# Patient Record
Sex: Male | Born: 1935 | Race: White | Hispanic: No | Marital: Single | Smoking: Former smoker
Health system: Southern US, Community
[De-identification: ages and names within clinical notes are randomized; demographics above are authoritative.]

## PROBLEM LIST (undated history)

## (undated) DIAGNOSIS — H409 Unspecified glaucoma: Secondary | ICD-10-CM

## (undated) DIAGNOSIS — I4891 Unspecified atrial fibrillation: Secondary | ICD-10-CM

## (undated) DIAGNOSIS — I10 Essential (primary) hypertension: Secondary | ICD-10-CM

## (undated) DIAGNOSIS — I499 Cardiac arrhythmia, unspecified: Secondary | ICD-10-CM

## (undated) DIAGNOSIS — M1990 Unspecified osteoarthritis, unspecified site: Secondary | ICD-10-CM

## (undated) HISTORY — DX: Unspecified atrial fibrillation: I48.91

## (undated) HISTORY — DX: Essential (primary) hypertension: I10

## (undated) HISTORY — PX: CATARACT EXTRACTION: SUR2

## (undated) HISTORY — PX: CARPAL TUNNEL RELEASE: SHX101

---

## 2008-06-10 ENCOUNTER — Emergency Department (HOSPITAL_COMMUNITY): Admission: EM | Admit: 2008-06-10 | Discharge: 2008-06-10 | Payer: Self-pay | Admitting: Emergency Medicine

## 2008-06-24 ENCOUNTER — Encounter: Admission: RE | Admit: 2008-06-24 | Discharge: 2008-06-24 | Payer: Self-pay | Admitting: Family Medicine

## 2010-07-23 LAB — POCT I-STAT, CHEM 8
Calcium, Ion: 1.18 mmol/L (ref 1.12–1.32)
Chloride: 104 mEq/L (ref 96–112)
HCT: 45 % (ref 39.0–52.0)
Hemoglobin: 15.3 g/dL (ref 13.0–17.0)
Potassium: 4 mEq/L (ref 3.5–5.1)

## 2010-07-23 LAB — DIFFERENTIAL
Basophils Absolute: 0 10*3/uL (ref 0.0–0.1)
Basophils Relative: 1 % (ref 0–1)
Eosinophils Absolute: 0.1 10*3/uL (ref 0.0–0.7)
Neutro Abs: 3.2 10*3/uL (ref 1.7–7.7)
Neutrophils Relative %: 62 % (ref 43–77)

## 2010-07-23 LAB — URINALYSIS, ROUTINE W REFLEX MICROSCOPIC
Bilirubin Urine: NEGATIVE
Ketones, ur: NEGATIVE mg/dL
Nitrite: NEGATIVE
Protein, ur: NEGATIVE mg/dL
Urobilinogen, UA: 0.2 mg/dL (ref 0.0–1.0)

## 2010-07-23 LAB — CBC
MCHC: 34.9 g/dL (ref 30.0–36.0)
MCV: 89.8 fL (ref 78.0–100.0)
RDW: 13.5 % (ref 11.5–15.5)

## 2010-10-14 IMAGING — US US CAROTID DUPLEX BILAT
1 series · 13 of 24 positions shown · non-contrast
Comparison: None.

CLINICAL DATA: Occasional dizziness, hypertension, remote history
of tobacco use

BILATERAL CAROTID DUPLEX ULTRASOUND
TECHNIQUE: Gray scale imaging, color Doppler and duplex ultrasound
was performed of bilateral carotid and vertebral arteries in the
neck.

[Series 1: us carotid duplex bilat · 0.08mm/px · 13 of 53 slices shown]
[im 1/53]
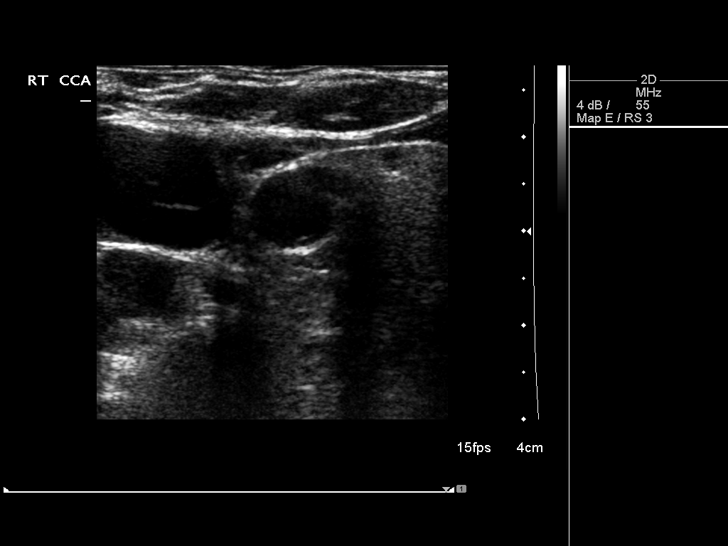
[im 5/53]
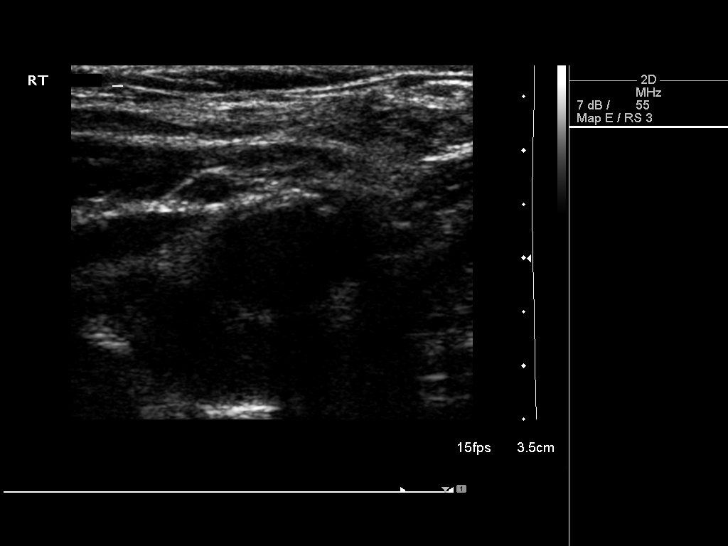
[im 10/53]
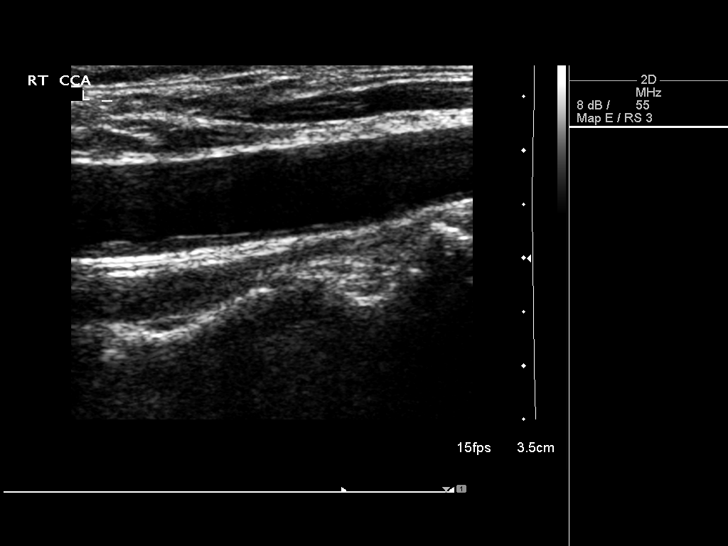
[im 14/53]
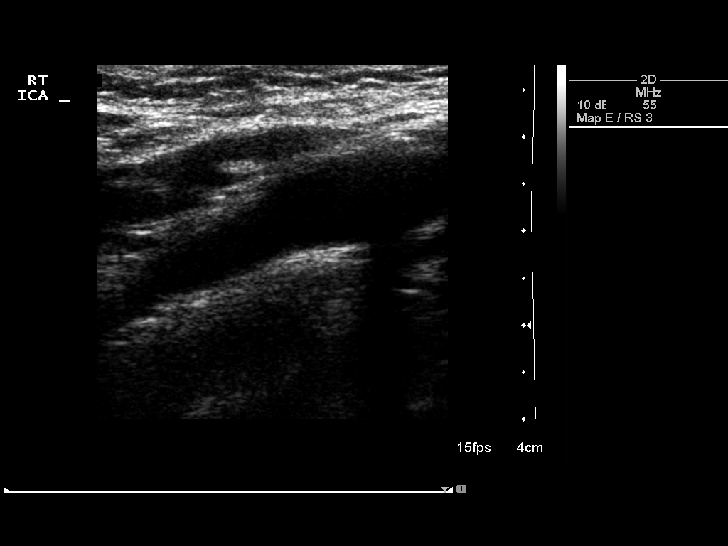
[im 19/53]
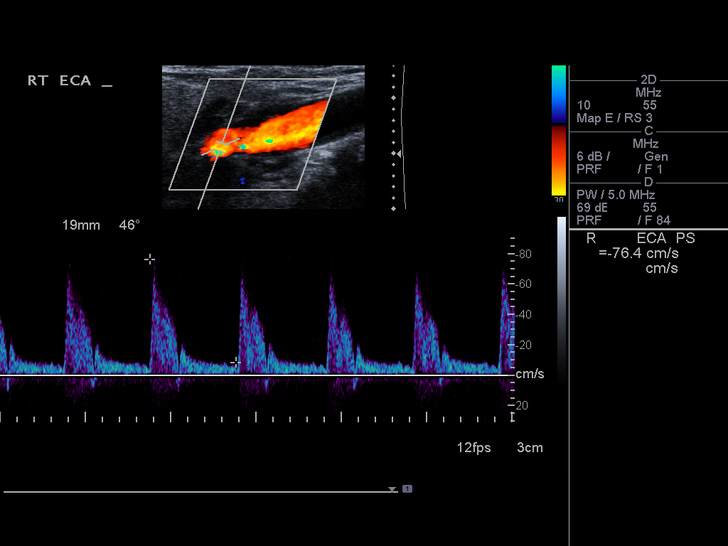
[im 23/53]
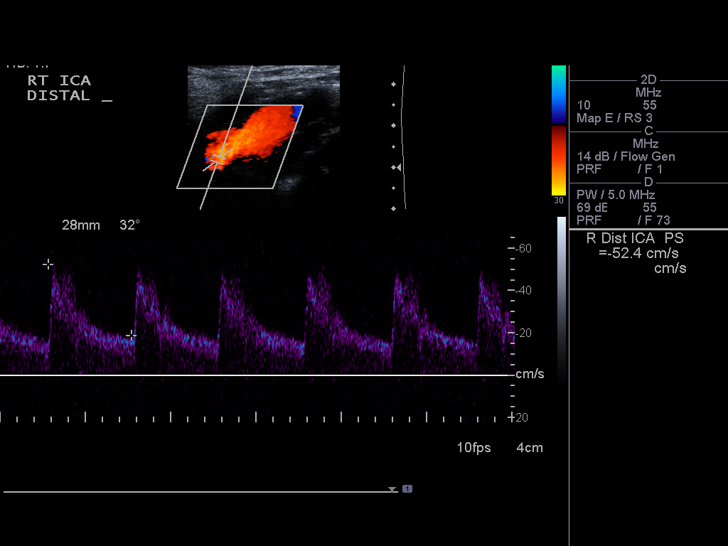
[im 28/53]
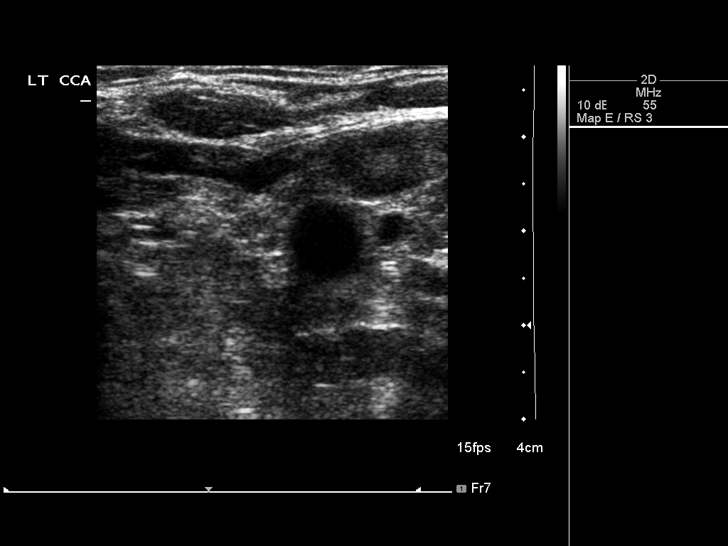
[im 30/53]
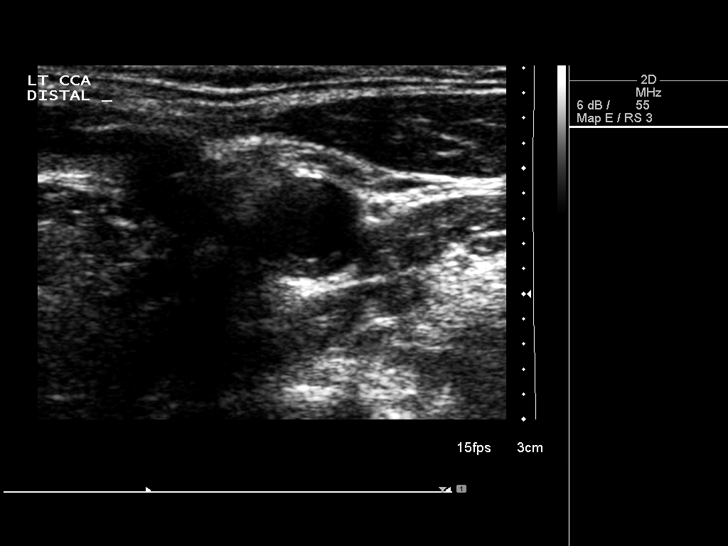
[im 34/53]
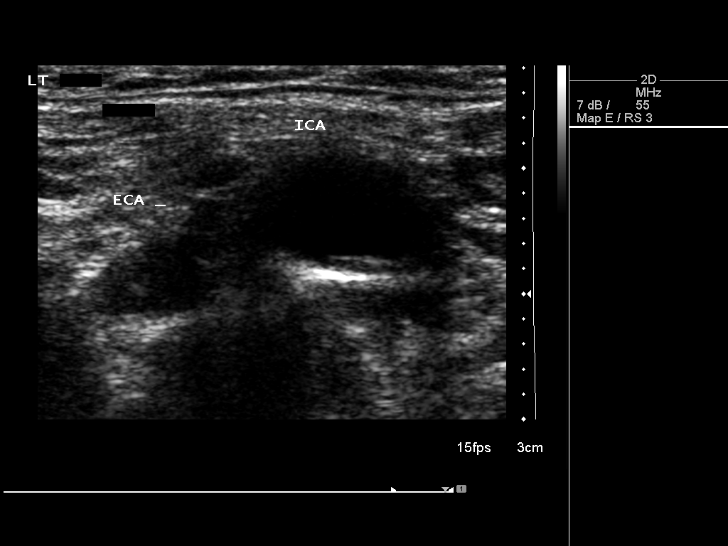
[im 39/53]
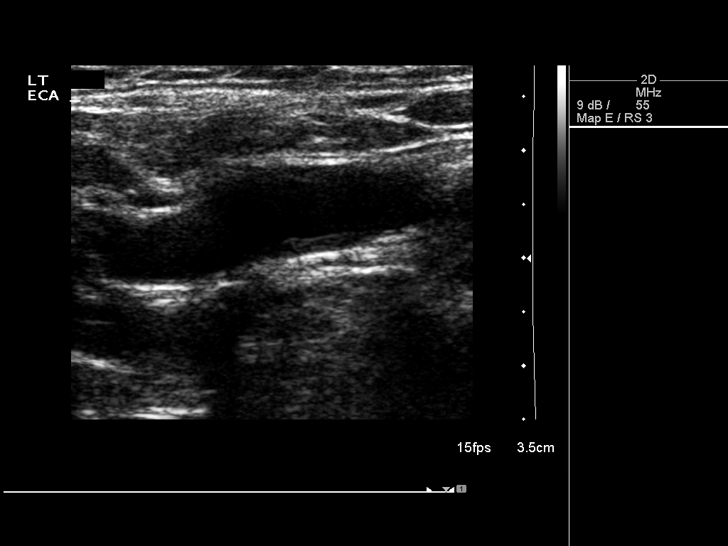
[im 43/53]
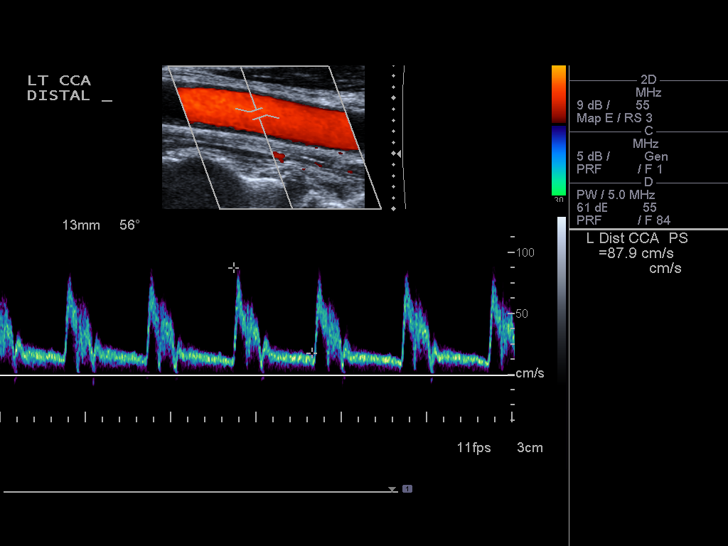
[im 48/53]
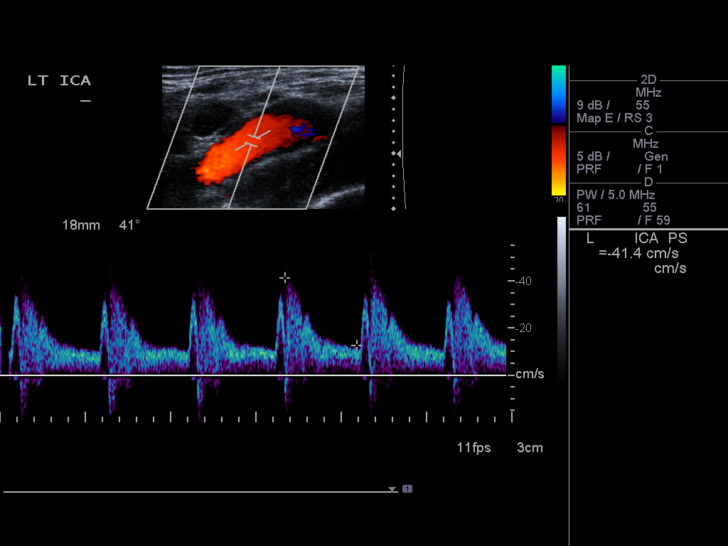
[im 53/53]
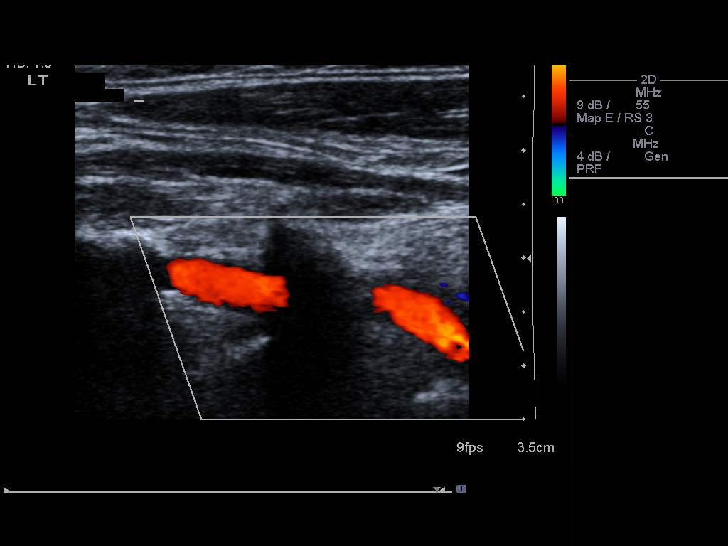

[13 of 24 positions shown; findings below may reference images not displayed]

Criteria:  Quantification of carotid stenosis is based on velocity
parameters that correlate the residual internal carotid diameter
with NASCET-based stenosis levels, using the diameter of the distal
internal carotid lumen as the denominator for stenosis measurement.

The following velocity measurements were obtained:

                 PEAK SYSTOLIC/END DIASTOLIC
RIGHT
ICA:                        60/19cm/sec
CCA:                        118/16cm/sec
SYSTOLIC ICA/CCA RATIO:
DIASTOLIC ICA/CCA RATIO:
ECA:                        76/8cm/sec

LEFT
ICA:                        57/19cm/sec
CCA:                        100/16cm/sec
SYSTOLIC ICA/CCA RATIO:
DIASTOLIC ICA/CCA RATIO:
ECA:                        48/8cm/sec
FINDINGS: RIGHT CAROTID ARTERY: Very mild right carotid bifurcation intimal
thickening and echogenic plaque formation.  No significant velocity
elevation detected.  By velocity and ratio criteria as well as gray-
scale imaging, there is no hemodynamically significant right ICA
stenosis.  Degree of narrowing is less than 50%.  Wave form
analysis demonstrates no turbulent flow or spectral broadening.

RIGHT VERTEBRAL ARTERY:  Antegrade

LEFT CAROTID ARTERY: Very minimal left carotid bifurcation intimal
thickening.  No elevated velocities detected.  By velocity and
ratio criteria as well as gray-scale imaging, there is no
hemodynamically significant left ICA stenosis.  Degree of narrowing
is also less than 50%.  Wave form analysis demonstrates no
turbulent flow or spectral broadening.

LEFT VERTEBRAL ARTERY:  Antegrade
IMPRESSION: Mild bilateral carotid atherosclerosis and intimal thickening.  No
hemodynamically significant ICA stenosis on either side.

## 2015-09-04 DIAGNOSIS — I10 Essential (primary) hypertension: Secondary | ICD-10-CM | POA: Diagnosis not present

## 2015-09-04 DIAGNOSIS — E663 Overweight: Secondary | ICD-10-CM | POA: Diagnosis not present

## 2015-09-04 DIAGNOSIS — E7800 Pure hypercholesterolemia, unspecified: Secondary | ICD-10-CM | POA: Diagnosis not present

## 2015-09-04 DIAGNOSIS — Z6829 Body mass index (BMI) 29.0-29.9, adult: Secondary | ICD-10-CM | POA: Diagnosis not present

## 2015-09-04 DIAGNOSIS — Z139 Encounter for screening, unspecified: Secondary | ICD-10-CM | POA: Diagnosis not present

## 2015-09-04 DIAGNOSIS — N529 Male erectile dysfunction, unspecified: Secondary | ICD-10-CM | POA: Diagnosis not present

## 2015-09-04 DIAGNOSIS — Z9181 History of falling: Secondary | ICD-10-CM | POA: Diagnosis not present

## 2015-09-04 DIAGNOSIS — Z125 Encounter for screening for malignant neoplasm of prostate: Secondary | ICD-10-CM | POA: Diagnosis not present

## 2016-02-06 DIAGNOSIS — H26492 Other secondary cataract, left eye: Secondary | ICD-10-CM | POA: Diagnosis not present

## 2016-02-06 DIAGNOSIS — H52203 Unspecified astigmatism, bilateral: Secondary | ICD-10-CM | POA: Diagnosis not present

## 2016-02-06 DIAGNOSIS — Z961 Presence of intraocular lens: Secondary | ICD-10-CM | POA: Diagnosis not present

## 2016-02-06 DIAGNOSIS — D3132 Benign neoplasm of left choroid: Secondary | ICD-10-CM | POA: Diagnosis not present

## 2016-03-26 DIAGNOSIS — Z1389 Encounter for screening for other disorder: Secondary | ICD-10-CM | POA: Diagnosis not present

## 2016-03-26 DIAGNOSIS — Z0000 Encounter for general adult medical examination without abnormal findings: Secondary | ICD-10-CM | POA: Diagnosis not present

## 2016-03-26 DIAGNOSIS — E7800 Pure hypercholesterolemia, unspecified: Secondary | ICD-10-CM | POA: Diagnosis not present

## 2016-03-26 DIAGNOSIS — I10 Essential (primary) hypertension: Secondary | ICD-10-CM | POA: Diagnosis not present

## 2016-10-01 DIAGNOSIS — Z139 Encounter for screening, unspecified: Secondary | ICD-10-CM | POA: Diagnosis not present

## 2016-10-01 DIAGNOSIS — E7800 Pure hypercholesterolemia, unspecified: Secondary | ICD-10-CM | POA: Diagnosis not present

## 2016-10-01 DIAGNOSIS — Z6831 Body mass index (BMI) 31.0-31.9, adult: Secondary | ICD-10-CM | POA: Diagnosis not present

## 2016-10-01 DIAGNOSIS — N529 Male erectile dysfunction, unspecified: Secondary | ICD-10-CM | POA: Diagnosis not present

## 2016-10-01 DIAGNOSIS — I10 Essential (primary) hypertension: Secondary | ICD-10-CM | POA: Diagnosis not present

## 2016-10-01 DIAGNOSIS — Z9181 History of falling: Secondary | ICD-10-CM | POA: Diagnosis not present

## 2016-11-03 DIAGNOSIS — Z9181 History of falling: Secondary | ICD-10-CM | POA: Diagnosis not present

## 2016-11-03 DIAGNOSIS — Z0000 Encounter for general adult medical examination without abnormal findings: Secondary | ICD-10-CM | POA: Diagnosis not present

## 2016-11-03 DIAGNOSIS — Z6831 Body mass index (BMI) 31.0-31.9, adult: Secondary | ICD-10-CM | POA: Diagnosis not present

## 2016-11-03 DIAGNOSIS — E785 Hyperlipidemia, unspecified: Secondary | ICD-10-CM | POA: Diagnosis not present

## 2016-11-03 DIAGNOSIS — Z125 Encounter for screening for malignant neoplasm of prostate: Secondary | ICD-10-CM | POA: Diagnosis not present

## 2016-11-03 DIAGNOSIS — Z1389 Encounter for screening for other disorder: Secondary | ICD-10-CM | POA: Diagnosis not present

## 2016-11-09 DIAGNOSIS — M170 Bilateral primary osteoarthritis of knee: Secondary | ICD-10-CM | POA: Diagnosis not present

## 2016-12-14 DIAGNOSIS — M542 Cervicalgia: Secondary | ICD-10-CM | POA: Diagnosis not present

## 2016-12-14 DIAGNOSIS — G5601 Carpal tunnel syndrome, right upper limb: Secondary | ICD-10-CM | POA: Diagnosis not present

## 2016-12-14 DIAGNOSIS — S46011A Strain of muscle(s) and tendon(s) of the rotator cuff of right shoulder, initial encounter: Secondary | ICD-10-CM | POA: Diagnosis not present

## 2017-01-13 DIAGNOSIS — G5601 Carpal tunnel syndrome, right upper limb: Secondary | ICD-10-CM | POA: Diagnosis not present

## 2017-01-19 DIAGNOSIS — I10 Essential (primary) hypertension: Secondary | ICD-10-CM | POA: Diagnosis not present

## 2017-02-11 DIAGNOSIS — H26492 Other secondary cataract, left eye: Secondary | ICD-10-CM | POA: Diagnosis not present

## 2017-02-11 DIAGNOSIS — D3132 Benign neoplasm of left choroid: Secondary | ICD-10-CM | POA: Diagnosis not present

## 2017-03-17 DIAGNOSIS — H26492 Other secondary cataract, left eye: Secondary | ICD-10-CM | POA: Diagnosis not present

## 2017-04-28 DIAGNOSIS — G5601 Carpal tunnel syndrome, right upper limb: Secondary | ICD-10-CM | POA: Diagnosis not present

## 2017-07-22 DIAGNOSIS — E7800 Pure hypercholesterolemia, unspecified: Secondary | ICD-10-CM | POA: Diagnosis not present

## 2017-07-22 DIAGNOSIS — I10 Essential (primary) hypertension: Secondary | ICD-10-CM | POA: Diagnosis not present

## 2017-07-22 DIAGNOSIS — Z6829 Body mass index (BMI) 29.0-29.9, adult: Secondary | ICD-10-CM | POA: Diagnosis not present

## 2018-01-24 DIAGNOSIS — Z1331 Encounter for screening for depression: Secondary | ICD-10-CM | POA: Diagnosis not present

## 2018-01-24 DIAGNOSIS — Z139 Encounter for screening, unspecified: Secondary | ICD-10-CM | POA: Diagnosis not present

## 2018-01-24 DIAGNOSIS — I10 Essential (primary) hypertension: Secondary | ICD-10-CM | POA: Diagnosis not present

## 2018-01-24 DIAGNOSIS — Z1339 Encounter for screening examination for other mental health and behavioral disorders: Secondary | ICD-10-CM | POA: Diagnosis not present

## 2018-01-24 DIAGNOSIS — Z9181 History of falling: Secondary | ICD-10-CM | POA: Diagnosis not present

## 2018-01-24 DIAGNOSIS — Z6830 Body mass index (BMI) 30.0-30.9, adult: Secondary | ICD-10-CM | POA: Diagnosis not present

## 2018-01-24 DIAGNOSIS — E7800 Pure hypercholesterolemia, unspecified: Secondary | ICD-10-CM | POA: Diagnosis not present

## 2018-02-28 DIAGNOSIS — M170 Bilateral primary osteoarthritis of knee: Secondary | ICD-10-CM | POA: Diagnosis not present

## 2018-04-11 DIAGNOSIS — Z6830 Body mass index (BMI) 30.0-30.9, adult: Secondary | ICD-10-CM | POA: Diagnosis not present

## 2018-04-11 DIAGNOSIS — J069 Acute upper respiratory infection, unspecified: Secondary | ICD-10-CM | POA: Diagnosis not present

## 2018-08-31 DIAGNOSIS — I10 Essential (primary) hypertension: Secondary | ICD-10-CM | POA: Diagnosis not present

## 2018-08-31 DIAGNOSIS — E7800 Pure hypercholesterolemia, unspecified: Secondary | ICD-10-CM | POA: Diagnosis not present

## 2018-08-31 DIAGNOSIS — M1710 Unilateral primary osteoarthritis, unspecified knee: Secondary | ICD-10-CM | POA: Diagnosis not present

## 2018-08-31 DIAGNOSIS — Z6830 Body mass index (BMI) 30.0-30.9, adult: Secondary | ICD-10-CM | POA: Diagnosis not present

## 2018-08-31 DIAGNOSIS — N529 Male erectile dysfunction, unspecified: Secondary | ICD-10-CM | POA: Diagnosis not present

## 2018-08-31 DIAGNOSIS — E669 Obesity, unspecified: Secondary | ICD-10-CM | POA: Diagnosis not present

## 2019-05-04 DIAGNOSIS — H52203 Unspecified astigmatism, bilateral: Secondary | ICD-10-CM | POA: Diagnosis not present

## 2019-05-04 DIAGNOSIS — D3132 Benign neoplasm of left choroid: Secondary | ICD-10-CM | POA: Diagnosis not present

## 2019-05-04 DIAGNOSIS — Z961 Presence of intraocular lens: Secondary | ICD-10-CM | POA: Diagnosis not present

## 2019-07-29 DIAGNOSIS — J019 Acute sinusitis, unspecified: Secondary | ICD-10-CM | POA: Diagnosis not present

## 2019-07-29 DIAGNOSIS — J209 Acute bronchitis, unspecified: Secondary | ICD-10-CM | POA: Diagnosis not present

## 2019-08-16 DIAGNOSIS — I10 Essential (primary) hypertension: Secondary | ICD-10-CM | POA: Diagnosis not present

## 2019-08-16 DIAGNOSIS — Z1331 Encounter for screening for depression: Secondary | ICD-10-CM | POA: Diagnosis not present

## 2019-08-16 DIAGNOSIS — E7800 Pure hypercholesterolemia, unspecified: Secondary | ICD-10-CM | POA: Diagnosis not present

## 2019-08-16 DIAGNOSIS — Z6828 Body mass index (BMI) 28.0-28.9, adult: Secondary | ICD-10-CM | POA: Diagnosis not present

## 2019-08-16 DIAGNOSIS — Z9181 History of falling: Secondary | ICD-10-CM | POA: Diagnosis not present

## 2019-08-16 DIAGNOSIS — M1710 Unilateral primary osteoarthritis, unspecified knee: Secondary | ICD-10-CM | POA: Diagnosis not present

## 2019-08-16 DIAGNOSIS — N529 Male erectile dysfunction, unspecified: Secondary | ICD-10-CM | POA: Diagnosis not present

## 2019-08-16 DIAGNOSIS — Z139 Encounter for screening, unspecified: Secondary | ICD-10-CM | POA: Diagnosis not present

## 2020-02-18 DIAGNOSIS — E7800 Pure hypercholesterolemia, unspecified: Secondary | ICD-10-CM | POA: Diagnosis not present

## 2020-02-18 DIAGNOSIS — N529 Male erectile dysfunction, unspecified: Secondary | ICD-10-CM | POA: Diagnosis not present

## 2020-02-18 DIAGNOSIS — Z6829 Body mass index (BMI) 29.0-29.9, adult: Secondary | ICD-10-CM | POA: Diagnosis not present

## 2020-02-18 DIAGNOSIS — M1710 Unilateral primary osteoarthritis, unspecified knee: Secondary | ICD-10-CM | POA: Diagnosis not present

## 2020-02-18 DIAGNOSIS — I10 Essential (primary) hypertension: Secondary | ICD-10-CM | POA: Diagnosis not present

## 2020-05-07 DIAGNOSIS — H52203 Unspecified astigmatism, bilateral: Secondary | ICD-10-CM | POA: Diagnosis not present

## 2020-05-07 DIAGNOSIS — H26491 Other secondary cataract, right eye: Secondary | ICD-10-CM | POA: Diagnosis not present

## 2020-05-07 DIAGNOSIS — D3132 Benign neoplasm of left choroid: Secondary | ICD-10-CM | POA: Diagnosis not present

## 2020-05-07 DIAGNOSIS — H524 Presbyopia: Secondary | ICD-10-CM | POA: Diagnosis not present

## 2020-08-19 DIAGNOSIS — Z1331 Encounter for screening for depression: Secondary | ICD-10-CM | POA: Diagnosis not present

## 2020-08-19 DIAGNOSIS — Z139 Encounter for screening, unspecified: Secondary | ICD-10-CM | POA: Diagnosis not present

## 2020-08-19 DIAGNOSIS — M1710 Unilateral primary osteoarthritis, unspecified knee: Secondary | ICD-10-CM | POA: Diagnosis not present

## 2020-08-19 DIAGNOSIS — N529 Male erectile dysfunction, unspecified: Secondary | ICD-10-CM | POA: Diagnosis not present

## 2020-08-19 DIAGNOSIS — Z6829 Body mass index (BMI) 29.0-29.9, adult: Secondary | ICD-10-CM | POA: Diagnosis not present

## 2020-08-19 DIAGNOSIS — I10 Essential (primary) hypertension: Secondary | ICD-10-CM | POA: Diagnosis not present

## 2020-08-19 DIAGNOSIS — E7800 Pure hypercholesterolemia, unspecified: Secondary | ICD-10-CM | POA: Diagnosis not present

## 2020-08-19 DIAGNOSIS — Z9181 History of falling: Secondary | ICD-10-CM | POA: Diagnosis not present

## 2021-02-24 DIAGNOSIS — I10 Essential (primary) hypertension: Secondary | ICD-10-CM | POA: Diagnosis not present

## 2021-02-24 DIAGNOSIS — M179 Osteoarthritis of knee, unspecified: Secondary | ICD-10-CM | POA: Diagnosis not present

## 2021-02-24 DIAGNOSIS — Z6829 Body mass index (BMI) 29.0-29.9, adult: Secondary | ICD-10-CM | POA: Diagnosis not present

## 2021-02-24 DIAGNOSIS — E7800 Pure hypercholesterolemia, unspecified: Secondary | ICD-10-CM | POA: Diagnosis not present

## 2021-02-24 DIAGNOSIS — R413 Other amnesia: Secondary | ICD-10-CM | POA: Diagnosis not present

## 2021-05-15 DIAGNOSIS — D3132 Benign neoplasm of left choroid: Secondary | ICD-10-CM | POA: Diagnosis not present

## 2021-05-15 DIAGNOSIS — H26491 Other secondary cataract, right eye: Secondary | ICD-10-CM | POA: Diagnosis not present

## 2021-05-15 DIAGNOSIS — Z961 Presence of intraocular lens: Secondary | ICD-10-CM | POA: Diagnosis not present

## 2021-05-15 DIAGNOSIS — H52203 Unspecified astigmatism, bilateral: Secondary | ICD-10-CM | POA: Diagnosis not present

## 2021-05-27 DIAGNOSIS — H26491 Other secondary cataract, right eye: Secondary | ICD-10-CM | POA: Diagnosis not present

## 2021-07-27 DIAGNOSIS — M170 Bilateral primary osteoarthritis of knee: Secondary | ICD-10-CM | POA: Diagnosis not present

## 2021-08-13 DIAGNOSIS — M170 Bilateral primary osteoarthritis of knee: Secondary | ICD-10-CM | POA: Diagnosis not present

## 2021-08-31 DIAGNOSIS — I4891 Unspecified atrial fibrillation: Secondary | ICD-10-CM | POA: Diagnosis not present

## 2021-08-31 DIAGNOSIS — I10 Essential (primary) hypertension: Secondary | ICD-10-CM | POA: Diagnosis not present

## 2021-08-31 DIAGNOSIS — M179 Osteoarthritis of knee, unspecified: Secondary | ICD-10-CM | POA: Diagnosis not present

## 2021-08-31 DIAGNOSIS — E7800 Pure hypercholesterolemia, unspecified: Secondary | ICD-10-CM | POA: Diagnosis not present

## 2021-09-03 ENCOUNTER — Telehealth: Payer: Self-pay

## 2021-09-03 NOTE — Telephone Encounter (Signed)
NOTES SCANNED TO REFERRAL 

## 2021-09-17 ENCOUNTER — Ambulatory Visit (INDEPENDENT_AMBULATORY_CARE_PROVIDER_SITE_OTHER): Payer: PPO | Admitting: Cardiovascular Disease

## 2021-09-17 ENCOUNTER — Encounter: Payer: Self-pay | Admitting: Cardiovascular Disease

## 2021-09-17 VITALS — BP 162/80 | HR 103 | Ht 66.0 in | Wt 177.8 lb

## 2021-09-17 DIAGNOSIS — I4819 Other persistent atrial fibrillation: Secondary | ICD-10-CM | POA: Diagnosis not present

## 2021-09-17 DIAGNOSIS — I4510 Unspecified right bundle-branch block: Secondary | ICD-10-CM | POA: Diagnosis not present

## 2021-09-17 DIAGNOSIS — I10 Essential (primary) hypertension: Secondary | ICD-10-CM | POA: Diagnosis not present

## 2021-09-17 DIAGNOSIS — D6869 Other thrombophilia: Secondary | ICD-10-CM | POA: Diagnosis not present

## 2021-09-17 MED ORDER — METOPROLOL SUCCINATE ER 25 MG PO TB24: 25.0000 mg | ORAL_TABLET | Freq: Every day | ORAL | 1 refills | Status: DC

## 2021-09-17 NOTE — Progress Notes (Signed)
Cardiology Office Note:    Date:  09/18/2021   ID:  Jonathan Woodward, DOB [birth year], MRN [id number]  PCP:  Jonathan Peak, PA-C   [facility] Providers Cardiologist:  Jonathan Fair, MD     Referring MD: Jonathan Peak, PA-C   Chief Complaint  Patient presents with   Atrial Fibrillation  Jonathan Woodward is a 88 y.o. male who is being seen today for the evaluation of newly diagnosed atrial fibrillation at the request of Jonathan Woodward, Jonathan Woodward.   History of Present Illness:    Jonathan Woodward is a 88 y.o. male with a hx of generally excellent health, mild hypertension, knee osteoarthritis, history of carpal tunnel syndrome and surgery for cataracts who was incidentally found to have atrial fibrillation during her recent appointment with his primary care provider.  At her visit 6 months earlier his rhythm was felt to be normal.  He is completely unaware of the arrhythmia.  He is remarkably physically active for his age.  He does a lot of yard work, cutting down trees and clearing limbs.  He lives independently with his 86 year old significant other on a large property.  He had COVID-19 infection about a year ago and has noticed that he has mild exertional dyspnea (NYHA functional class II) since then.  The patient specifically denies any chest pain at rest or with exertion, dyspnea at rest or with usual exertion, orthopnea, paroxysmal nocturnal dyspnea, syncope, palpitations, focal neurological deficits, intermittent claudication, lower extremity edema, unexplained weight gain, cough, hemoptysis or wheezing.  He has not had recent weight loss, tremor, heat intolerance or other symptoms of hyper or hypothyroidism.  He was recently started on Eliquis maybe 2 weeks ago.  He has not had any bleeding problems since initiation.  He does not have a history of falls or serious injuries.  Past Medical History:  Diagnosis Date   Atrial fibrillation (HCC)    HTN (hypertension)      Past Surgical History:  Procedure Laterality Date   CARPAL TUNNEL RELEASE     CATARACT EXTRACTION      Current Medications: Current Meds  Medication Sig   diphenhydrAMINE-APAP, sleep, (TYLENOL PM EXTRA STRENGTH PO) Take 1 tablet by mouth daily as needed.   ELIQUIS 5 MG TABS tablet Take 5 mg by mouth 2 (two) times daily.   Flaxseed, Linseed, (FLAXSEED OIL) 1200 MG CAPS Take 1,200 mg by mouth in the morning.   losartan (COZAAR) 50 MG tablet Take 50 mg by mouth 2 (two) times daily.   NON FORMULARY Take 1 tablet by mouth in the morning and at bedtime.   Omega-3 Fatty Acids (FISH OIL) 1000 MG CAPS Take 1,000 mg by mouth in the morning.   sennosides-docusate sodium (SENOKOT-S) 8.6-50 MG tablet Take 1 tablet by mouth as needed for constipation.   [DISCONTINUED] metoprolol succinate (TOPROL-XL) 25 MG 24 hr tablet Take 12.5 mg by mouth daily.     Allergies:   Patient has no allergy information on record.   Social History   Socioeconomic History   Marital status: Single    Spouse name: Not on file   Number of children: Not on file   Years of education: Not on file   Highest education level: Not on file  Occupational History   Not on file  Tobacco Use   Smoking status: Former    Types: Cigarettes, Pipe, Cigars    Quit date: 1980    Years since quitting: 43.4   Smokeless tobacco: Former  Types: Chew, Snuff    Quit date: 44  Substance and Sexual Activity   Alcohol use: Not on file   Drug use: Not on file   Sexual activity: Not on file  Other Topics Concern   Not on file  Social History Narrative   Not on file   Social Determinants of Health   Financial Resource Strain: Not on file  Food Insecurity: Not on file  Transportation Needs: Not on file  Physical Activity: Not on file  Stress: Not on file  Social Connections: Not on file     Family History: The patient's family history is significantly negative for cardiac illness or stroke  ROS:   Please see the  history of present illness.     All other systems reviewed and are negative.  EKGs/Labs/Other Studies Reviewed:    The following studies were reviewed today:   EKG:  EKG is ordered today.  The ekg ordered today demonstrates atrial fibrillation with mild RVR, RBBB, no repolarization abnormalities, QTc 463 ms.  Recent Labs: No results found for requested labs within last 365 days.  Recent Lipid Panel No results found for: "CHOL", "TRIG", "HDL", "CHOLHDL", "VLDL", "LDLCALC", "LDLDIRECT"   Risk Assessment/Calculations:    CHA2DS2-VASc Score = 3   This indicates a 3.2% annual risk of stroke. The patient's score is based upon: CHF History: 0 HTN History: 1 Diabetes History: 0 Stroke History: 0 Vascular Disease History: 0 Age Score: 2 Gender Score: 0          Physical Exam:    VS:  BP (!) 162/80 (BP Location: Left Arm, Patient Position: Sitting, Cuff Size: Normal)   Pulse (!) 103   Ht 5\' 6"  (1.676 m)   Wt 177 lb 12.8 oz (80.6 kg)   SpO2 94%   BMI 28.70 kg/m     Wt Readings from Last 3 Encounters:  09/17/21 177 lb 12.8 oz (80.6 kg)     GEN: Appears fit and younger than stated age well nourished, well developed in no acute distress HEENT: Normal NECK: No JVD; No carotid bruits LYMPHATICS: No lymphadenopathy CARDIAC: Irregular, widely split second heart sound, normal first heart sound, no murmurs, rubs, gallops RESPIRATORY:  Clear to auscultation without rales, wheezing or rhonchi  ABDOMEN: Soft, non-tender, non-distended MUSCULOSKELETAL:  No edema; No deformity  SKIN: Warm and dry NEUROLOGIC:  Alert and oriented x 3 PSYCHIATRIC:  Normal affect   ASSESSMENT:    1. Atrial fibrillation, unspecified type (HCC)    PLAN:    In order of problems listed above:  AFib: Exact duration uncertain, but apparently less than 6 months.  He is arrhythmia unaware.  Started anticoagulation 1 or 2 weeks ago.  He does have mild exertional dyspnea, but is much more active than the  average 86 years old.  Scheduled for echocardiogram and after that for direct-current cardioversion (after he has been on uninterrupted anticoagulation for at least 3 weeks).  It is conceivable that his dyspnea is simply coincidental to the COVID-19 infection and it could be due to the atrial fibrillation.  If there is symptomatic improvement following cardioversion we may discuss more aggressive attempts at maintaining normal rhythm, but if functional status does not change with restoration of normal rhythm, would focus on a rate control approach at age 49. HTN: Blood pressure is elevated today, but this level is unusual for him.  Just a few weeks ago his blood pressure was still little high at 146/78.  We will increase the dose  of metoprolol to a full 25 mg daily.  This will help both with his blood pressure and with better rate control. RBBB: Unknown chronicity.  May not be clinically significant.  Check echocardiogram for signs of right heart strain.  He does not have symptoms of sleep apnea.  He smoked for a while, but quit 40 years ago and does not have typical findings of COPD. Anticoagulation: Well-tolerated, no bleeding problems so far. History of carpal tunnel release: No other findings to raise suspicion for amyloidosis.  Normal voltage on ECG.  Does not have symptoms of heart failure.      Shared Decision Making/Informed Consent The risks (stroke, cardiac arrhythmias rarely resulting in the need for a temporary or permanent pacemaker, skin irritation or burns and complications associated with conscious sedation including aspiration, arrhythmia, respiratory failure and death), benefits (restoration of normal sinus rhythm) and alternatives of a direct current cardioversion were explained in detail to Jonathan Woodward and he agrees to proceed.      Medication Adjustments/Labs and Tests Ordered: Current medicines are reviewed at length with the patient today.  Concerns regarding medicines are  outlined above.  Orders Placed This Encounter  Procedures   Basic metabolic panel   CBC   EKG 12-Lead   ECHOCARDIOGRAM COMPLETE   Meds ordered this encounter  Medications   metoprolol succinate (TOPROL-XL) 25 MG 24 hr tablet    Sig: Take 1 tablet (25 mg total) by mouth daily.    Dispense:  90 tablet    Refill:  1    Patient Instructions  Medication Instructions:  INCREASE the Metoprolol Succinate to 25 mg once daily  *If you need a refill on your cardiac medications before your next appointment, please call your pharmacy*  Testing/Procedures: Your physician has requested that you have an echocardiogram within the next 2 weeks Echocardiography is a painless test that uses sound waves to create images of your heart. It provides your doctor with information about the size and shape of your heart and how well your heart's chambers and valves are working. You may receive an ultrasound enhancing agent through an IV if needed to better visualize your heart during the echo.This procedure takes approximately one hour. There are no restrictions for this procedure. This will take place at the [address].    Follow-Up: At [facility], you and your health needs are our priority.  As part of our continuing mission to provide you with exceptional heart care, we have created designated Provider Care Teams.  These Care Teams include your primary Cardiologist (physician) and Advanced Practice Providers (APPs -  Physician Assistants and Nurse Practitioners) who all work together to provide you with the care you need, when you need it.  We recommend signing up for the patient portal called "MyChart".  Sign up information is provided on this After Visit Summary.  MyChart is used to connect with patients for Virtual Visits (Telemedicine).  Patients are able to view lab/test results, encounter notes, upcoming appointments, etc.  Non-urgent messages can be sent to your provider as well.    To learn more about what you can do with MyChart, go to [email or web address].    Your next appointment:   6 week(s)  The format for your next appointment:   In Person  Provider:   Thurmon Fair, MD {   Other Instructions  You are scheduled for a Cardioversion on 10/16/21 with Dr. Rennis Golden.  Please arrive at the [facility] (Main Entrance A)  at [facility]: [address] [place], [place] [address] at 7 am (1 hour prior to procedure unless lab work is needed)  DIET: Nothing to eat or drink after midnight except a sip of water with medications (see medication instructions below)  FYI: For your safety, and to allow [place] to monitor your vital signs accurately during the surgery/procedure we request that   if you have artificial nails, gel coating, SNS etc. Please have those removed prior to your surgery/procedure. Not having the nail coverings /polish removed may result in cancellation or delay of your surgery/procedure.   Medication Instructions: Hold: Nothing to hold  Continue your anticoagulant: Eliquis You will need to continue your anticoagulant after your procedure until you  are told by your  Provider that it is safe to stop   Labs:  Your provider would like for you to return on 6/30 to have the following labs drawn: BMET and CBC. You do not need an appointment for the lab. Once in our office lobby there is a podium where you can sign in and ring the doorbell to alert [place] that you are here. The lab is open from 8:00 am to 4 pm; closed for lunch from 12:45pm-1:45pm.  You may also go to any of these [facility] locations:   [facility] - [address] ([facility]) - [address] [phone] [address]   You must have a responsible person to drive you home and stay in the waiting area during your procedure. Failure to do so could result in cancellation.  Bring your insurance cards.  *Special Note: Every effort is made  to have your procedure done on time. Occasionally there are emergencies that occur at the hospital that may cause delays. Please be patient if a delay does occur.       Signed, Jonathan Fair, MD  09/18/2021 12:03 PM    [facility]

## 2021-09-17 NOTE — H&P (View-Only) (Signed)
Cardiology Office Note:    Date:  09/18/2021   ID:  Jonathan Woodward, DOB [birth year], MRN [id number]  PCP:  Lonie Peak, PA-C   [facility] Providers Cardiologist:  Thurmon Fair, MD     Referring MD: Lonie Peak, PA-C   Chief Complaint  Patient presents with   Atrial Fibrillation  Jonathan Woodward is a 88 y.o. male who is being seen today for the evaluation of newly diagnosed atrial fibrillation at the request of Lonie Peak, Cordelia Poche.   History of Present Illness:    Jonathan Woodward is a 88 y.o. male with a hx of generally excellent health, mild hypertension, knee osteoarthritis, history of carpal tunnel syndrome and surgery for cataracts who was incidentally found to have atrial fibrillation during her recent appointment with his primary care provider.  At her visit 6 months earlier his rhythm was felt to be normal.  He is completely unaware of the arrhythmia.  He is remarkably physically active for his age.  He does a lot of yard work, cutting down trees and clearing limbs.  He lives independently with his 86 year old significant other on a large property.  He had COVID-19 infection about a year ago and has noticed that he has mild exertional dyspnea (NYHA functional class II) since then.  The patient specifically denies any chest pain at rest or with exertion, dyspnea at rest or with usual exertion, orthopnea, paroxysmal nocturnal dyspnea, syncope, palpitations, focal neurological deficits, intermittent claudication, lower extremity edema, unexplained weight gain, cough, hemoptysis or wheezing.  He has not had recent weight loss, tremor, heat intolerance or other symptoms of hyper or hypothyroidism.  He was recently started on Eliquis maybe 2 weeks ago.  He has not had any bleeding problems since initiation.  He does not have a history of falls or serious injuries.  Past Medical History:  Diagnosis Date   Atrial fibrillation (HCC)    HTN (hypertension)      Past Surgical History:  Procedure Laterality Date   CARPAL TUNNEL RELEASE     CATARACT EXTRACTION      Current Medications: Current Meds  Medication Sig   diphenhydrAMINE-APAP, sleep, (TYLENOL PM EXTRA STRENGTH PO) Take 1 tablet by mouth daily as needed.   ELIQUIS 5 MG TABS tablet Take 5 mg by mouth 2 (two) times daily.   Flaxseed, Linseed, (FLAXSEED OIL) 1200 MG CAPS Take 1,200 mg by mouth in the morning.   losartan (COZAAR) 50 MG tablet Take 50 mg by mouth 2 (two) times daily.   NON FORMULARY Take 1 tablet by mouth in the morning and at bedtime.   Omega-3 Fatty Acids (FISH OIL) 1000 MG CAPS Take 1,000 mg by mouth in the morning.   sennosides-docusate sodium (SENOKOT-S) 8.6-50 MG tablet Take 1 tablet by mouth as needed for constipation.   [DISCONTINUED] metoprolol succinate (TOPROL-XL) 25 MG 24 hr tablet Take 12.5 mg by mouth daily.     Allergies:   Patient has no allergy information on record.   Social History   Socioeconomic History   Marital status: Single    Spouse name: Not on file   Number of children: Not on file   Years of education: Not on file   Highest education level: Not on file  Occupational History   Not on file  Tobacco Use   Smoking status: Former    Types: Cigarettes, Pipe, Cigars    Quit date: 1980    Years since quitting: 43.4   Smokeless tobacco: Former  Types: Chew, Snuff    Quit date: 42  Substance and Sexual Activity   Alcohol use: Not on file   Drug use: Not on file   Sexual activity: Not on file  Other Topics Concern   Not on file  Social History Narrative   Not on file   Social Determinants of Health   Financial Resource Strain: Not on file  Food Insecurity: Not on file  Transportation Needs: Not on file  Physical Activity: Not on file  Stress: Not on file  Social Connections: Not on file     Family History: The patient's family history is significantly negative for cardiac illness or stroke  ROS:   Please see the  history of present illness.     All other systems reviewed and are negative.  EKGs/Labs/Other Studies Reviewed:    The following studies were reviewed today:   EKG:  EKG is ordered today.  The ekg ordered today demonstrates atrial fibrillation with mild RVR, RBBB, no repolarization abnormalities, QTc 463 ms.  Recent Labs: No results found for requested labs within last 365 days.  Recent Lipid Panel No results found for: "CHOL", "TRIG", "HDL", "CHOLHDL", "VLDL", "LDLCALC", "LDLDIRECT"   Risk Assessment/Calculations:    CHA2DS2-VASc Score = 3   This indicates a 3.2% annual risk of stroke. The patient's score is based upon: CHF History: 0 HTN History: 1 Diabetes History: 0 Stroke History: 0 Vascular Disease History: 0 Age Score: 2 Gender Score: 0          Physical Exam:    VS:  BP (!) 162/80 (BP Location: Left Arm, Patient Position: Sitting, Cuff Size: Normal)   Pulse (!) 103   Ht 5\' 6"  (1.676 m)   Wt 177 lb 12.8 oz (80.6 kg)   SpO2 94%   BMI 28.70 kg/m     Wt Readings from Last 3 Encounters:  09/17/21 177 lb 12.8 oz (80.6 kg)     GEN: Appears fit and younger than stated age well nourished, well developed in no acute distress HEENT: Normal NECK: No JVD; No carotid bruits LYMPHATICS: No lymphadenopathy CARDIAC: Irregular, widely split second heart sound, normal first heart sound, no murmurs, rubs, gallops RESPIRATORY:  Clear to auscultation without rales, wheezing or rhonchi  ABDOMEN: Soft, non-tender, non-distended MUSCULOSKELETAL:  No edema; No deformity  SKIN: Warm and dry NEUROLOGIC:  Alert and oriented x 3 PSYCHIATRIC:  Normal affect   ASSESSMENT:    1. Atrial fibrillation, unspecified type (HCC)    PLAN:    In order of problems listed above:  AFib: Exact duration uncertain, but apparently less than 6 months.  He is arrhythmia unaware.  Started anticoagulation 1 or 2 weeks ago.  He does have mild exertional dyspnea, but is much more active than the  average 86 years old.  Scheduled for echocardiogram and after that for direct-current cardioversion (after he has been on uninterrupted anticoagulation for at least 3 weeks).  It is conceivable that his dyspnea is simply coincidental to the COVID-19 infection and it could be due to the atrial fibrillation.  If there is symptomatic improvement following cardioversion we may discuss more aggressive attempts at maintaining normal rhythm, but if functional status does not change with restoration of normal rhythm, would focus on a rate control approach at age 49. HTN: Blood pressure is elevated today, but this level is unusual for him.  Just a few weeks ago his blood pressure was still little high at 146/78.  We will increase the dose  of metoprolol to a full 25 mg daily.  This will help both with his blood pressure and with better rate control. RBBB: Unknown chronicity.  May not be clinically significant.  Check echocardiogram for signs of right heart strain.  He does not have symptoms of sleep apnea.  He smoked for a while, but quit 40 years ago and does not have typical findings of COPD. Anticoagulation: Well-tolerated, no bleeding problems so far. History of carpal tunnel release: No other findings to raise suspicion for amyloidosis.  Normal voltage on ECG.  Does not have symptoms of heart failure.      Shared Decision Making/Informed Consent The risks (stroke, cardiac arrhythmias rarely resulting in the need for a temporary or permanent pacemaker, skin irritation or burns and complications associated with conscious sedation including aspiration, arrhythmia, respiratory failure and death), benefits (restoration of normal sinus rhythm) and alternatives of a direct current cardioversion were explained in detail to Jonathan Woodward and he agrees to proceed.      Medication Adjustments/Labs and Tests Ordered: Current medicines are reviewed at length with the patient today.  Concerns regarding medicines are  outlined above.  Orders Placed This Encounter  Procedures   Basic metabolic panel   CBC   EKG 12-Lead   ECHOCARDIOGRAM COMPLETE   Meds ordered this encounter  Medications   metoprolol succinate (TOPROL-XL) 25 MG 24 hr tablet    Sig: Take 1 tablet (25 mg total) by mouth daily.    Dispense:  90 tablet    Refill:  1    Patient Instructions  Medication Instructions:  INCREASE the Metoprolol Succinate to 25 mg once daily  *If you need a refill on your cardiac medications before your next appointment, please call your pharmacy*  Testing/Procedures: Your physician has requested that you have an echocardiogram within the next 2 weeks Echocardiography is a painless test that uses sound waves to create images of your heart. It provides your doctor with information about the size and shape of your heart and how well your heart's chambers and valves are working. You may receive an ultrasound enhancing agent through an IV if needed to better visualize your heart during the echo.This procedure takes approximately one hour. There are no restrictions for this procedure. This will take place at the [address].    Follow-Up: At [facility], you and your health needs are our priority.  As part of our continuing mission to provide you with exceptional heart care, we have created designated Provider Care Teams.  These Care Teams include your primary Cardiologist (physician) and Advanced Practice Providers (APPs -  Physician Assistants and Nurse Practitioners) who all work together to provide you with the care you need, when you need it.  We recommend signing up for the patient portal called "MyChart".  Sign up information is provided on this After Visit Summary.  MyChart is used to connect with patients for Virtual Visits (Telemedicine).  Patients are able to view lab/test results, encounter notes, upcoming appointments, etc.  Non-urgent messages can be sent to your provider as well.    To learn more about what you can do with MyChart, go to [email or web address].    Your next appointment:   6 week(s)  The format for your next appointment:   In Person  Provider:   Thurmon Fair, MD {   Other Instructions  You are scheduled for a Cardioversion on 10/16/21 with Dr. Rennis Golden.  Please arrive at the [facility] (Main Entrance A)  at [facility]: [address] [place], [place] [address] at 7 am (1 hour prior to procedure unless lab work is needed)  DIET: Nothing to eat or drink after midnight except a sip of water with medications (see medication instructions below)  FYI: For your safety, and to allow [place] to monitor your vital signs accurately during the surgery/procedure we request that   if you have artificial nails, gel coating, SNS etc. Please have those removed prior to your surgery/procedure. Not having the nail coverings /polish removed may result in cancellation or delay of your surgery/procedure.   Medication Instructions: Hold: Nothing to hold  Continue your anticoagulant: Eliquis You will need to continue your anticoagulant after your procedure until you  are told by your  Provider that it is safe to stop   Labs:  Your provider would like for you to return on 6/30 to have the following labs drawn: BMET and CBC. You do not need an appointment for the lab. Once in our office lobby there is a podium where you can sign in and ring the doorbell to alert [place] that you are here. The lab is open from 8:00 am to 4 pm; closed for lunch from 12:45pm-1:45pm.  You may also go to any of these [facility] locations:   [facility] - [address] ([facility]) - [address] [phone] [address]   You must have a responsible person to drive you home and stay in the waiting area during your procedure. Failure to do so could result in cancellation.  Bring your insurance cards.  *Special Note: Every effort is made  to have your procedure done on time. Occasionally there are emergencies that occur at the hospital that may cause delays. Please be patient if a delay does occur.       Signed, Thurmon Fair, MD  09/18/2021 12:03 PM    [facility]

## 2021-09-17 NOTE — Patient Instructions (Signed)
Medication Instructions:  INCREASE the Metoprolol Succinate to 25 mg once daily  *If you need a refill on your cardiac medications before your next appointment, please call your pharmacy*  Testing/Procedures: Your physician has requested that you have an echocardiogram within the next 2 weeks Echocardiography is a painless test that uses sound waves to create images of your heart. It provides your doctor with information about the size and shape of your heart and how well your heart's chambers and valves are working. You may receive an ultrasound enhancing agent through an IV if needed to better visualize your heart during the echo.This procedure takes approximately one hour. There are no restrictions for this procedure. This will take place at the [address].    Follow-Up: At [facility], you and your health needs are our priority.  As part of our continuing mission to provide you with exceptional heart care, we have created designated Provider Care Teams.  These Care Teams include your primary Cardiologist (physician) and Advanced Practice Providers (APPs -  Physician Assistants and Nurse Practitioners) who all work together to provide you with the care you need, when you need it.  We recommend signing up for the patient portal called "MyChart".  Sign up information is provided on this After Visit Summary.  MyChart is used to connect with patients for Virtual Visits (Telemedicine).  Patients are able to view lab/test results, encounter notes, upcoming appointments, etc.  Non-urgent messages can be sent to your provider as well.   To learn more about what you can do with MyChart, go to [email or web address].    Your next appointment:   6 week(s)  The format for your next appointment:   In Person  Provider:   Sanda Klein, MD {   Other Instructions  You are scheduled for a Cardioversion on 10/16/21 with Dr. Debara Pickett.  Please arrive at the [facility] (Main Entrance A)  at [facility]: [address] [place], [place] [address] at 7 am (1 hour prior to procedure unless lab work is needed)  DIET: Nothing to eat or drink after midnight except a sip of water with medications (see medication instructions below)  FYI: For your safety, and to allow [place] to monitor your vital signs accurately during the surgery/procedure we request that   if you have artificial nails, gel coating, SNS etc. Please have those removed prior to your surgery/procedure. Not having the nail coverings /polish removed may result in cancellation or delay of your surgery/procedure.   Medication Instructions: Hold: Nothing to hold  Continue your anticoagulant: Eliquis You will need to continue your anticoagulant after your procedure until you  are told by your  Provider that it is safe to stop   Labs:  Your provider would like for you to return on 6/30 to have the following labs drawn: BMET and CBC. You do not need an appointment for the lab. Once in our office lobby there is a podium where you can sign in and ring the doorbell to alert [place] that you are here. The lab is open from 8:00 am to 4 pm; closed for lunch from 12:45pm-1:45pm.  You may also go to any of these [facility] locations:   [facility] ([place]) - [address] [place] must have a responsible person to drive you home and stay in the waiting area during your procedure. Failure to  do so could result in cancellation.  Bring your insurance cards.  *Special Note: Every effort is made to have your procedure done on time. Occasionally there are emergencies that occur at the hospital that may cause delays. Please be patient if a delay does occur.

## 2021-09-18 ENCOUNTER — Encounter: Payer: Self-pay | Admitting: Cardiovascular Disease

## 2021-09-24 ENCOUNTER — Telehealth: Payer: Self-pay | Admitting: Cardiovascular Disease

## 2021-09-24 ENCOUNTER — Other Ambulatory Visit: Payer: Self-pay

## 2021-09-24 NOTE — Telephone Encounter (Signed)
Pt c/o medication issue:  1. Name of Medication:  metoprolol succinate (TOPROL-XL) 25 MG 24 hr tablet ELIQUIS 5 MG TABS tablet  2. How are you currently taking this medication (dosage and times per day)? 1 tablet twice a day for both  3. Are you having a reaction (difficulty breathing--STAT)? no  4. What is your medication issue? Patient's Suanne Marker daughter states the patient was taking metoprolol 12.5 mg daily. She says it was then doubled to 25 mg. She says she thinks the patient was confused, because he was taking half a tablet daily for the 12.5 mg. She says he is taking one 25 mg tablet twice a day. She states he is very fatigued since this medication change. She also says he has been coughing up bloody phlegm and is not sure if this is due to his eliquis. She also says he has chills the first of this week, but no fever. Phone: [phone]

## 2021-09-24 NOTE — Telephone Encounter (Signed)
Patient's daughter calling back to speak with Judson Roch about getting the metoprolol called in to his pharmacy.

## 2021-09-24 NOTE — Telephone Encounter (Signed)
Daughter states patient overmedicated on metoprolol succinate at '25mg'$  twice a day. He is supposed to take '25mg'$  daily. Verified with pharmacist K. Alvstad that patient can wait to take his next dose of metoprolol succinate '25mg'$  tomorrow night and to stay on that routine of 25 mg each evening. Patient has a COVID cough since last year and lately coughed blood-tinged sputum. Patient has chills, but no fever. Pharmacist recommended OTC Delsym cough syrup. Patient is to call PCP regarding cough and chills or go to an urgent care. Patient has no blood in urine or stool, and has not vomited any blood. Recommended to daughter that if patient coughs up more blood-tinged sputum, the sputum is bloody, or has patient has bleeding anywhere, to go to the ED. She voiced understanding.

## 2021-09-24 NOTE — Telephone Encounter (Signed)
Daughter called to report that patient's pharmacy never received prescription for metoprolol succinate 25 mg daily. I spoke with pharmacist who stated they have a new computer system. I gave verbal prescription to pharmacist. Daughter advised.

## 2021-09-25 DIAGNOSIS — J209 Acute bronchitis, unspecified: Secondary | ICD-10-CM | POA: Diagnosis not present

## 2021-09-28 ENCOUNTER — Telehealth: Payer: Self-pay | Admitting: Cardiovascular Disease

## 2021-09-28 DIAGNOSIS — J209 Acute bronchitis, unspecified: Secondary | ICD-10-CM | POA: Diagnosis not present

## 2021-09-28 DIAGNOSIS — R051 Acute cough: Secondary | ICD-10-CM | POA: Diagnosis not present

## 2021-09-28 NOTE — Telephone Encounter (Signed)
Daughter stated patient was diagnosed this past Friday with bronchitis. He is going to  urgent care today to be checked for pneumonia. Daughter may have to cancel echo for tomorrow 6/20. She stated as of now, patient will not be able to lie flat for an hour for the echo due to coughing. She will inform us if she has to reschedule echo.

## 2021-09-28 NOTE — Telephone Encounter (Signed)
New Message:    Daughter says patient have Bronchitis, She wants to know if he needs to cancel his Echo for tomorrow?

## 2021-09-29 ENCOUNTER — Other Ambulatory Visit (HOSPITAL_BASED_OUTPATIENT_CLINIC_OR_DEPARTMENT_OTHER): Payer: PPO

## 2021-10-07 ENCOUNTER — Encounter (HOSPITAL_COMMUNITY): Payer: Self-pay | Admitting: Internal Medicine

## 2021-10-09 ENCOUNTER — Ambulatory Visit (INDEPENDENT_AMBULATORY_CARE_PROVIDER_SITE_OTHER): Payer: PPO

## 2021-10-09 DIAGNOSIS — I4819 Other persistent atrial fibrillation: Secondary | ICD-10-CM | POA: Diagnosis not present

## 2021-10-09 LAB — BASIC METABOLIC PANEL

## 2021-10-09 LAB — CBC
MCHC: 33.5 g/dL (ref 31.5–35.7)
Platelets: 263 10*3/uL (ref 150–450)

## 2021-10-10 LAB — CBC
Hematocrit: 39.1 % (ref 37.5–51.0)
Hemoglobin: 13.1 g/dL (ref 13.0–17.7)
MCH: 29.8 pg (ref 26.6–33.0)
MCV: 89 fL (ref 79–97)
RBC: 4.4 x10E6/uL (ref 4.14–5.80)
RDW: 14.7 % (ref 11.6–15.4)
WBC: 8 10*3/uL (ref 3.4–10.8)

## 2021-10-10 LAB — ECHOCARDIOGRAM COMPLETE
AR max vel: 3.58 cm2
AV Area VTI: 3.69 cm2
AV Area mean vel: 3.61 cm2
AV Mean grad: 3 mmHg
AV Peak grad: 5 mmHg
AV Vena cont: 0.19 cm
Ao pk vel: 1.12 m/s
Area-P 1/2: 3.27 cm2
MV VTI: 2.98 cm2
S' Lateral: 2.4 cm

## 2021-10-10 LAB — BASIC METABOLIC PANEL
BUN/Creatinine Ratio: 19 (ref 10–24)
BUN: 15 mg/dL (ref 8–27)
CO2: 25 mmol/L (ref 20–29)
Calcium: 9.3 mg/dL (ref 8.6–10.2)
Chloride: 98 mmol/L (ref 96–106)
Creatinine, Ser: 0.79 mg/dL (ref 0.76–1.27)
Glucose: 74 mg/dL (ref 70–99)
Potassium: 5.1 mmol/L (ref 3.5–5.2)
Sodium: 134 mmol/L (ref 134–144)

## 2021-10-15 ENCOUNTER — Other Ambulatory Visit: Payer: Self-pay | Admitting: *Deleted

## 2021-10-15 DIAGNOSIS — I4819 Other persistent atrial fibrillation: Secondary | ICD-10-CM

## 2021-10-15 NOTE — Anesthesia Preprocedure Evaluation (Addendum)
Anesthesia Evaluation  Patient identified by MRN, date of birth, ID band Patient awake    Reviewed: Allergy & Precautions, NPO status , Patient's Chart, lab work & pertinent test results  Airway Mallampati: II  TM Distance: >3 FB Neck ROM: Full    Dental no notable dental hx. (+) Dental Advisory Given, Upper Dentures   Pulmonary former smoker,    Pulmonary exam normal breath sounds clear to auscultation       Cardiovascular hypertension, Normal cardiovascular exam+ dysrhythmias Atrial Fibrillation  Rhythm:Irregular Rate:Normal  10/09/2021 Echo 1. Left ventricular ejection fraction, by estimation, is 65 to 70%. The  left ventricle has normal function. The left ventricle has no regional  wall motion abnormalities. There is mild left ventricular hypertrophy.  Left ventricular diastolic parameters  are indeterminate. Elevated left ventricular end-diastolic pressure. The  E/e' is 19.  2. Right ventricular systolic function is mildly reduced. The right  ventricular size is mildly enlarged. There is moderately elevated  pulmonary artery systolic pressure. The estimated right ventricular  systolic pressure is 75.6 mmHg.  3. Left atrial size was severely dilated.  4. Right atrial size was moderately dilated.  5. The mitral valve is grossly normal. Trivial mitral valve  regurgitation. No evidence of mitral stenosis.  6. Tricuspid valve regurgitation is mild to moderate.  7. The aortic valve is grossly normal. There is mild calcification of the  aortic valve. Aortic valve regurgitation is trivial. No aortic stenosis is  present.  8. The inferior vena cava is normal in size with <50% respiratory  variability, suggesting right atrial pressure of 8 mmHg.    Neuro/Psych    GI/Hepatic Neg liver ROS,   Endo/Other    Renal/GU      Musculoskeletal   Abdominal   Peds  Hematology negative hematology ROS (+)   Anesthesia  Other Findings   Reproductive/Obstetrics                           Anesthesia Physical Anesthesia Plan  ASA: 3  Anesthesia Plan: General   Post-op Pain Management:    Induction: Intravenous  PONV Risk Score and Plan: Treatment may vary due to age or medical condition  Airway Management Planned: Natural Airway and Nasal Cannula  Additional Equipment:   Intra-op Plan:   Post-operative Plan:   Informed Consent: I have reviewed the patients History and Physical, chart, labs and discussed the procedure including the risks, benefits and alternatives for the proposed anesthesia with the patient or authorized representative who has indicated his/her understanding and acceptance.     Dental advisory given  Plan Discussed with:   Anesthesia Plan Comments:        Anesthesia Quick Evaluation

## 2021-10-16 ENCOUNTER — Other Ambulatory Visit: Payer: Self-pay

## 2021-10-16 ENCOUNTER — Ambulatory Visit (HOSPITAL_COMMUNITY): Payer: PPO | Admitting: Anesthesiology

## 2021-10-16 ENCOUNTER — Ambulatory Visit (HOSPITAL_BASED_OUTPATIENT_CLINIC_OR_DEPARTMENT_OTHER): Payer: PPO | Admitting: Anesthesiology

## 2021-10-16 ENCOUNTER — Encounter (HOSPITAL_COMMUNITY)
Admission: RE | Disposition: A | Discharge status: Home / Self Care | Payer: Self-pay | Source: Home / Self Care | Attending: Internal Medicine

## 2021-10-16 ENCOUNTER — Ambulatory Visit (HOSPITAL_COMMUNITY)
Admission: RE | Admit: 2021-10-16 | Discharge: 2021-10-16 | Disposition: A | Discharge status: Home / Self Care | Payer: PPO | Attending: Internal Medicine | Admitting: Internal Medicine

## 2021-10-16 DIAGNOSIS — I4510 Unspecified right bundle-branch block: Secondary | ICD-10-CM | POA: Diagnosis not present

## 2021-10-16 DIAGNOSIS — Z79890 Hormone replacement therapy (postmenopausal): Secondary | ICD-10-CM | POA: Insufficient documentation

## 2021-10-16 DIAGNOSIS — I10 Essential (primary) hypertension: Secondary | ICD-10-CM | POA: Insufficient documentation

## 2021-10-16 DIAGNOSIS — Z79899 Other long term (current) drug therapy: Secondary | ICD-10-CM | POA: Insufficient documentation

## 2021-10-16 DIAGNOSIS — R0609 Other forms of dyspnea: Secondary | ICD-10-CM | POA: Insufficient documentation

## 2021-10-16 DIAGNOSIS — I4891 Unspecified atrial fibrillation: Secondary | ICD-10-CM

## 2021-10-16 DIAGNOSIS — Z87891 Personal history of nicotine dependence: Secondary | ICD-10-CM

## 2021-10-16 DIAGNOSIS — M179 Osteoarthritis of knee, unspecified: Secondary | ICD-10-CM | POA: Insufficient documentation

## 2021-10-16 DIAGNOSIS — Z8616 Personal history of COVID-19: Secondary | ICD-10-CM | POA: Insufficient documentation

## 2021-10-16 DIAGNOSIS — Z7901 Long term (current) use of anticoagulants: Secondary | ICD-10-CM | POA: Diagnosis not present

## 2021-10-16 DIAGNOSIS — I4819 Other persistent atrial fibrillation: Secondary | ICD-10-CM | POA: Diagnosis not present

## 2021-10-16 HISTORY — PX: CARDIOVERSION: SHX1299

## 2021-10-16 SURGERY — CARDIOVERSION
Anesthesia: General

## 2021-10-16 MED ORDER — SODIUM CHLORIDE 0.9 % IV SOLN: INTRAVENOUS | Status: DC

## 2021-10-16 MED ORDER — PROPOFOL 10 MG/ML IV BOLUS
INTRAVENOUS | Status: DC | PRN
  Administered 2021-10-16: 50 mg via INTRAVENOUS

## 2021-10-16 MED ORDER — LIDOCAINE 2% (20 MG/ML) 5 ML SYRINGE
INTRAMUSCULAR | Status: DC | PRN
  Administered 2021-10-16: 60 mg via INTRAVENOUS

## 2021-10-16 MED ORDER — SODIUM CHLORIDE 0.9 % IV SOLN
INTRAVENOUS | Status: AC | PRN
  Administered 2021-10-16: 500 mL via INTRAVENOUS

## 2021-10-16 NOTE — Transfer of Care (Signed)
Immediate Anesthesia Transfer of Care Note  Patient: Jonathan Woodward  Procedure(s) Performed: CARDIOVERSION  Patient Location: Endoscopy Unit  Anesthesia Type:General  Level of Consciousness: awake and drowsy  Airway & Oxygen Therapy: Patient Spontanous Breathing  Post-op Assessment: Report given to RN and Post -op Vital signs reviewed and stable  Post vital signs: Reviewed and stable  Last Vitals:  Vitals Value Taken Time  BP 121/79 (93)   Temp    Pulse 90   Resp 16   SpO2 98     Last Pain:  Vitals:   10/16/21 0703  TempSrc: Temporal  PainSc: 0-No pain         Complications: No notable events documented.

## 2021-10-16 NOTE — Discharge Instructions (Signed)

## 2021-10-16 NOTE — Anesthesia Postprocedure Evaluation (Signed)
Anesthesia Post Note  Patient: Jonathan Woodward  Procedure(s) Performed: CARDIOVERSION     Patient location during evaluation: Endoscopy Anesthesia Type: General Level of consciousness: awake and alert Pain management: pain level controlled Vital Signs Assessment: post-procedure vital signs reviewed and stable Respiratory status: spontaneous breathing, nonlabored ventilation, respiratory function stable and patient connected to nasal cannula oxygen Cardiovascular status: blood pressure returned to baseline and stable Postop Assessment: no apparent nausea or vomiting Anesthetic complications: no   No notable events documented.  Last Vitals:  Vitals:   10/16/21 0810 10/16/21 0820  BP: 123/65 138/72  Pulse: 79 68  Resp: 19 (!) 21  Temp:    SpO2: 95% 96%    Last Pain:  Vitals:   10/16/21 0820  TempSrc:   PainSc: 0-No pain                 Barnet Glasgow

## 2021-10-16 NOTE — Anesthesia Procedure Notes (Signed)
Procedure Name: General with mask airway Date/Time: 10/16/2021 7:43 AM  Performed by: Dorann Lodge, CRNAPre-anesthesia Checklist: Patient identified, Emergency Drugs available, Suction available and Patient being monitored Patient Re-evaluated:Patient Re-evaluated prior to induction Oxygen Delivery Method: Ambu bag Preoxygenation: Pre-oxygenation with 100% oxygen Induction Type: IV induction

## 2021-10-16 NOTE — CV Procedure (Signed)
   CARDIOVERSION NOTE  Procedure: Electrical Cardioversion Indications:  Atrial Fibrillation  Procedure Details:  Consent: Risks of procedure as well as the alternatives and risks of each were explained to the (patient/caregiver).  Consent for procedure obtained.  Time Out: Verified patient identification, verified procedure, site/side was marked, verified correct patient position, special equipment/implants available, medications/allergies/relevent history reviewed, required imaging and test results available.  Performed  Patient placed on cardiac monitor, pulse oximetry, supplemental oxygen as necessary.  Sedation given:  per anesthesia Pacer pads placed anterior and posterior chest.  Cardioverted 1 time(s).  Cardioverted at 200J biphasic.  Impression: Findings: Post procedure EKG shows:  NSR with PAC's Complications: None Patient did tolerate procedure well.  Plan: Successful DCCV with a single 200J biphasic shock.  Time Spent Directly with the Patient:  30 minutes   [name], MD, [facility], [place] Director of the [facility] Lipid Disorders &  Cardiovascular Risk Reduction Clinic Diplomate of the American Board of Clinical Lipidology Attending Cardiologist  Direct Dial: [phone]  Fax: [phone]  Website:  [email or web address]  Jonathan Woodward 10/16/2021, 7:48 AM

## 2021-10-16 NOTE — Interval H&P Note (Signed)
History and Physical Interval Note:  10/16/2021 7:38 AM  Jonathan Woodward  has presented today for surgery, with the diagnosis of AFIB.  The various methods of treatment have been discussed with the patient and family. After consideration of risks, benefits and other options for treatment, the patient has consented to  Procedure(s): CARDIOVERSION (N/A) as a surgical intervention.  The patient's history has been reviewed, patient examined, no change in status, stable for surgery.  I have reviewed the patient's chart and labs.  Questions were answered to the patient's satisfaction.     [name]

## 2021-10-18 ENCOUNTER — Encounter (HOSPITAL_COMMUNITY): Payer: Self-pay | Admitting: Internal Medicine

## 2021-10-21 ENCOUNTER — Telehealth: Payer: Self-pay | Admitting: Cardiovascular Disease

## 2021-10-21 ENCOUNTER — Encounter: Payer: Self-pay | Admitting: Cardiovascular Disease

## 2021-10-21 MED ORDER — AMLODIPINE BESYLATE 2.5 MG PO TABS: 2.5000 mg | ORAL_TABLET | Freq: Every day | ORAL | 3 refills | Status: DC

## 2021-10-21 MED ORDER — METOPROLOL SUCCINATE ER 25 MG PO TB24: 12.5000 mg | ORAL_TABLET | Freq: Every day | ORAL | 1 refills | Status: DC

## 2021-10-21 NOTE — Telephone Encounter (Signed)
Pt c/o medication issue:  1. Name of Medication: metoprolol succinate (TOPROL-XL) 25 MG 24 hr tablet  2. How are you currently taking this medication (dosage and times per day)? As directed   3. Are you having a reaction (difficulty breathing--STAT)? no  4. What is your medication issue? Patient wanted to know if he could reduce the medication or come off it completely. He just said the medication makes him feel rotten and has no energy. He is very active for his age and is upset because he doesn't have the energy to do anything.   Suanne Marker is the patient's other Daughter. The patient's other Daughter Almyra Free normally  calls but she is out of town

## 2021-10-21 NOTE — Telephone Encounter (Signed)
Patient's daughter notified. Rx sent to pharmacy

## 2021-10-21 NOTE — Telephone Encounter (Signed)
Please go ahead and cut back to half tablet (that is 12.5 mg) daily.  Only needs the higher dose if he goes back into AFib.

## 2021-10-21 NOTE — Telephone Encounter (Signed)
Returned call to patient's daughter. Patient had been on half-tab of metoprolol succinate by PCP previously and Dr. Loletha Grayer increased dose on 6/8. He had cardioversion on 7/7 - f/u on 7/25. Patient states metoprolol succinate just drains him, makes him feel like no energy. He is normally very active and likes to be - cuts down tress, mows 5 acres.   Asked for daughter to call back or send MyChart message with recent BP and pulse readings - last visit HR was >100   Will send to MD/RN

## 2021-10-21 NOTE — Telephone Encounter (Signed)
Spoke w/daughter. Advised MD said OK to decrease dose of meto succ from '25mg'$  QD to 12.'5mg'$  QD. Med list updated. Advised to call if he feels like he is back in AFib or noticed increased HR. He has f/u later this month

## 2021-10-21 NOTE — Telephone Encounter (Signed)
That BP is still too high and will not improve when we reduce the metoprolol. That is the maximum losartan dose. Would like to add amlodipine 2.5 mg daily and recheck BP in 2 weeks please

## 2021-11-03 ENCOUNTER — Encounter: Payer: Self-pay | Admitting: Cardiovascular Disease

## 2021-11-03 ENCOUNTER — Ambulatory Visit: Payer: PPO | Admitting: Cardiovascular Disease

## 2021-11-03 ENCOUNTER — Telehealth (HOSPITAL_COMMUNITY): Payer: Self-pay | Admitting: *Deleted

## 2021-11-03 VITALS — BP 140/80 | HR 68 | Ht 66.0 in | Wt 171.0 lb

## 2021-11-03 DIAGNOSIS — D6869 Other thrombophilia: Secondary | ICD-10-CM

## 2021-11-03 DIAGNOSIS — I10 Essential (primary) hypertension: Secondary | ICD-10-CM

## 2021-11-03 DIAGNOSIS — I519 Heart disease, unspecified: Secondary | ICD-10-CM | POA: Diagnosis not present

## 2021-11-03 DIAGNOSIS — I2721 Secondary pulmonary arterial hypertension: Secondary | ICD-10-CM

## 2021-11-03 DIAGNOSIS — R0602 Shortness of breath: Secondary | ICD-10-CM | POA: Diagnosis not present

## 2021-11-03 DIAGNOSIS — I4819 Other persistent atrial fibrillation: Secondary | ICD-10-CM

## 2021-11-03 NOTE — Patient Instructions (Signed)
Medication Instructions:  No changes *If you need a refill on your cardiac medications before your next appointment, please call your pharmacy*   Lab Work: None ordered If you have labs (blood work) drawn today and your tests are completely normal, you will receive your results only by: [place] (if you have MyChart) OR A paper copy in the mail If you have any lab test that is abnormal or we need to change your treatment, we will call you to review the results.   Testing/Procedures: Your physician has requested that you have a lexiscan myoview. For further information please visit [email or web address]. Please follow instruction sheet, as given. This will take place at [address], [address]  How to prepare for your Myocardial Perfusion Test: Do not eat or drink 3 hours prior to your test, except you may have water. Do not consume products containing caffeine (regular or decaffeinated) 12 hours prior to your test. (ex: coffee, chocolate, sodas, tea). Do bring a list of your current medications with you.  If not listed below, you may take your medications as normal. Do wear comfortable clothes (no dresses or overalls) and walking shoes, tennis shoes preferred (No heels or open toe shoes are allowed). Do NOT wear cologne, perfume, aftershave, or lotions (deodorant is allowed). The test will take approximately 3 to 4 hours to complete If these instructions are not followed, your test will have to be rescheduled.    Follow-Up: At [facility], you and your health needs are our priority.  As part of our continuing mission to provide you with exceptional heart care, we have created designated Provider Care Teams.  These Care Teams include your primary Cardiologist (physician) and Advanced Practice Providers (APPs -  Physician Assistants and Nurse Practitioners) who all work together to provide you with the care you need, when you need it.  We recommend signing up for the patient  portal called "MyChart".  Sign up information is provided on this After Visit Summary.  MyChart is used to connect with patients for Virtual Visits (Telemedicine).  Patients are able to view lab/test results, encounter notes, upcoming appointments, etc.  Non-urgent messages can be sent to your provider as well.   To learn more about what you can do with MyChart, go to [email or web address].    Your next appointment:   6-8 week(s)   The format for your next appointment:   In Person  Provider:   Sanda Klein, MD {    Important Information About Sugar

## 2021-11-03 NOTE — Telephone Encounter (Signed)
Patient given detailed instructions per Myocardial Perfusion Study Information Sheet for the test on 11/04/21 at 1000. Patient notified to arrive 15 minutes early and that it is imperative to arrive on time for appointment to keep from having the test rescheduled.  If you need to cancel or reschedule your appointment, please call the office within 24 hours of your appointment. . Patient verbalized understanding.Tayveon Lombardo, [place]

## 2021-11-03 NOTE — Progress Notes (Signed)
Cardiology Office Note:    Date:  11/04/2021   ID:  [name], DOB November 17, 1932, MRN [id number]  PCP:  Lonie Peak, PA-C   [facility] Providers Cardiologist:  Thurmon Fair, MD     Referring MD: Lonie Peak, PA-C   No chief complaint on file. Jonathan Woodward is a 88 y.o. male who is being seen today for the evaluation of newly diagnosed atrial fibrillation at the request of Lonie Peak, [place].   History of Present Illness:    Jonathan Woodward is a 88 y.o. male with a hx of generally excellent health, mild hypertension, knee osteoarthritis, history of carpal tunnel syndrome and surgery for cataracts who was incidentally found to have atrial fibrillation during a recent appointment with his primary care provider.    He returns after undergoing elective cardioversion.  Was successfully performed on July 7.  He has kept a detailed daily log of blood pressure and heart rate since then.  Based on this it is apparent that he maintained normal rhythm until the morning of July 23.  He did not initially notice improvement in symptoms after the cardioversion, but after we reduce his dose of beta-blocker he noticed that he had markedly improved stamina and energy.  He continues to perform pretty intense physical activity for a gentleman his age.  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  His daughter wonders whether that activity may have precipitated the arrhythmia recurrence.  When atrial fibrillation recurred his initial ventricular rate was around 120.  He subsequently increase the dose of metoprolol back to a whole tablet daily and his ventricular rate has been in the 80-100 range since then.  He remains unaware of palpitations, however once he went back into atrial fibrillation he noticed a decline in his energy level.  He denies dyspnea or angina and has not had dizziness or syncope.  He has not had any falls, injuries or  bleeding problems on Eliquis.  Has an upcoming appointment with an orthopedic specialist to discuss management of left shoulder pain.  He is not sure whether he will need an injection or surgery or some other type of procedure.  Therefore would like to delay cardioversion (which would preclude interruption of anticoagulation for a month afterwards).  He is unaware of the arrhythmia.  He is remarkably physically active for his age.  He does a lot of yard work, cutting down trees and clearing limbs.  He lives independently with his 86 year old significant other on a large property.  He had COVID-19 infection about a year ago and has noticed that he has mild exertional dyspnea (NYHA functional class II) since then.   Past Medical History:  Diagnosis Date   Atrial fibrillation (HCC)    HTN (hypertension)     Past Surgical History:  Procedure Laterality Date   CARDIOVERSION N/A 10/16/2021   Procedure: CARDIOVERSION;  Surgeon: Chrystie Nose, MD;  Location: [facility];  Service: Cardiovascular;  Laterality: N/A;   CARPAL TUNNEL RELEASE     CATARACT EXTRACTION      Current Medications: Current Meds  Medication Sig   amLODipine (NORVASC) 2.5 MG tablet Take 1 tablet (2.5 mg total) by mouth daily.   apixaban (ELIQUIS) 5 MG TABS tablet Take 5 mg by mouth 2 (two) times daily.   diphenhydrAMINE-APAP, sleep, (TYLENOL PM EXTRA STRENGTH PO) Take 1 tablet by mouth at bedtime as needed (pain/sleep).   Flaxseed, Linseed, (FLAXSEED OIL) 1200 MG  CAPS Take 1,200 mg by mouth in the morning.   losartan (COZAAR) 50 MG tablet Take 50 mg by mouth 2 (two) times daily.   metoprolol succinate (TOPROL-XL) 25 MG 24 hr tablet Take 0.5 tablets (12.5 mg total) by mouth daily.   NON FORMULARY Take 1 tablet by mouth in the morning and at bedtime. Advance Joint Health Complex( [place])   Omega-3 Fatty Acids (FISH OIL) 1000 MG CAPS Take 1,000 mg by mouth in the morning.   sennosides-docusate sodium (SENOKOT-S) 8.6-50 MG  tablet Take 1 tablet by mouth daily as needed for constipation.     Allergies:   Patient has no known allergies.   Social History   Socioeconomic History   Marital status: Single    Spouse name: Not on file   Number of children: Not on file   Years of education: Not on file   Highest education level: Not on file  Occupational History   Not on file  Tobacco Use   Smoking status: Former    Types: Cigarettes, Pipe, Cigars    Quit date: 1980    Years since quitting: 43.5   Smokeless tobacco: Former    Types: Chew, Snuff    Quit date: 1980  Substance and Sexual Activity   Alcohol use: Not on file   Drug use: Not on file   Sexual activity: Not on file  Other Topics Concern   Not on file  Social History Narrative   Not on file   Social Determinants of Health   Financial Resource Strain: Not on file  Food Insecurity: Not on file  Transportation Needs: Not on file  Physical Activity: Not on file  Stress: Not on file  Social Connections: Not on file     Family History: The patient's family history is significantly negative for cardiac illness or stroke  ROS:   Please see the history of present illness.     All other systems reviewed and are negative.  EKGs/Labs/Other Studies Reviewed:    The following studies were reviewed today:   Echocardiogram 10/09/2021    1. Left ventricular ejection fraction, by estimation, is 65 to 70%. The  left ventricle has normal function. The left ventricle has no regional  wall motion abnormalities. There is mild left ventricular hypertrophy.  Left ventricular diastolic parameters  are indeterminate. Elevated left ventricular end-diastolic pressure. The  E/e' is 19.   2. Right ventricular systolic function is mildly reduced. The right  ventricular size is mildly enlarged. There is moderately elevated  pulmonary artery systolic pressure. The estimated right ventricular  systolic pressure is 46.7 mmHg.   3. Left atrial size was  severely dilated.   4. Right atrial size was moderately dilated.   5. The mitral valve is grossly normal. Trivial mitral valve  regurgitation. No evidence of mitral stenosis.   6. Tricuspid valve regurgitation is mild to moderate.   7. The aortic valve is grossly normal. There is mild calcification of the  aortic valve. Aortic valve regurgitation is trivial. No aortic stenosis is  present.   8. The inferior vena cava is normal in size with <50% respiratory  variability, suggesting right atrial pressure of 8 mmHg.   EKG:  EKG is ordered today.  The ekg ordered today demonstrates atrial fibrillation with mild RVR, RBBB, no repolarization abnormalities, QTc 463 ms.  Recent Labs: 10/09/2021: BUN 15; Creatinine, Ser 0.79; Hemoglobin 13.1; Platelets 263; Potassium 5.1; Sodium 134  Recent Lipid Panel No results found for: "CHOL", "TRIG", "  HDL", "CHOLHDL", "VLDL", "LDLCALC", "LDLDIRECT"   Risk Assessment/Calculations:    CHA2DS2-VASc Score = 3   This indicates a 3.2% annual risk of stroke. The patient's score is based upon: CHF History: 0 HTN History: 1 Diabetes History: 0 Stroke History: 0 Vascular Disease History: 0 Age Score: 2 Gender Score: 0          Physical Exam:    VS:  BP 140/80 (BP Location: Left Arm, Patient Position: Sitting, Cuff Size: Normal)   Pulse 68   Ht 5\' 6"  (1.676 m)   Wt 171 lb (77.6 kg)   SpO2 99%   BMI 27.60 kg/m     Wt Readings from Last 3 Encounters:  11/04/21 171 lb (77.6 kg)  11/03/21 171 lb (77.6 kg)  10/16/21 180 lb (81.6 kg)     General: Alert, oriented x3, no distress, appears quite fit and substantially younger than stated age Head: no evidence of trauma, PERRL, EOMI, no exophtalmos or lid lag, no myxedema, no xanthelasma; normal ears, nose and oropharynx Neck: normal jugular venous pulsations and no hepatojugular reflux; brisk carotid pulses without delay and no carotid bruits Chest: clear to auscultation, no signs of consolidation by  percussion or palpation, normal fremitus, symmetrical and full respiratory excursions Cardiovascular: normal position and quality of the apical impulse, irregular rhythm, normal first and second heart sounds, no murmurs, rubs or gallops Abdomen: no tenderness or distention, no masses by palpation, no abnormal pulsatility or arterial bruits, normal bowel sounds, no hepatosplenomegaly Extremities: no clubbing, cyanosis or edema; 2+ radial, ulnar and brachial pulses bilaterally; 2+ right femoral, posterior tibial and dorsalis pedis pulses; 2+ left femoral, posterior tibial and dorsalis pedis pulses; no subclavian or femoral bruits Neurological: grossly nonfocal Psych: Normal mood and affect   ASSESSMENT:    1. Persistent atrial fibrillation (HCC)   2. Essential hypertension   3. Right ventricular dysfunction   4. Pulmonary artery hypertension (HCC)   5. Acquired thrombophilia (HCC)   6. Shortness of breath     PLAN:    In order of problems listed above:  AFib: It does appear that he had improved functional status when he was back in sinus rhythm, but the arrhythmia recurred after about 2 weeks.  Exact duration of the atrial fibrillation is uncertain, but apparently less than 6 months.  He does not have palpitations.  He does have severe left atrial dilation as well as moderate right atrial dilation.  We discussed further management with either rate control (which is reasonable since his symptoms are mild and he is 87 years old) versus starting an antiarrhythmic and performing another cardioversion.  We discussed antiarrhythmic options such as flecainide (for which we will first have to screen for CAD), dofetilide (which will require hospitalization or amiodarone (which would be my last choice due to its side effect profile).  We discussed the fact that performing a cardioversion would again delay any surgical interventions by a month. HTN: Blood pressure is well controlled on the current  medications. RV dysfunction: Echocardiogram does show evidence of mildly reduced right ventricular systolic function and mild right ventricular dilation and the PA pressure is elevated at roughly 47 mmHg.  He does not have symptoms of sleep apnea.  He smoked for a while, but quit 40 years ago and does not have typical findings of COPD.  Developed mild exertional dyspnea following COVID-19 infection last year.  Has RBBB on ECG.  He does not have overt clinical findings of right heart failure/hypervolemia.  He  will be on chronic anticoagulation.  I am not entirely sure of the benefit of aggressive work-up for his pulmonary hypertension at this point. Anticoagulation: Well-tolerated, no bleeding problems so far.  If he plans to have shoulder surgery, he can interrupt the Eliquis for 48 hours before the procedure. History of carpal tunnel release: No other findings to raise suspicion for amyloidosis.  Normal voltage on ECG.  Does not have symptoms of heart failure. Left shoulder pain: He has pain with active abduction of the shoulder and is unable to lift his arm to horizontal level.  He does not have any pain with passive extension or abduction of the upper extremity.  Suspect he has rotator cuff tear.  We will bring him back in a few weeks, after his orthopedic issues have been addressed.   Medication Adjustments/Labs and Tests Ordered: Current medicines are reviewed at length with the patient today.  Concerns regarding medicines are outlined above.  Orders Placed This Encounter  Procedures   Cardiac Stress Test: Informed Consent Details: Physician/Practitioner Attestation; Transcribe to consent form and obtain patient signature   Myocardial Perfusion Imaging   No orders of the defined types were placed in this encounter.   Patient Instructions  Medication Instructions:  No changes *If you need a refill on your cardiac medications before your next appointment, please call your pharmacy*   Lab  Work: None ordered If you have labs (blood work) drawn today and your tests are completely normal, you will receive your results only by: MyChart Message (if you have MyChart) OR A paper copy in the mail If you have any lab test that is abnormal or we need to change your treatment, we will call you to review the results.   Testing/Procedures: Your physician has requested that you have a lexiscan myoview. For further information please visit [email or web address] Please follow instruction sheet, as given. This will take place at [address]  How to prepare for your Myocardial Perfusion Test: Do not eat or drink 3 hours prior to your test, except you may have water. Do not consume products containing caffeine (regular or decaffeinated) 12 hours prior to your test. (ex: coffee, chocolate, sodas, tea). Do bring a list of your current medications with you.  If not listed below, you may take your medications as normal. Do wear comfortable clothes (no dresses or overalls) and walking shoes, tennis shoes preferred (No heels or open toe shoes are allowed). Do NOT wear cologne, perfume, aftershave, or lotions (deodorant is allowed). The test will take approximately 3 to 4 hours to complete If these instructions are not followed, your test will have to be rescheduled.    Follow-Up: At [facility], you and your health needs are our priority.  As part of our continuing mission to provide you with exceptional heart care, we have created designated Provider Care Teams.  These Care Teams include your primary Cardiologist (physician) and Advanced Practice Providers (APPs -  Physician Assistants and Nurse Practitioners) who all work together to provide you with the care you need, when you need it.  We recommend signing up for the patient portal called "MyChart".  Sign up information is provided on this After Visit Summary.  MyChart is used to connect with patients for Virtual Visits  (Telemedicine).  Patients are able to view lab/test results, encounter notes, upcoming appointments, etc.  Non-urgent messages can be sent to your provider as well.   To learn more about what you can do with  MyChart, go to [email or web address].    Your next appointment:   6-8 week(s)   The format for your next appointment:   In Person  Provider:   Thurmon Fair, MD {    Important Information About Sugar         Signed, Thurmon Fair, MD  11/04/2021 5:42 PM    [facility]

## 2021-11-04 ENCOUNTER — Ambulatory Visit (HOSPITAL_COMMUNITY): Payer: PPO | Attending: Cardiovascular Disease

## 2021-11-04 DIAGNOSIS — R0602 Shortness of breath: Secondary | ICD-10-CM | POA: Diagnosis not present

## 2021-11-04 LAB — MYOCARDIAL PERFUSION IMAGING
LV dias vol: 79 mL (ref 62–150)
LV sys vol: 40 mL
Nuc Stress EF: 49 %
Peak HR: 116 {beats}/min
Rest HR: 99 {beats}/min
Rest Nuclear Isotope Dose: 10.1 mCi
SDS: 0
SRS: 0
SSS: 0
ST Depression (mm): 0 mm
Stress Nuclear Isotope Dose: 31.8 mCi
TID: 1.03

## 2021-11-04 MED ORDER — TECHNETIUM TC 99M TETROFOSMIN IV KIT
10.1000 | PACK | Freq: Once | INTRAVENOUS | Status: AC | PRN
  Administered 2021-11-04: 10.1 via INTRAVENOUS

## 2021-11-04 MED ORDER — TECHNETIUM TC 99M TETROFOSMIN IV KIT
31.8000 | PACK | Freq: Once | INTRAVENOUS | Status: AC | PRN
  Administered 2021-11-04: 31.8 via INTRAVENOUS

## 2021-11-04 MED ORDER — REGADENOSON 0.4 MG/5ML IV SOLN
0.4000 mg | Freq: Once | INTRAVENOUS | Status: AC
  Administered 2021-11-04: 0.4 mg via INTRAVENOUS

## 2021-11-06 ENCOUNTER — Encounter: Payer: Self-pay | Admitting: *Deleted

## 2021-11-16 DIAGNOSIS — M25812 Other specified joint disorders, left shoulder: Secondary | ICD-10-CM | POA: Diagnosis not present

## 2021-11-21 ENCOUNTER — Encounter: Payer: Self-pay | Admitting: Cardiovascular Disease

## 2021-11-23 NOTE — Telephone Encounter (Signed)
Thank you.  Could you just please make sure that he is taking his Eliquis again?  He has an appoint with me on December 18, 2008 we can schedule him for cardioversion at that time.

## 2021-12-18 ENCOUNTER — Ambulatory Visit: Payer: PPO | Attending: Cardiovascular Disease | Admitting: Cardiovascular Disease

## 2021-12-18 ENCOUNTER — Telehealth: Payer: Self-pay | Admitting: *Deleted

## 2021-12-18 ENCOUNTER — Encounter: Payer: Self-pay | Admitting: Cardiovascular Disease

## 2021-12-18 VITALS — BP 139/77 | HR 90 | Ht 66.0 in | Wt 174.0 lb

## 2021-12-18 DIAGNOSIS — M1712 Unilateral primary osteoarthritis, left knee: Secondary | ICD-10-CM | POA: Diagnosis not present

## 2021-12-18 DIAGNOSIS — I4819 Other persistent atrial fibrillation: Secondary | ICD-10-CM | POA: Diagnosis not present

## 2021-12-18 DIAGNOSIS — D6869 Other thrombophilia: Secondary | ICD-10-CM | POA: Diagnosis not present

## 2021-12-18 DIAGNOSIS — I519 Heart disease, unspecified: Secondary | ICD-10-CM

## 2021-12-18 DIAGNOSIS — I10 Essential (primary) hypertension: Secondary | ICD-10-CM

## 2021-12-18 MED ORDER — FLECAINIDE ACETATE 50 MG PO TABS: 50.0000 mg | ORAL_TABLET | Freq: Two times a day (BID) | ORAL | 0 refills | Status: DC

## 2021-12-18 MED ORDER — METOPROLOL SUCCINATE ER 25 MG PO TB24: 25.0000 mg | ORAL_TABLET | Freq: Every day | ORAL | 3 refills | Status: DC

## 2021-12-18 NOTE — Progress Notes (Signed)
Cardiology Office Note:    Date:  12/18/2021   ID:  Jonathan Woodward, DOB [birth year], MRN [id number]  PCP:  Lonie Peak, PA-C   [facility] Providers Cardiologist:  Thurmon Fair, MD     Referring MD: Lonie Peak, PA-C   Chief Complaint  Patient presents with   Atrial Fibrillation    History of Present Illness:    Jonathan Woodward is a 88 y.o. male with a hx of generally excellent health, mild hypertension, knee osteoarthritis, history of carpal tunnel syndrome and surgery for cataracts who was incidentally found to have atrial fibrillation during a recent appointment with his primary care provider.  He had successful electrical cardioversion on July 7 with improvement in energy level, but with recurrence of atrial fibrillation on July 23.  During atrial fibrillation he requires metoprolol 25 mg daily for rate control, but when he was in sinus rhythm he felt better on half that dose.  He had a recent shoulder injection with improved mobility and reduce pain in his left shoulder.  He is planning to undergo left total knee replacement in the near future, but this is not yet scheduled.  He remains unaware of palpitations, however once he went back into atrial fibrillation he noticed a decline in his energy level.  He denies dyspnea or angina and has not had dizziness or syncope.  Has not had any falls or injuries or serious bleeding problems on Eliquis.  He is unaware of the arrhythmia.  He is remarkably physically active for his age.  He does a lot of yard work, cutting down trees and clearing limbs.  He lives independently with his 86 year old significant other on a large property.  He had COVID-19 infection about a year ago and has noticed that he has mild exertional dyspnea (NYHA functional class II) since then.   Past Medical History:  Diagnosis Date   Atrial fibrillation (HCC)    HTN (hypertension)     Past Surgical History:  Procedure Laterality Date    CARDIOVERSION N/A 10/16/2021   Procedure: CARDIOVERSION;  Surgeon: Chrystie Nose, MD;  Location: [facility];  Service: Cardiovascular;  Laterality: N/A;   CARPAL TUNNEL RELEASE     CATARACT EXTRACTION      Current Medications: Current Meds  Medication Sig   amLODipine (NORVASC) 2.5 MG tablet Take 1 tablet (2.5 mg total) by mouth daily.   apixaban (ELIQUIS) 5 MG TABS tablet Take 5 mg by mouth 2 (two) times daily.   diphenhydrAMINE-APAP, sleep, (TYLENOL PM EXTRA STRENGTH PO) Take 1 tablet by mouth at bedtime as needed (pain/sleep).   Flaxseed, Linseed, (FLAXSEED OIL) 1200 MG CAPS Take 1,200 mg by mouth in the morning.   flecainide (TAMBOCOR) 50 MG tablet Take 1 tablet (50 mg total) by mouth 2 (two) times daily.   losartan (COZAAR) 50 MG tablet Take 50 mg by mouth 2 (two) times daily.   NON FORMULARY Take 1 tablet by mouth in the morning and at bedtime. Advance Joint Health Complex( [place])   Omega-3 Fatty Acids (FISH OIL) 1000 MG CAPS Take 1,000 mg by mouth in the morning.   sennosides-docusate sodium (SENOKOT-S) 8.6-50 MG tablet Take 1 tablet by mouth daily as needed for constipation.   [DISCONTINUED] metoprolol succinate (TOPROL-XL) 25 MG 24 hr tablet Take 1 tablet by mouth daily.     Allergies:   Patient has no known allergies.   Social History   Socioeconomic History   Marital status: Single    Spouse name:  Not on file   Number of children: Not on file   Years of education: Not on file   Highest education level: Not on file  Occupational History   Not on file  Tobacco Use   Smoking status: Former    Types: Cigarettes, Pipe, Cigars    Quit date: 1980    Years since quitting: 43.7   Smokeless tobacco: Former    Types: Chew, Snuff    Quit date: 1980  Substance and Sexual Activity   Alcohol use: Not on file   Drug use: Not on file   Sexual activity: Not on file  Other Topics Concern   Not on file  Social History Narrative   Not on file   Social Determinants of  Health   Financial Resource Strain: Not on file  Food Insecurity: Not on file  Transportation Needs: Not on file  Physical Activity: Not on file  Stress: Not on file  Social Connections: Not on file     Family History: The patient's family history is significantly negative for cardiac illness or stroke  ROS:   Please see the history of present illness.     All other systems reviewed and are negative.  EKGs/Labs/Other Studies Reviewed:    The following studies were reviewed today:   Echocardiogram 10/09/2021    1. Left ventricular ejection fraction, by estimation, is 65 to 70%. The  left ventricle has normal function. The left ventricle has no regional  wall motion abnormalities. There is mild left ventricular hypertrophy.  Left ventricular diastolic parameters  are indeterminate. Elevated left ventricular end-diastolic pressure. The  E/e' is 19.   2. Right ventricular systolic function is mildly reduced. The right  ventricular size is mildly enlarged. There is moderately elevated  pulmonary artery systolic pressure. The estimated right ventricular  systolic pressure is 46.7 mmHg.   3. Left atrial size was severely dilated.   4. Right atrial size was moderately dilated.   5. The mitral valve is grossly normal. Trivial mitral valve  regurgitation. No evidence of mitral stenosis.   6. Tricuspid valve regurgitation is mild to moderate.   7. The aortic valve is grossly normal. There is mild calcification of the  aortic valve. Aortic valve regurgitation is trivial. No aortic stenosis is  present.   8. The inferior vena cava is normal in size with <50% respiratory  variability, suggesting right atrial pressure of 8 mmHg.   EKG:  EKG is ordered today.  The ekg ordered today demonstrates atrial fibrillation with mild RVR, RBBB, no repolarization abnormalities, QTc 463 ms.  Recent Labs: 10/09/2021: BUN 15; Creatinine, Ser 0.79; Hemoglobin 13.1; Platelets 263; Potassium 5.1;  Sodium 134  Recent Lipid Panel No results found for: "CHOL", "TRIG", "HDL", "CHOLHDL", "VLDL", "LDLCALC", "LDLDIRECT" 08/31/2021 Cholesterol 163, HDL 69, LDL 82, triglycerides 62  Risk Assessment/Calculations:    CHA2DS2-VASc Score = 3   This indicates a 3.2% annual risk of stroke. The patient's score is based upon: CHF History: 0 HTN History: 1 Diabetes History: 0 Stroke History: 0 Vascular Disease History: 0 Age Score: 2 Gender Score: 0               Physical Exam:    VS:  BP 139/77   Pulse 90   Ht 5\' 6"  (1.676 m)   Wt 174 lb (78.9 kg)   SpO2 97%   BMI 28.08 kg/m     Wt Readings from Last 3 Encounters:  12/18/21 174 lb (78.9 kg)  11/04/21  171 lb (77.6 kg)  11/03/21 171 lb (77.6 kg)      General: Alert, oriented x3, no distress Head: no evidence of trauma, PERRL, EOMI, no exophtalmos or lid lag, no myxedema, no xanthelasma; normal ears, nose and oropharynx Neck: normal jugular venous pulsations and no hepatojugular reflux; brisk carotid pulses without delay and no carotid bruits Chest: clear to auscultation, no signs of consolidation by percussion or palpation, normal fremitus, symmetrical and full respiratory excursions Cardiovascular: normal position and quality of the apical impulse, irregular rhythm, normal first and second heart sounds, no murmurs, rubs or gallops Abdomen: no tenderness or distention, no masses by palpation, no abnormal pulsatility or arterial bruits, normal bowel sounds, no hepatosplenomegaly Extremities: no clubbing, cyanosis or edema; 2+ radial, ulnar and brachial pulses bilaterally; 2+ right femoral, posterior tibial and dorsalis pedis pulses; 2+ left femoral, posterior tibial and dorsalis pedis pulses; no subclavian or femoral bruits Neurological: grossly nonfocal Psych: Normal mood and affect   ASSESSMENT:    1. Persistent atrial fibrillation (HCC)   2. Essential hypertension   3. Right ventricular dysfunction   4. Acquired  thrombophilia (HCC)   5. Arthritis of left knee      PLAN:    In order of problems listed above:  AFib: It does appear that he had improved functional status when he was back in sinus rhythm, but the arrhythmia recurred after about 2 weeks.  Exact duration of the atrial fibrillation is uncertain, but apparently less than 6 months.  He does not have palpitations.  He does have severe left atrial dilation as well as moderate right atrial dilation.  No evidence of CAD.  Have reviewed the different options for antiarrhythmics.  We will start flecainide 50 mg twice daily and attempt cardioversion again.  We discussed the fact that performing a cardioversion would again delay any surgical interventions by a month. HTN: Usually well controlled.  Unexpectedly high today. RV dysfunction: Echocardiogram does show evidence of mildly reduced right ventricular systolic function and mild right ventricular dilation and the PA pressure is elevated at roughly 47 mmHg.  He does not have symptoms of sleep apnea.  He smoked for a while, but quit 40 years ago and does not have typical findings of COPD.  Developed mild exertional dyspnea following COVID-19 infection last year.  Has RBBB on ECG.  He does not have overt clinical findings of right heart failure/hypervolemia.  He will be on chronic anticoagulation.  I am not entirely sure of the benefit of aggressive work-up for his pulmonary hypertension at this point. Anticoagulation: Well-tolerated, no bleeding problems so far.  Reviewed the fact that if we go ahead with cardioversion, anticoagulants cannot be stopped for 4 weeks thereafter. History of carpal tunnel release: No other findings to raise suspicion for amyloidosis.  Normal voltage on ECG.  Does not have symptoms of heart failure. Left knee pain: He is at low risk for cardiovascular complications with knee surgery.  The surgery will have to wait for roughly 30 days after cardioversion.  He will have to stop the  Eliquis for 2-3 days before this procedure, but it should be resumed as soon as possible after surgery.  This will also protect him from potential complications of DVT/PE   Shared Decision Making/Informed Consent The risks (stroke, cardiac arrhythmias rarely resulting in the need for a temporary or permanent pacemaker, skin irritation or burns and complications associated with conscious sedation including aspiration, arrhythmia, respiratory failure and death), benefits (restoration of normal sinus rhythm)  and alternatives of a direct current cardioversion were explained in detail to Mr. Hackley and he agrees to proceed.     Medication Adjustments/Labs and Tests Ordered: Current medicines are reviewed at length with the patient today.  Concerns regarding medicines are outlined above.  Orders Placed This Encounter  Procedures   Basic metabolic panel   CBC   EKG 12-Lead   Meds ordered this encounter  Medications   metoprolol succinate (TOPROL-XL) 25 MG 24 hr tablet    Sig: Take 1 tablet (25 mg total) by mouth daily.    Dispense:  90 tablet    Refill:  3   flecainide (TAMBOCOR) 50 MG tablet    Sig: Take 1 tablet (50 mg total) by mouth 2 (two) times daily.    Dispense:  60 tablet    Refill:  0    Patient Instructions  Medication Instructions:  START Flecainide 50 mg twice daily  *If you need a refill on your cardiac medications before your next appointment, please call your pharmacy*  Follow-Up: At [facility], you and your health needs are our priority.  As part of our continuing mission to provide you with exceptional heart care, we have created designated Provider Care Teams.  These Care Teams include your primary Cardiologist (physician) and Advanced Practice Providers (APPs -  Physician Assistants and Nurse Practitioners) who all work together to provide you with the care you need, when you need it.  We recommend signing up for the patient portal called "MyChart".  Sign  up information is provided on this After Visit Summary.  MyChart is used to connect with patients for Virtual Visits (Telemedicine).  Patients are able to view lab/test results, encounter notes, upcoming appointments, etc.  Non-urgent messages can be sent to your provider as well.   To learn more about what you can do with MyChart, go to [email or web address].    Your next appointment:   Keep your post procedure follow up on 10/4 at 2:40 with Dr. Royann Shivers Other Instructions  You are scheduled for a Cardioversion on 9/19 with Dr. Jacques Navy.  Please arrive at the [facility] (Main Entrance A) at [facility]: [address] [place], [place] [address] at 9:30 am. (1 hour prior to procedure)  DIET: Nothing to eat or drink after midnight except a sip of water with medications (see medication instructions below)  FYI: For your safety, and to allow [place] to monitor your vital signs accurately during the surgery/procedure we request that   if you have artificial nails, gel coating, SNS etc. Please have those removed prior to your surgery/procedure. Not having the nail coverings /polish removed may result in cancellation or delay of your surgery/procedure.   Medication Instructions: Hold-nothing to hold  Continue your anticoagulant: Eliquis You will need to continue your anticoagulant after your procedure until you  are told by your  Provider that it is safe to stop   Labs:  Your provider would like for you to return  9/12 to have the following labs drawn: CBC and BMET. You do not need an appointment for the lab. Once in our office lobby there is a podium where you can sign in and ring the doorbell to alert [place] that you are here. The lab is open from 8:00 am to 4 pm; closed for lunch from 12:45pm-1:45pm.  You may also go to any of these [facility] locations:   [facility] - [address] ([facility]) - [address]   You must have a responsible person to drive you home and stay in the waiting area during your procedure. Failure to do so could result in cancellation.  Bring your insurance cards.  *Special Note: Every effort is made to have your procedure done on time. Occasionally there are emergencies that occur at the hospital that may cause delays. Please be patient if a delay does occur.      Signed, Thurmon Fair, MD  12/18/2021 10:07 AM    [facility]

## 2021-12-18 NOTE — Patient Instructions (Signed)
Medication Instructions:  START Flecainide 50 mg twice daily  *If you need a refill on your cardiac medications before your next appointment, please call your pharmacy*  Follow-Up: At [facility], you and your health needs are our priority.  As part of our continuing mission to provide you with exceptional heart care, we have created designated Provider Care Teams.  These Care Teams include your primary Cardiologist (physician) and Advanced Practice Providers (APPs -  Physician Assistants and Nurse Practitioners) who all work together to provide you with the care you need, when you need it.  We recommend signing up for the patient portal called "MyChart".  Sign up information is provided on this After Visit Summary.  MyChart is used to connect with patients for Virtual Visits (Telemedicine).  Patients are able to view lab/test results, encounter notes, upcoming appointments, etc.  Non-urgent messages can be sent to your provider as well.   To learn more about what you can do with MyChart, go to [email or web address].    Your next appointment:   Keep your post procedure follow up on 10/4 at 2:40 with Dr. Sallyanne Kuster Other Instructions  You are scheduled for a Cardioversion on 9/19 with Dr. Margaretann Loveless.  Please arrive at the [facility] (Main Entrance A) at [facility]: [address] [place], [place] [address] at 9:30 am. (1 hour prior to procedure)  DIET: Nothing to eat or drink after midnight except a sip of water with medications (see medication instructions below)  FYI: For your safety, and to allow [place] to monitor your vital signs accurately during the surgery/procedure we request that   if you have artificial nails, gel coating, SNS etc. Please have those removed prior to your surgery/procedure. Not having the nail coverings /polish removed may result in cancellation or delay of your surgery/procedure.   Medication Instructions: Hold-nothing to hold  Continue your  anticoagulant: Eliquis You will need to continue your anticoagulant after your procedure until you  are told by your  Provider that it is safe to stop   Labs:  Your provider would like for you to return  9/12 to have the following labs drawn: CBC and BMET. You do not need an appointment for the lab. Once in our office lobby there is a podium where you can sign in and ring the doorbell to alert [place] that you are here. The lab is open from 8:00 am to 4 pm; closed for lunch from 12:45pm-1:45pm.  You may also go to any of these [facility] locations:   [facility] ([place]) - [place] must have a responsible person to drive you home and stay in the waiting area during your procedure. Failure to do so could result in cancellation.  Bring your insurance cards.  *Special Note: Every effort is made to have your procedure done on time. Occasionally there are emergencies that occur at the hospital that may cause delays. Please be patient if a delay does occur.

## 2021-12-18 NOTE — Telephone Encounter (Signed)
Pre-operative Risk Assessment    Patient Name: Jonathan Woodward  DOB: 1933-01-19 MRN: [id number]      Request for Surgical Clearance    Procedure:   RIGHT TOTAL KNEE REPLACEMENT  Date of Surgery:  Clearance TBD                                 Surgeon:  DR. DANIEL MARCHWIANY Surgeon's Group or Practice Name:  [facility] Phone number:  [phone] [phone] ATTN: KELLY HIGH Fax number:  [phone]   Type of Clearance Requested:   - Medical  - Pharmacy:  Hold Apixaban (Eliquis)     Type of Anesthesia:  Spinal   Additional requests/questions:    [name]   12/18/2021, 4:29 PM

## 2021-12-18 NOTE — Addendum Note (Signed)
Addended by: Orma Render on: 12/18/2021 10:19 AM   Modules accepted: Orders

## 2021-12-18 NOTE — H&P (View-Only) (Signed)
Cardiology Office Note:    Date:  12/18/2021   ID:  Jonathan Woodward, DOB [birth year], MRN [id number]  PCP:  Lonie Peak, PA-C   [facility] Providers Cardiologist:  Thurmon Fair, MD     Referring MD: Lonie Peak, PA-C   Chief Complaint  Patient presents with   Atrial Fibrillation    History of Present Illness:    Jonathan Woodward is a 88 y.o. male with a hx of generally excellent health, mild hypertension, knee osteoarthritis, history of carpal tunnel syndrome and surgery for cataracts who was incidentally found to have atrial fibrillation during a recent appointment with his primary care provider.  He had successful electrical cardioversion on July 7 with improvement in energy level, but with recurrence of atrial fibrillation on July 23.  During atrial fibrillation he requires metoprolol 25 mg daily for rate control, but when he was in sinus rhythm he felt better on half that dose.  He had a recent shoulder injection with improved mobility and reduce pain in his left shoulder.  He is planning to undergo left total knee replacement in the near future, but this is not yet scheduled.  He remains unaware of palpitations, however once he went back into atrial fibrillation he noticed a decline in his energy level.  He denies dyspnea or angina and has not had dizziness or syncope.  Has not had any falls or injuries or serious bleeding problems on Eliquis.  He is unaware of the arrhythmia.  He is remarkably physically active for his age.  He does a lot of yard work, cutting down trees and clearing limbs.  He lives independently with his 86 year old significant other on a large property.  He had COVID-19 infection about a year ago and has noticed that he has mild exertional dyspnea (NYHA functional class II) since then.   Past Medical History:  Diagnosis Date   Atrial fibrillation (HCC)    HTN (hypertension)     Past Surgical History:  Procedure Laterality Date    CARDIOVERSION N/A 10/16/2021   Procedure: CARDIOVERSION;  Surgeon: Chrystie Nose, MD;  Location: [facility];  Service: Cardiovascular;  Laterality: N/A;   CARPAL TUNNEL RELEASE     CATARACT EXTRACTION      Current Medications: Current Meds  Medication Sig   amLODipine (NORVASC) 2.5 MG tablet Take 1 tablet (2.5 mg total) by mouth daily.   apixaban (ELIQUIS) 5 MG TABS tablet Take 5 mg by mouth 2 (two) times daily.   diphenhydrAMINE-APAP, sleep, (TYLENOL PM EXTRA STRENGTH PO) Take 1 tablet by mouth at bedtime as needed (pain/sleep).   Flaxseed, Linseed, (FLAXSEED OIL) 1200 MG CAPS Take 1,200 mg by mouth in the morning.   flecainide (TAMBOCOR) 50 MG tablet Take 1 tablet (50 mg total) by mouth 2 (two) times daily.   losartan (COZAAR) 50 MG tablet Take 50 mg by mouth 2 (two) times daily.   NON FORMULARY Take 1 tablet by mouth in the morning and at bedtime. Advance Joint Health Complex( [place])   Omega-3 Fatty Acids (FISH OIL) 1000 MG CAPS Take 1,000 mg by mouth in the morning.   sennosides-docusate sodium (SENOKOT-S) 8.6-50 MG tablet Take 1 tablet by mouth daily as needed for constipation.   [DISCONTINUED] metoprolol succinate (TOPROL-XL) 25 MG 24 hr tablet Take 1 tablet by mouth daily.     Allergies:   Patient has no known allergies.   Social History   Socioeconomic History   Marital status: Single    Spouse name:  Not on file   Number of children: Not on file   Years of education: Not on file   Highest education level: Not on file  Occupational History   Not on file  Tobacco Use   Smoking status: Former    Types: Cigarettes, Pipe, Cigars    Quit date: 1980    Years since quitting: 43.7   Smokeless tobacco: Former    Types: Chew, Snuff    Quit date: 1980  Substance and Sexual Activity   Alcohol use: Not on file   Drug use: Not on file   Sexual activity: Not on file  Other Topics Concern   Not on file  Social History Narrative   Not on file   Social Determinants of  Health   Financial Resource Strain: Not on file  Food Insecurity: Not on file  Transportation Needs: Not on file  Physical Activity: Not on file  Stress: Not on file  Social Connections: Not on file     Family History: The patient's family history is significantly negative for cardiac illness or stroke  ROS:   Please see the history of present illness.     All other systems reviewed and are negative.  EKGs/Labs/Other Studies Reviewed:    The following studies were reviewed today:   Echocardiogram 10/09/2021    1. Left ventricular ejection fraction, by estimation, is 65 to 70%. The  left ventricle has normal function. The left ventricle has no regional  wall motion abnormalities. There is mild left ventricular hypertrophy.  Left ventricular diastolic parameters  are indeterminate. Elevated left ventricular end-diastolic pressure. The  E/e' is 19.   2. Right ventricular systolic function is mildly reduced. The right  ventricular size is mildly enlarged. There is moderately elevated  pulmonary artery systolic pressure. The estimated right ventricular  systolic pressure is 46.7 mmHg.   3. Left atrial size was severely dilated.   4. Right atrial size was moderately dilated.   5. The mitral valve is grossly normal. Trivial mitral valve  regurgitation. No evidence of mitral stenosis.   6. Tricuspid valve regurgitation is mild to moderate.   7. The aortic valve is grossly normal. There is mild calcification of the  aortic valve. Aortic valve regurgitation is trivial. No aortic stenosis is  present.   8. The inferior vena cava is normal in size with <50% respiratory  variability, suggesting right atrial pressure of 8 mmHg.   EKG:  EKG is ordered today.  The ekg ordered today demonstrates atrial fibrillation with mild RVR, RBBB, no repolarization abnormalities, QTc 463 ms.  Recent Labs: 10/09/2021: BUN 15; Creatinine, Ser 0.79; Hemoglobin 13.1; Platelets 263; Potassium 5.1;  Sodium 134  Recent Lipid Panel No results found for: "CHOL", "TRIG", "HDL", "CHOLHDL", "VLDL", "LDLCALC", "LDLDIRECT" 08/31/2021 Cholesterol 163, HDL 69, LDL 82, triglycerides 62  Risk Assessment/Calculations:    CHA2DS2-VASc Score = 3   This indicates a 3.2% annual risk of stroke. The patient's score is based upon: CHF History: 0 HTN History: 1 Diabetes History: 0 Stroke History: 0 Vascular Disease History: 0 Age Score: 2 Gender Score: 0               Physical Exam:    VS:  BP 139/77   Pulse 90   Ht 5\' 6"  (1.676 m)   Wt 174 lb (78.9 kg)   SpO2 97%   BMI 28.08 kg/m     Wt Readings from Last 3 Encounters:  12/18/21 174 lb (78.9 kg)  11/04/21  171 lb (77.6 kg)  11/03/21 171 lb (77.6 kg)      General: Alert, oriented x3, no distress Head: no evidence of trauma, PERRL, EOMI, no exophtalmos or lid lag, no myxedema, no xanthelasma; normal ears, nose and oropharynx Neck: normal jugular venous pulsations and no hepatojugular reflux; brisk carotid pulses without delay and no carotid bruits Chest: clear to auscultation, no signs of consolidation by percussion or palpation, normal fremitus, symmetrical and full respiratory excursions Cardiovascular: normal position and quality of the apical impulse, irregular rhythm, normal first and second heart sounds, no murmurs, rubs or gallops Abdomen: no tenderness or distention, no masses by palpation, no abnormal pulsatility or arterial bruits, normal bowel sounds, no hepatosplenomegaly Extremities: no clubbing, cyanosis or edema; 2+ radial, ulnar and brachial pulses bilaterally; 2+ right femoral, posterior tibial and dorsalis pedis pulses; 2+ left femoral, posterior tibial and dorsalis pedis pulses; no subclavian or femoral bruits Neurological: grossly nonfocal Psych: Normal mood and affect   ASSESSMENT:    1. Persistent atrial fibrillation (HCC)   2. Essential hypertension   3. Right ventricular dysfunction   4. Acquired  thrombophilia (HCC)   5. Arthritis of left knee      PLAN:    In order of problems listed above:  AFib: It does appear that he had improved functional status when he was back in sinus rhythm, but the arrhythmia recurred after about 2 weeks.  Exact duration of the atrial fibrillation is uncertain, but apparently less than 6 months.  He does not have palpitations.  He does have severe left atrial dilation as well as moderate right atrial dilation.  No evidence of CAD.  Have reviewed the different options for antiarrhythmics.  We will start flecainide 50 mg twice daily and attempt cardioversion again.  We discussed the fact that performing a cardioversion would again delay any surgical interventions by a month. HTN: Usually well controlled.  Unexpectedly high today. RV dysfunction: Echocardiogram does show evidence of mildly reduced right ventricular systolic function and mild right ventricular dilation and the PA pressure is elevated at roughly 47 mmHg.  He does not have symptoms of sleep apnea.  He smoked for a while, but quit 40 years ago and does not have typical findings of COPD.  Developed mild exertional dyspnea following COVID-19 infection last year.  Has RBBB on ECG.  He does not have overt clinical findings of right heart failure/hypervolemia.  He will be on chronic anticoagulation.  I am not entirely sure of the benefit of aggressive work-up for his pulmonary hypertension at this point. Anticoagulation: Well-tolerated, no bleeding problems so far.  Reviewed the fact that if we go ahead with cardioversion, anticoagulants cannot be stopped for 4 weeks thereafter. History of carpal tunnel release: No other findings to raise suspicion for amyloidosis.  Normal voltage on ECG.  Does not have symptoms of heart failure. Left knee pain: He is at low risk for cardiovascular complications with knee surgery.  The surgery will have to wait for roughly 30 days after cardioversion.  He will have to stop the  Eliquis for 2-3 days before this procedure, but it should be resumed as soon as possible after surgery.  This will also protect him from potential complications of DVT/PE   Shared Decision Making/Informed Consent The risks (stroke, cardiac arrhythmias rarely resulting in the need for a temporary or permanent pacemaker, skin irritation or burns and complications associated with conscious sedation including aspiration, arrhythmia, respiratory failure and death), benefits (restoration of normal sinus rhythm)  and alternatives of a direct current cardioversion were explained in detail to Mr. Hackley and he agrees to proceed.     Medication Adjustments/Labs and Tests Ordered: Current medicines are reviewed at length with the patient today.  Concerns regarding medicines are outlined above.  Orders Placed This Encounter  Procedures   Basic metabolic panel   CBC   EKG 12-Lead   Meds ordered this encounter  Medications   metoprolol succinate (TOPROL-XL) 25 MG 24 hr tablet    Sig: Take 1 tablet (25 mg total) by mouth daily.    Dispense:  90 tablet    Refill:  3   flecainide (TAMBOCOR) 50 MG tablet    Sig: Take 1 tablet (50 mg total) by mouth 2 (two) times daily.    Dispense:  60 tablet    Refill:  0    Patient Instructions  Medication Instructions:  START Flecainide 50 mg twice daily  *If you need a refill on your cardiac medications before your next appointment, please call your pharmacy*  Follow-Up: At [facility], you and your health needs are our priority.  As part of our continuing mission to provide you with exceptional heart care, we have created designated Provider Care Teams.  These Care Teams include your primary Cardiologist (physician) and Advanced Practice Providers (APPs -  Physician Assistants and Nurse Practitioners) who all work together to provide you with the care you need, when you need it.  We recommend signing up for the patient portal called "MyChart".  Sign  up information is provided on this After Visit Summary.  MyChart is used to connect with patients for Virtual Visits (Telemedicine).  Patients are able to view lab/test results, encounter notes, upcoming appointments, etc.  Non-urgent messages can be sent to your provider as well.   To learn more about what you can do with MyChart, go to [email or web address].    Your next appointment:   Keep your post procedure follow up on 10/4 at 2:40 with Dr. Royann Shivers Other Instructions  You are scheduled for a Cardioversion on 9/19 with Dr. Jacques Navy.  Please arrive at the [facility] (Main Entrance A) at [facility]: [address] [place], [place] [address] at 9:30 am. (1 hour prior to procedure)  DIET: Nothing to eat or drink after midnight except a sip of water with medications (see medication instructions below)  FYI: For your safety, and to allow [place] to monitor your vital signs accurately during the surgery/procedure we request that   if you have artificial nails, gel coating, SNS etc. Please have those removed prior to your surgery/procedure. Not having the nail coverings /polish removed may result in cancellation or delay of your surgery/procedure.   Medication Instructions: Hold-nothing to hold  Continue your anticoagulant: Eliquis You will need to continue your anticoagulant after your procedure until you  are told by your  Provider that it is safe to stop   Labs:  Your provider would like for you to return  9/12 to have the following labs drawn: CBC and BMET. You do not need an appointment for the lab. Once in our office lobby there is a podium where you can sign in and ring the doorbell to alert [place] that you are here. The lab is open from 8:00 am to 4 pm; closed for lunch from 12:45pm-1:45pm.  You may also go to any of these [facility] locations:   [facility] - [address] ([facility]) - [address]   You must have a responsible person to drive you home and stay in the waiting area during your procedure. Failure to do so could result in cancellation.  Bring your insurance cards.  *Special Note: Every effort is made to have your procedure done on time. Occasionally there are emergencies that occur at the hospital that may cause delays. Please be patient if a delay does occur.      Signed, Thurmon Fair, MD  12/18/2021 10:07 AM    [facility]

## 2021-12-22 ENCOUNTER — Encounter (HOSPITAL_COMMUNITY): Payer: Self-pay | Admitting: Internal Medicine

## 2021-12-22 DIAGNOSIS — I4819 Other persistent atrial fibrillation: Secondary | ICD-10-CM | POA: Diagnosis not present

## 2021-12-22 NOTE — Telephone Encounter (Signed)
I left message for [facility], surgery scheduler for Dr. Zachery Dakins. Please see notes from Christen Bame, NP.

## 2021-12-22 NOTE — Telephone Encounter (Signed)
Preop call back team, please notify requesting provider's office that patient is scheduled for cardioversion on 12/29/21.  He will not be able to hold Eliquis for 4 weeks following cardioversion.  Has follow-up appointment with Dr. Sallyanne Kuster on 01/13/2022.  Would recommend that clearance be addressed at this appointment.  Emmaline Life, NP-C   12/22/2021, 10:14 AM [address] Office [phone] Fax [phone]

## 2021-12-23 LAB — BASIC METABOLIC PANEL
BUN/Creatinine Ratio: 15 (ref 10–24)
BUN: 12 mg/dL (ref 8–27)
CO2: 25 mmol/L (ref 20–29)
Calcium: 9.3 mg/dL (ref 8.6–10.2)
Chloride: 97 mmol/L (ref 96–106)
Creatinine, Ser: 0.79 mg/dL (ref 0.76–1.27)
Glucose: 91 mg/dL (ref 70–99)
Potassium: 4.3 mmol/L (ref 3.5–5.2)
Sodium: 135 mmol/L (ref 134–144)
eGFR: 86 mL/min/{1.73_m2} (ref >59)

## 2021-12-23 LAB — CBC
Hematocrit: 35.5 % — ABNORMAL LOW (ref 37.5–51.0)
Hemoglobin: 12 g/dL — ABNORMAL LOW (ref 13.0–17.7)
MCH: 30.4 pg (ref 26.6–33.0)
MCHC: 33.8 g/dL (ref 31.5–35.7)
MCV: 90 fL (ref 79–97)
Platelets: 230 10*3/uL (ref 150–450)
RBC: 3.95 x10E6/uL — ABNORMAL LOW (ref 4.14–5.80)
RDW: 13.1 % (ref 11.6–15.4)
WBC: 7.1 10*3/uL (ref 3.4–10.8)

## 2021-12-24 ENCOUNTER — Encounter: Payer: Self-pay | Admitting: *Deleted

## 2021-12-24 ENCOUNTER — Encounter: Payer: Self-pay | Admitting: Cardiovascular Disease

## 2021-12-25 ENCOUNTER — Other Ambulatory Visit: Payer: Self-pay | Admitting: *Deleted

## 2021-12-25 DIAGNOSIS — M1712 Unilateral primary osteoarthritis, left knee: Secondary | ICD-10-CM | POA: Diagnosis not present

## 2021-12-25 DIAGNOSIS — I4819 Other persistent atrial fibrillation: Secondary | ICD-10-CM

## 2021-12-28 NOTE — Anesthesia Preprocedure Evaluation (Addendum)
Anesthesia Evaluation  Patient identified by MRN, date of birth, ID band Patient awake    Reviewed: Allergy & Precautions, NPO status , Patient's Chart, lab work & pertinent test results  Airway Mallampati: II  TM Distance: >3 FB Neck ROM: Full    Dental  (+) Dental Advisory Given, Edentulous Upper, Missing   Pulmonary former smoker,    Pulmonary exam normal breath sounds clear to auscultation       Cardiovascular hypertension, Pt. on medications and Pt. on home beta blockers pulmonary hypertension+ dysrhythmias Atrial Fibrillation  Rhythm:Irregular Rate:Tachycardia  10/09/2021 Echo 1. Left ventricular ejection fraction, by estimation, is 65 to 70%. The left ventricle has normal function. The left ventricle has no regional wall motion abnormalities. There is mild left ventricular hypertrophy. Left ventricular diastolic parameters are indeterminate. Elevated left ventricular end-diastolic pressure. The E/e' is 56.  2. Right ventricular systolic function is mildly reduced. The right ventricular size is mildly enlarged. There is moderately elevated pulmonary artery systolic pressure. The estimated right ventricular systolic pressure is 65.5 mmHg.  3. Left atrial size was severely dilated.  4. Right atrial size was moderately dilated.  5. The mitral valve is grossly normal. Trivial mitral valve  regurgitation. No evidence of mitral stenosis.  6. Tricuspid valve regurgitation is mild to moderate.  7. The aortic valve is grossly normal. There is mild calcification of the aortic valve. Aortic valve regurgitation is trivial. No aortic stenosis is present.  8. The inferior vena cava is normal in size with <50% respiratory variability, suggesting right atrial pressure of 8 mmHg.    Neuro/Psych    GI/Hepatic Neg liver ROS, neg GERD  ,  Endo/Other    Renal/GU      Musculoskeletal   Abdominal   Peds  Hematology negative  hematology ROS (+)   Anesthesia Other Findings   Reproductive/Obstetrics                           Anesthesia Physical  Anesthesia Plan  ASA: 4  Anesthesia Plan: General   Post-op Pain Management: Minimal or no pain anticipated   Induction: Intravenous  PONV Risk Score and Plan: 2 and Treatment may vary due to age or medical condition, Propofol infusion and TIVA  Airway Management Planned: Natural Airway and Simple Face Mask  Additional Equipment:   Intra-op Plan:   Post-operative Plan:   Informed Consent: I have reviewed the patients History and Physical, chart, labs and discussed the procedure including the risks, benefits and alternatives for the proposed anesthesia with the patient or authorized representative who has indicated his/her understanding and acceptance.     Dental advisory given  Plan Discussed with: CRNA  Anesthesia Plan Comments:       Anesthesia Quick Evaluation

## 2021-12-29 ENCOUNTER — Other Ambulatory Visit: Payer: Self-pay

## 2021-12-29 ENCOUNTER — Encounter (HOSPITAL_COMMUNITY)
Admission: RE | Disposition: A | Discharge status: Home / Self Care | Payer: Self-pay | Source: Home / Self Care | Attending: Internal Medicine

## 2021-12-29 ENCOUNTER — Encounter (HOSPITAL_COMMUNITY): Payer: Self-pay | Admitting: Internal Medicine

## 2021-12-29 ENCOUNTER — Ambulatory Visit (HOSPITAL_COMMUNITY): Payer: PPO | Admitting: Anesthesiology

## 2021-12-29 ENCOUNTER — Ambulatory Visit (HOSPITAL_BASED_OUTPATIENT_CLINIC_OR_DEPARTMENT_OTHER): Payer: PPO | Admitting: Anesthesiology

## 2021-12-29 ENCOUNTER — Ambulatory Visit (HOSPITAL_COMMUNITY)
Admission: RE | Admit: 2021-12-29 | Discharge: 2021-12-29 | Disposition: A | Discharge status: Home / Self Care | Payer: PPO | Attending: Internal Medicine | Admitting: Internal Medicine

## 2021-12-29 DIAGNOSIS — R0600 Dyspnea, unspecified: Secondary | ICD-10-CM | POA: Diagnosis not present

## 2021-12-29 DIAGNOSIS — Z7901 Long term (current) use of anticoagulants: Secondary | ICD-10-CM | POA: Diagnosis not present

## 2021-12-29 DIAGNOSIS — I4819 Other persistent atrial fibrillation: Secondary | ICD-10-CM | POA: Insufficient documentation

## 2021-12-29 DIAGNOSIS — I4510 Unspecified right bundle-branch block: Secondary | ICD-10-CM | POA: Diagnosis not present

## 2021-12-29 DIAGNOSIS — I10 Essential (primary) hypertension: Secondary | ICD-10-CM | POA: Insufficient documentation

## 2021-12-29 DIAGNOSIS — Z8616 Personal history of COVID-19: Secondary | ICD-10-CM | POA: Insufficient documentation

## 2021-12-29 DIAGNOSIS — I4891 Unspecified atrial fibrillation: Secondary | ICD-10-CM

## 2021-12-29 DIAGNOSIS — Z79899 Other long term (current) drug therapy: Secondary | ICD-10-CM | POA: Insufficient documentation

## 2021-12-29 DIAGNOSIS — Z87891 Personal history of nicotine dependence: Secondary | ICD-10-CM | POA: Diagnosis not present

## 2021-12-29 DIAGNOSIS — M1712 Unilateral primary osteoarthritis, left knee: Secondary | ICD-10-CM | POA: Insufficient documentation

## 2021-12-29 DIAGNOSIS — D6859 Other primary thrombophilia: Secondary | ICD-10-CM | POA: Diagnosis not present

## 2021-12-29 HISTORY — PX: CARDIOVERSION: SHX1299

## 2021-12-29 SURGERY — CARDIOVERSION
Anesthesia: General

## 2021-12-29 MED ORDER — SODIUM CHLORIDE 0.9 % IV SOLN: INTRAVENOUS | Status: DC | PRN

## 2021-12-29 MED ORDER — LIDOCAINE 2% (20 MG/ML) 5 ML SYRINGE
INTRAMUSCULAR | Status: DC | PRN
  Administered 2021-12-29: 80 mg via INTRAVENOUS

## 2021-12-29 MED ORDER — PROPOFOL 10 MG/ML IV BOLUS
INTRAVENOUS | Status: DC | PRN
  Administered 2021-12-29: 60 mg via INTRAVENOUS

## 2021-12-29 NOTE — CV Procedure (Signed)
Procedure: Electrical Cardioversion Indications:  Atrial Fibrillation  Procedure Details:  Consent: Risks of procedure as well as the alternatives and risks of each were explained to the (patient/caregiver).  Consent for procedure obtained.  Time Out: Verified patient identification, verified procedure, site/side was marked, verified correct patient position, special equipment/implants available, medications/allergies/relevent history reviewed, required imaging and test results available. PERFORMED.  Patient placed on cardiac monitor, pulse oximetry, supplemental oxygen as necessary.  Sedation given:  propofol per anesthesia Pacer pads placed anterior and posterior chest.  Cardioverted 1 time(s).  Cardioversion with synchronized biphasic 120J shock.  Evaluation: Findings: Post procedure EKG shows: NSR Complications: None Patient did tolerate procedure well.  Time Spent Directly with the Patient:  30 minutes   Elouise Munroe 12/29/2021, 10:25 AM

## 2021-12-29 NOTE — Transfer of Care (Signed)
Immediate Anesthesia Transfer of Care Note  Patient: Jonathan Woodward  Procedure(s) Performed: CARDIOVERSION  Patient Location: PACU  Anesthesia Type:General  Level of Consciousness: awake, alert  and oriented  Airway & Oxygen Therapy: Patient Spontanous Breathing and Patient connected to face mask oxygen  Post-op Assessment: Report given to RN and Post -op Vital signs reviewed and stable  Post vital signs: Reviewed and stable  Last Vitals:  Vitals Value Taken Time  BP    Temp    Pulse 85 12/29/21 1029  Resp 23 12/29/21 1029  SpO2 100 % 12/29/21 1029  Vitals shown include unvalidated device data.  Last Pain:  Vitals:   12/29/21 0944  TempSrc: Temporal  PainSc: 0-No pain         Complications: No notable events documented.

## 2021-12-29 NOTE — Interval H&P Note (Signed)
History and Physical Interval Note:  12/29/2021 10:04 AM  Jonathan Woodward  has presented today for surgery, with the diagnosis of ATRIAL FIBRILLATION.  The various methods of treatment have been discussed with the patient and family. After consideration of risks, benefits and other options for treatment, the patient has consented to  Procedure(s): CARDIOVERSION (N/A) as a surgical intervention.  The patient's history has been reviewed, patient examined, no change in status, stable for surgery.  I have reviewed the patient's chart and labs.  Questions were answered to the patient's satisfaction.     Elouise Munroe

## 2021-12-30 NOTE — Anesthesia Postprocedure Evaluation (Signed)
Anesthesia Post Note  Patient: Jonathan Woodward  Procedure(s) Performed: CARDIOVERSION     Patient location during evaluation: Endoscopy Anesthesia Type: General Level of consciousness: patient cooperative and awake and alert Pain management: pain level controlled Vital Signs Assessment: post-procedure vital signs reviewed and stable Respiratory status: spontaneous breathing Cardiovascular status: stable Anesthetic complications: no   No notable events documented.  Last Vitals:  Vitals:   12/29/21 1040 12/29/21 1050  BP: (!) 157/85 (!) 154/79  Pulse: 83 82  Resp: (!) 25 20  Temp:    SpO2: 96% 95%    Last Pain:  Vitals:   12/29/21 1050  TempSrc:   PainSc: 0-No pain                 Nolon Nations

## 2022-01-13 ENCOUNTER — Encounter: Payer: Self-pay | Admitting: Cardiovascular Disease

## 2022-01-13 ENCOUNTER — Ambulatory Visit: Payer: PPO | Attending: Cardiovascular Disease | Admitting: Cardiovascular Disease

## 2022-01-13 VITALS — BP 138/60 | HR 77 | Ht 66.0 in | Wt 174.0 lb

## 2022-01-13 DIAGNOSIS — I10 Essential (primary) hypertension: Secondary | ICD-10-CM

## 2022-01-13 DIAGNOSIS — I4819 Other persistent atrial fibrillation: Secondary | ICD-10-CM | POA: Diagnosis not present

## 2022-01-13 DIAGNOSIS — M1712 Unilateral primary osteoarthritis, left knee: Secondary | ICD-10-CM

## 2022-01-13 DIAGNOSIS — I519 Heart disease, unspecified: Secondary | ICD-10-CM | POA: Diagnosis not present

## 2022-01-13 DIAGNOSIS — M069 Rheumatoid arthritis, unspecified: Secondary | ICD-10-CM

## 2022-01-13 DIAGNOSIS — D6869 Other thrombophilia: Secondary | ICD-10-CM | POA: Diagnosis not present

## 2022-01-13 DIAGNOSIS — Z01810 Encounter for preprocedural cardiovascular examination: Secondary | ICD-10-CM | POA: Diagnosis not present

## 2022-01-13 DIAGNOSIS — M0579 Rheumatoid arthritis with rheumatoid factor of multiple sites without organ or systems involvement: Secondary | ICD-10-CM | POA: Diagnosis not present

## 2022-01-13 NOTE — Patient Instructions (Signed)
Medication Instructions:  No changes *If you need a refill on your cardiac medications before your next appointment, please call your pharmacy*   Lab Work: Your provider would like for you to have the following labs today: CRP, ANA, RA and ESR  If you have labs (blood work) drawn today and your tests are completely normal, you will receive your results only by: [place] (if you have MyChart) OR A paper copy in the mail If you have any lab test that is abnormal or we need to change your treatment, we will call you to review the results.   Testing/Procedures: None ordered   Follow-Up: At [facility], you and your health needs are our priority.  As part of our continuing mission to provide you with exceptional heart care, we have created designated Provider Care Teams.  These Care Teams include your primary Cardiologist (physician) and Advanced Practice Providers (APPs -  Physician Assistants and Nurse Practitioners) who all work together to provide you with the care you need, when you need it.  We recommend signing up for the patient portal called "MyChart".  Sign up information is provided on this After Visit Summary.  MyChart is used to connect with patients for Virtual Visits (Telemedicine).  Patients are able to view lab/test results, encounter notes, upcoming appointments, etc.  Non-urgent messages can be sent to your provider as well.   To learn more about what you can do with MyChart, go to [email or web address].    Your next appointment:   6 month(s)  The format for your next appointment:   In Person  Provider:   Sanda Klein, MD     Important Information About Sugar

## 2022-01-13 NOTE — Progress Notes (Signed)
Cardiology Office Note:    Date:  01/15/2022   ID:  Jonathan Woodward, DOB Apr 23, 1932, MRN [id number]  PCP:  Lonie Peak, PA-C   [facility] Providers Cardiologist:  Thurmon Fair, MD     Referring MD: Lonie Peak, PA-C   Chief Complaint  Patient presents with   Atrial Fibrillation    History of Present Illness:    Jonathan Woodward is a 88 y.o. male with a hx of generally excellent health, mild hypertension, knee osteoarthritis, history of carpal tunnel syndrome and surgery for cataracts who was incidentally found to have atrial fibrillation during a recent appointment with his primary care provider.    He had successful electrical cardioversion on July 7 with improvement in energy level, but with recurrence of atrial fibrillation on July 23.  During atrial fibrillation he requires metoprolol 25 mg daily for rate control, but when he was in sinus rhythm he felt better on half that dose.  He had a normal nuclear myocardial perfusion study.  He was subsequently started on flecainide and had a repeat cardioversion performed successfully 12/29/2021.  He remains in sinus rhythm today.  He remains oblivious to the arrhythmia has never helped palpitations.  He really does not notice a difficult change in his functional status or symptoms whether he is in sinus rhythm or atrial fibrillation.  The only time he had any complaints of fatigue was when he was on higher dose beta-blocker and had sinus bradycardia.  Biggest complaints are related to stiffness and limited range of motion in his elbows as well as stiffness in his hands and fingers.  He has not had any bleeding problems on Eliquis, falls or injuries.  He continues to be very active for gentleman his age.  He denies exertional angina or dyspnea, dizziness, palpitations, syncope or any focal neurological events.  He is unaware of the arrhythmia.  He is remarkably physically active for his age.  He does a lot of yard work,  cutting down trees and clearing limbs.  He lives independently with his 86 year old significant other on a large property.  He had COVID-19 infection about a year ago and has noticed that he has mild exertional dyspnea (NYHA functional class II) since then.   Past Medical History:  Diagnosis Date   Atrial fibrillation (HCC)    HTN (hypertension)     Past Surgical History:  Procedure Laterality Date   CARDIOVERSION N/A 10/16/2021   Procedure: CARDIOVERSION;  Surgeon: Chrystie Nose, MD;  Location: [facility] ENDOSCOPY;  Service: Cardiovascular;  Laterality: N/A;   CARDIOVERSION N/A 12/29/2021   Procedure: CARDIOVERSION;  Surgeon: Parke Poisson, MD;  Location: [facility] ENDOSCOPY;  Service: Cardiovascular;  Laterality: N/A;   CARPAL TUNNEL RELEASE     CATARACT EXTRACTION      Current Medications: Current Meds  Medication Sig   amLODipine (NORVASC) 2.5 MG tablet Take 1 tablet (2.5 mg total) by mouth daily.   apixaban (ELIQUIS) 5 MG TABS tablet Take 5 mg by mouth 2 (two) times daily.   diclofenac Sodium (VOLTAREN) 1 % GEL Apply 1 Application topically daily as needed (pain).   diphenhydrAMINE-APAP, sleep, (TYLENOL PM EXTRA STRENGTH PO) Take 1 tablet by mouth at bedtime as needed (pain/sleep).   Flaxseed, Linseed, (FLAXSEED OIL) 1200 MG CAPS Take 1,200 mg by mouth in the morning.   flecainide (TAMBOCOR) 50 MG tablet Take 1 tablet (50 mg total) by mouth 2 (two) times daily.   losartan (COZAAR) 50 MG tablet Take 50 mg by  mouth 2 (two) times daily.   metoprolol succinate (TOPROL-XL) 25 MG 24 hr tablet Take 1 tablet (25 mg total) by mouth daily.   NON FORMULARY Take 1 tablet by mouth in the morning and at bedtime. Advance Joint Health Complex( [place])   Omega-3 Fatty Acids (FISH OIL PO) Take 1,000 mg by mouth in the morning.   sennosides-docusate sodium (SENOKOT-S) 8.6-50 MG tablet Take 1 tablet by mouth daily as needed for constipation.     Allergies:   Patient has no known allergies.   Social  History   Socioeconomic History   Marital status: Single    Spouse name: Not on file   Number of children: Not on file   Years of education: Not on file   Highest education level: Not on file  Occupational History   Not on file  Tobacco Use   Smoking status: Former    Types: Cigarettes, Pipe, Cigars    Quit date: 1980    Years since quitting: 43.7   Smokeless tobacco: Former    Types: Chew, Snuff    Quit date: 1980  Substance and Sexual Activity   Alcohol use: Never   Drug use: Never   Sexual activity: Not on file  Other Topics Concern   Not on file  Social History Narrative   Not on file   Social Determinants of Health   Financial Resource Strain: Not on file  Food Insecurity: Not on file  Transportation Needs: Not on file  Physical Activity: Not on file  Stress: Not on file  Social Connections: Not on file     Family History: The patient's family history is significantly negative for cardiac illness or stroke  ROS:   Please see the history of present illness.     All other systems reviewed and are negative.  EKGs/Labs/Other Studies Reviewed:    The following studies were reviewed today:   Echocardiogram 10/09/2021    1. Left ventricular ejection fraction, by estimation, is 65 to 70%. The  left ventricle has normal function. The left ventricle has no regional  wall motion abnormalities. There is mild left ventricular hypertrophy.  Left ventricular diastolic parameters  are indeterminate. Elevated left ventricular end-diastolic pressure. The  E/e' is 19.   2. Right ventricular systolic function is mildly reduced. The right  ventricular size is mildly enlarged. There is moderately elevated  pulmonary artery systolic pressure. The estimated right ventricular  systolic pressure is 46.7 mmHg.   3. Left atrial size was severely dilated.   4. Right atrial size was moderately dilated.   5. The mitral valve is grossly normal. Trivial mitral valve   regurgitation. No evidence of mitral stenosis.   6. Tricuspid valve regurgitation is mild to moderate.   7. The aortic valve is grossly normal. There is mild calcification of the  aortic valve. Aortic valve regurgitation is trivial. No aortic stenosis is  present.   8. The inferior vena cava is normal in size with <50% respiratory  variability, suggesting right atrial pressure of 8 mmHg.   EKG:  EKG is ordered today.  The ekg ordered today demonstrates atrial fibrillation with mild RVR, RBBB, no repolarization abnormalities, QTc 463 ms.  Recent Labs: 12/22/2021: BUN 12; Creatinine, Ser 0.79; Hemoglobin 12.0; Platelets 230; Potassium 4.3; Sodium 135  Recent Lipid Panel No results found for: "CHOL", "TRIG", "HDL", "CHOLHDL", "VLDL", "LDLCALC", "LDLDIRECT" 08/31/2021 Cholesterol 163, HDL 69, LDL 82, triglycerides 62  Risk Assessment/Calculations:    CHA2DS2-VASc Score = 3  This indicates a 3.2% annual risk of stroke. The patient's score is based upon: CHF History: 0 HTN History: 1 Diabetes History: 0 Stroke History: 0 Vascular Disease History: 0 Age Score: 2 Gender Score: 0            Physical Exam:    VS:  BP 138/60   Pulse 77   Ht 5\' 6"  (1.676 m)   Wt 174 lb (78.9 kg)   SpO2 95%   BMI 28.08 kg/m     Wt Readings from Last 3 Encounters:  01/13/22 174 lb (78.9 kg)  12/29/21 170 lb (77.1 kg)  12/18/21 174 lb (78.9 kg)      General: Alert, oriented x3, no distress Head: no evidence of trauma, PERRL, EOMI, no exophtalmos or lid lag, no myxedema, no xanthelasma; normal ears, nose and oropharynx Neck: normal jugular venous pulsations and no hepatojugular reflux; brisk carotid pulses without delay and no carotid bruits Chest: clear to auscultation, no signs of consolidation by percussion or palpation, normal fremitus, symmetrical and full respiratory excursions Cardiovascular: normal position and quality of the apical impulse, irregular rhythm, normal first and second  heart sounds, no murmurs, rubs or gallops Abdomen: no tenderness or distention, no masses by palpation, no abnormal pulsatility or arterial bruits, normal bowel sounds, no hepatosplenomegaly Extremities: no clubbing, cyanosis or edema; distal pulses.  He has limited range of motion in his PIPs and MCPs and some ulnar deviation of the hands bilaterally.  He has stiffness and limited range of motion in both elbows.  There is no obvious erythema but the joints appear swollen. Neurological: grossly nonfocal Psych: Normal mood and affect   ASSESSMENT:    1. Persistent atrial fibrillation (HCC)   2. Essential hypertension   3. Right ventricular dysfunction   4. Acquired thrombophilia (HCC)   5. Preoperative cardiovascular examination   6. Arthritis of left knee   7. Rheumatoid arthritis involving multiple sites with positive rheumatoid factor (HCC)       PLAN:    In order of problems listed above:  AFib: Not clear that the return of sinus rhythm has had a big impact on improving his functional status.  Tolerating flecainide and metoprolol without side effects.  It is not clear that he will benefit from long-term rhythm management strategy so would not go to extreme measures or toxic medications to maintain sinus rhythm.  Okay to interrupt anticoagulation temporarily 1 month after cardioversion (starting October 19).   HTN: Fair control. RV dysfunction: Echocardiogram does show evidence of mildly reduced right ventricular systolic function and mild right ventricular dilation and the PA pressure is elevated at roughly 47 mmHg.  He does not have symptoms of sleep apnea or any signs of right heart failure.  He smoked for a while, but quit 40 years ago and does not have typical findings of COPD.  Developed mild exertional dyspnea following COVID-19 infection last year.  Has RBBB on ECG.  He does not have overt clinical findings of right heart failure/hypervolemia.  He will be on chronic  anticoagulation.  I am not entirely sure of the benefit of aggressive work-up for his pulmonary hypertension at this point. Anticoagulation: Well-tolerated, no bleeding problems so far.  Do not interrupt anticoagulants until 1 after his cardioversion (until October 19). History of carpal tunnel release: No other findings to raise suspicion for amyloidosis.  Normal voltage on ECG.  Does not have symptoms of heart failure. Preop CV exam/left knee surgery: He is at low risk for  cardiovascular complications with knee surgery.  Okay to schedule the procedure at any point after October 19.  He will have to stop the Eliquis for 2-3 days before this procedure, but it should be resumed as soon as possible after surgery.  This will also protect him from potential complications of DVT/PE. Suspicion of rheumatoid arthritis: Has fairly typical findings in the MCP and PIP joints and ulnar deviation bilaterally; most severe symptoms seem to currently be in his elbows bilaterally.  We will check rheumatoid factor, inflammatory markers, ANA.  Refer to rheumatology if appropriate.  Would benefit from hand x-rays.   Shared Decision Making/Informed Consent The risks (stroke, cardiac arrhythmias rarely resulting in the need for a temporary or permanent pacemaker, skin irritation or burns and complications associated with conscious sedation including aspiration, arrhythmia, respiratory failure and death), benefits (restoration of normal sinus rhythm) and alternatives of a direct current cardioversion were explained in detail to Mr. Pacheco and he agrees to proceed.     Medication Adjustments/Labs and Tests Ordered: Current medicines are reviewed at length with the patient today.  Concerns regarding medicines are outlined above.  Orders Placed This Encounter  Procedures   Rheumatoid factor   Sedimentation rate   C-reactive protein   ANA w/Reflex   EKG 12-Lead   No orders of the defined types were placed in this  encounter.   Patient Instructions  Medication Instructions:  No changes *If you need a refill on your cardiac medications before your next appointment, please call your pharmacy*   Lab Work: Your provider would like for you to have the following labs today: CRP, ANA, RA and ESR  If you have labs (blood work) drawn today and your tests are completely normal, you will receive your results only by: MyChart Message (if you have MyChart) OR A paper copy in the mail If you have any lab test that is abnormal or we need to change your treatment, we will call you to review the results.   Testing/Procedures: None ordered   Follow-Up: At [facility], you and your health needs are our priority.  As part of our continuing mission to provide you with exceptional heart care, we have created designated Provider Care Teams.  These Care Teams include your primary Cardiologist (physician) and Advanced Practice Providers (APPs -  Physician Assistants and Nurse Practitioners) who all work together to provide you with the care you need, when you need it.  We recommend signing up for the patient portal called "MyChart".  Sign up information is provided on this After Visit Summary.  MyChart is used to connect with patients for Virtual Visits (Telemedicine).  Patients are able to view lab/test results, encounter notes, upcoming appointments, etc.  Non-urgent messages can be sent to your provider as well.   To learn more about what you can do with MyChart, go to [email or web address].    Your next appointment:   6 month(s)  The format for your next appointment:   In Person  Provider:   Thurmon Fair, MD     Important Information About Sugar         Signed, Thurmon Fair, MD  01/15/2022 5:57 PM    [facility]

## 2022-01-14 ENCOUNTER — Other Ambulatory Visit: Payer: Self-pay | Admitting: *Deleted

## 2022-01-14 DIAGNOSIS — M1712 Unilateral primary osteoarthritis, left knee: Secondary | ICD-10-CM

## 2022-01-14 LAB — C-REACTIVE PROTEIN: CRP: 77 mg/L — ABNORMAL HIGH (ref 0–10)

## 2022-01-14 LAB — ANA W/REFLEX: ANA Titer 1: NEGATIVE

## 2022-01-14 LAB — RHEUMATOID FACTOR: Rheumatoid fact SerPl-aCnc: 162.1 IU/mL — ABNORMAL HIGH (ref <14.0)

## 2022-01-14 LAB — SEDIMENTATION RATE: Sed Rate: 95 mm/hr — ABNORMAL HIGH (ref 0–30)

## 2022-01-15 ENCOUNTER — Encounter: Payer: Self-pay | Admitting: Cardiovascular Disease

## 2022-01-18 ENCOUNTER — Other Ambulatory Visit: Payer: Self-pay

## 2022-01-18 ENCOUNTER — Encounter: Payer: Self-pay | Admitting: Cardiovascular Disease

## 2022-01-18 MED ORDER — FLECAINIDE ACETATE 50 MG PO TABS: 50.0000 mg | ORAL_TABLET | Freq: Two times a day (BID) | ORAL | 1 refills | Status: DC

## 2022-01-21 ENCOUNTER — Encounter: Payer: Self-pay | Admitting: Cardiovascular Disease

## 2022-01-21 DIAGNOSIS — M0579 Rheumatoid arthritis with rheumatoid factor of multiple sites without organ or systems involvement: Secondary | ICD-10-CM

## 2022-01-25 DIAGNOSIS — M25812 Other specified joint disorders, left shoulder: Secondary | ICD-10-CM | POA: Diagnosis not present

## 2022-01-25 DIAGNOSIS — M25511 Pain in right shoulder: Secondary | ICD-10-CM | POA: Diagnosis not present

## 2022-02-10 DIAGNOSIS — M1712 Unilateral primary osteoarthritis, left knee: Secondary | ICD-10-CM | POA: Diagnosis not present

## 2022-02-11 NOTE — H&P (Signed)
KNEE ARTHROPLASTY ADMISSION H&P  Patient ID: Jonathan Woodward MRN: [id number] DOB/AGE: 20-Dec-1932 88 y.o.  Chief Complaint: left knee pain.  Planned Procedure Date: 03/03/22  Medical Clearance by Lonie Peak PA-C   Cardiac Clearance by Dr. Royann Shivers   HPI: Jonathan Woodward is a 88 y.o. male who presents for evaluation of OA LEFT KNEE. The patient has a history of pain and functional disability in the left knee due to arthritis and has failed non-surgical conservative treatments for greater than 12 weeks to include NSAID's and/or analgesics, corticosteriod injections, use of assistive devices, and activity modification.  Onset of symptoms was gradual, starting >10 years ago with gradually worsening course since that time. The patient noted no past surgery on the left knee.  Patient currently rates pain at 10 out of 10 with activity. Patient has night pain, worsening of pain with activity and weight bearing, and pain that interferes with activities of daily living.  Patient has evidence of subchondral sclerosis, periarticular osteophytes, joint subluxation, and joint space narrowing by imaging studies.  There is no active infection.  Past Medical History:  Diagnosis Date   Atrial fibrillation (HCC)    HTN (hypertension)    Past Surgical History:  Procedure Laterality Date   CARDIOVERSION N/A 10/16/2021   Procedure: CARDIOVERSION;  Surgeon: Chrystie Nose, MD;  Location: [facility] ENDOSCOPY;  Service: Cardiovascular;  Laterality: N/A;   CARDIOVERSION N/A 12/29/2021   Procedure: CARDIOVERSION;  Surgeon: Parke Poisson, MD;  Location: [facility] ENDOSCOPY;  Service: Cardiovascular;  Laterality: N/A;   CARPAL TUNNEL RELEASE     CATARACT EXTRACTION     No Known Allergies Prior to Admission medications   Medication Sig Start Date End Date Taking? Authorizing Provider  amLODipine (NORVASC) 2.5 MG tablet Take 1 tablet (2.5 mg total) by mouth daily. 10/21/21 10/16/22  Croitoru, Mihai, MD  apixaban  (ELIQUIS) 5 MG TABS tablet Take 5 mg by mouth 2 (two) times daily. 08/31/21   [provider]  diclofenac Sodium (VOLTAREN) 1 % GEL Apply 1 Application topically daily as needed (pain).    [provider]  diphenhydrAMINE-APAP, sleep, (TYLENOL PM EXTRA STRENGTH PO) Take 1 tablet by mouth at bedtime as needed (pain/sleep).    [provider]  Flaxseed, Linseed, (FLAXSEED OIL) 1200 MG CAPS Take 1,200 mg by mouth in the morning.    [provider]  flecainide (TAMBOCOR) 50 MG tablet Take 1 tablet (50 mg total) by mouth 2 (two) times daily. Please contact the office to schedule appointment for additional refills. 01/18/22   Croitoru, Mihai, MD  losartan (COZAAR) 50 MG tablet Take 50 mg by mouth 2 (two) times daily. 07/20/21   [provider]  metoprolol succinate (TOPROL-XL) 25 MG 24 hr tablet Take 1 tablet (25 mg total) by mouth daily. 12/18/21   Croitoru, Mihai, MD  NON FORMULARY Take 1 tablet by mouth in the morning and at bedtime. Advance Joint Health Complex( [place])    [provider]  Omega-3 Fatty Acids (FISH OIL PO) Take 1,000 mg by mouth in the morning.    [provider]  sennosides-docusate sodium (SENOKOT-S) 8.6-50 MG tablet Take 1 tablet by mouth daily as needed for constipation.    [provider]   Social History   Socioeconomic History   Marital status: Single    Spouse name: Not on file   Number of children: Not on file   Years of education: Not on file   Highest education level: Not on file  Occupational History   Not on file  Tobacco Use   Smoking status: Former    Types: Cigarettes, Pipe, Cigars    Quit date: 1980    Years since quitting: 43.8   Smokeless tobacco: Former    Types: Chew, Snuff    Quit date: 1980  Substance and Sexual Activity   Alcohol use: Never   Drug use: Never   Sexual activity: Not on file  Other Topics Concern   Not on file  Social History Narrative   Not on file   Social  Determinants of Health   Financial Resource Strain: Not on file  Food Insecurity: Not on file  Transportation Needs: Not on file  Physical Activity: Not on file  Stress: Not on file  Social Connections: Not on file   No family history on file.  ROS: Currently denies lightheadedness, dizziness, Fever, chills, CP, SOB.   No personal history of DVT, PE, MI, or CVA. No loose teeth. + partial dentures present All other systems have been reviewed and were otherwise currently negative with the exception of those mentioned in the HPI and as above.  Objective: Vitals: Ht: 5'6" Wt: 169.8 lbs Temp: 98.2 BP: 173/78 Pulse: 73 O2 95% on room air.   Physical Exam: General: Alert, NAD.  Antalgic Gait  HEENT: EOMI, Good Neck Extension  Pulm: No increased work of breathing.  Clear B/L A/P w/o crackle or wheeze.  CV: RRR, No m/g/r appreciated  GI: soft, NT, ND. BS x 4 quadrants Neuro: CN II-XII grossly intact without focal deficit.  Sensation intact distally Skin: No lesions in the area of chief complaint MSK/Surgical Site: + JLT. ROM 5-100 degrees. Good strength in extension and flexion.  +EHL/FHL.  NVI.  Stable varus and valgus stress.    Imaging Review Plain radiographs demonstrate severe degenerative joint disease of the bilateral knee.   The overall alignment issignificant varus. The bone quality appears to be fair for age and reported activity level.  Preoperative templating of the joint replacement has been completed, documented, and submitted to the Operating Room personnel in order to optimize intra-operative equipment management.  Assessment: OA LEFT KNEE Active Problems:   * No active hospital problems. *   Plan: Plan for Procedure(s): TOTAL KNEE ARTHROPLASTY  The patient history, physical exam, clinical judgement of the provider and imaging are consistent with end stage degenerative joint disease and total joint arthroplasty is deemed medically necessary. The treatment options  including medical management, injection therapy, and arthroplasty were discussed at length. The risks and benefits of Procedure(s): TOTAL KNEE ARTHROPLASTY were presented and reviewed.  The risks of nonoperative treatment, versus surgical intervention including but not limited to continued pain, aseptic loosening, stiffness, dislocation/subluxation, infection, bleeding, nerve injury, blood clots, cardiopulmonary complications, morbidity, mortality, among others were discussed. The patient verbalizes understanding and wishes to proceed with the plan.  Patient is being admitted for inpatient treatment for surgery, pain control, PT, prophylactic antibiotics, VTE prophylaxis, progressive ambulation, ADL's and discharge planning. He will spend the night in observation.  Dental prophylaxis discussed and recommended for 2 years postoperatively.  The patient does meet the criteria for TXA which will be used perioperatively.   His normal daily Eliquis will be used postoperatively for DVT prophylaxis in addition to SCDs, and early ambulation. Plan for Tylenol and oxycodone for pain.     Zofran for nausea and vomiting. Colace for constipation prevention Pharmacy- [facility] The patient is planning to be discharged home with HHPT ([facility]) and into  the care of his daughter Jonathan Woodward who can be reached at [phone] Follow up appt 03/19/22 at 4:30pm     Marzetta Board Office [phone] 02/11/2022 3:34 PM

## 2022-02-11 NOTE — Progress Notes (Signed)
Sent message, via epic in basket, requesting order in epic from Psychologist, sport and exercise.    02/11/22 1530  Preop Orders  Has preop orders? No  Name of staff/physician contacted for orders(Indicate phone or IB message) M. Gawne, PA-C.

## 2022-02-17 NOTE — Patient Instructions (Signed)
SURGICAL WAITING ROOM VISITATION Patients having surgery or a procedure may have no more than 2 support people in the waiting area - these visitors may rotate in the visitor waiting room.   Children under the age of 42 must have an adult with them who is not the patient. If the patient needs to stay at the hospital during part of their recovery, the visitor guidelines for inpatient rooms apply.  PRE-OP VISITATION  Pre-op nurse will coordinate an appropriate time for 1 support person to accompany the patient in pre-op.  This support person may not rotate.  This visitor will be contacted when the time is appropriate for the visitor to come back in the pre-op area.  Please refer to the [facility] website for the visitor guidelines for Inpatients (after your surgery is over and you are in a regular room).  You are not required to quarantine at this time prior to your surgery. However, you must do this: Hand Hygiene often Do NOT share personal items Notify your provider if you are in close contact with someone who has COVID or you develop fever 100.4 or greater, new onset of sneezing, cough, sore throat, shortness of breath or body aches.  If you test positive for Covid or have been in contact with anyone that has tested positive in the last 10 days please notify you surgeon.    Your procedure is scheduled on: Wednesday  March 03, 2022  Report to [facility] Main Entrance: [facility] entrance where the [employer] is available.   Report to admitting at: 05:15 AM  +++++Call this number if you have any questions or problems the morning of surgery [phone]  Do not eat food after Midnight the night prior to your surgery/procedure.  After Midnight you may have the following liquids until  04:30 AM DAY OF SURGERY  Clear Liquid Diet Water Black Coffee (sugar ok, NO MILK/CREAM OR CREAMERS)  Tea (sugar ok, NO MILK/CREAM OR CREAMERS) regular and decaf                              Plain Jell-O  with no fruit (NO RED)                                           Fruit ices (not with fruit pulp, NO RED)                                     Popsicles (NO RED)                                                                  Juice: apple, WHITE grape, WHITE cranberry Sports drinks like Gatorade or Powerade (NO RED)                   The day of surgery:  Drink ONE (1) Pre-Surgery Clear Ensure at 04:30  AM the morning of surgery. Drink in one sitting. Do not sip.  This drink was given to you during your hospital  pre-op appointment visit. Nothing else to drink after completing the Pre-Surgery Clear Ensure: No candy, chewing gum or throat lozenges.    FOLLOW  ANY ADDITIONAL PRE OP INSTRUCTIONS YOU RECEIVED FROM YOUR SURGEON'S OFFICE!!!   Oral Hygiene is also important to reduce your risk of infection.        Remember - BRUSH YOUR TEETH THE MORNING OF SURGERY WITH YOUR REGULAR TOOTHPASTE   Take ONLY these medicines the morning of surgery with A SIP OF WATER: flecainide (Tambocor), metoprolol, amlodipine                   You may not have any metal on your body including  jewelry, and body piercing  Do not wear  lotions, powders, cologne, or deodorant  Men may shave face and neck.  Contacts, Hearing Aids, dentures or bridgework may not be worn into surgery.   You may bring a small overnight bag with you on the day of surgery, only pack items that are not valuable .[facility] IS NOT RESPONSIBLE   FOR VALUABLES THAT ARE LOST OR STOLEN.   Do not bring your home medications to the hospital. The Pharmacy will dispense medications listed on your medication list to you during your admission in the Hospital.  Special Instructions: Bring a copy of your healthcare power of attorney and living will documents the day of surgery, if you wish to have them scanned into your [facility] Records- EPIC  Please read over the following fact sheets you were given: IF YOU HAVE  QUESTIONS ABOUT YOUR PRE-OP INSTRUCTIONS, PLEASE CALL [phone]  ([place])   [facility] - Preparing for Surgery Before surgery, you can play an important role.  Because skin is not sterile, your skin needs to be as free of germs as possible.  You can reduce the number of germs on your skin by washing with CHG (chlorahexidine gluconate) soap before surgery.  CHG is an antiseptic cleaner which kills germs and bonds with the skin to continue killing germs even after washing. Please DO NOT use if you have an allergy to CHG or antibacterial soaps.  If your skin becomes reddened/irritated stop using the CHG and inform your nurse when you arrive at Short Stay. Do not shave (including legs and underarms) for at least 48 hours prior to the first CHG shower.  You may shave your face/neck.  Please follow these instructions carefully:  1.  Shower with CHG Soap the night before surgery and the  morning of surgery.  2.  If you choose to wash your hair, wash your hair first as usual with your normal  shampoo.  3.  After you shampoo, rinse your hair and body thoroughly to remove the shampoo.                             4.  Use CHG as you would any other liquid soap.  You can apply chg directly to the skin and wash.  Gently with a scrungie or clean washcloth.  5.  Apply the CHG Soap to your body ONLY FROM THE NECK DOWN.   Do not use on face/ open                           Wound or open sores. Avoid contact with eyes, ears mouth and genitals (private parts).  Wash face,  Genitals (private parts) with your normal soap.             6.  Wash thoroughly, paying special attention to the area where your  surgery  will be performed.  7.  Thoroughly rinse your body with warm water from the neck down.  8.  DO NOT shower/wash with your normal soap after using and rinsing off the CHG Soap.            9.  Pat yourself dry with a clean towel.            10.  Wear clean pajamas.            11.  Place clean  sheets on your bed the night of your first shower and do not  sleep with pets.  ON THE DAY OF SURGERY : Do not apply any lotions/deodorants the morning of surgery.  Please wear clean clothes to the hospital/surgery center.    FAILURE TO FOLLOW THESE INSTRUCTIONS MAY RESULT IN THE CANCELLATION OF YOUR SURGERY  PATIENT SIGNATURE_________________________________  NURSE SIGNATURE__________________________________  ________________________________________________________________________        Adam Phenix    An incentive spirometer is a tool that can help keep your lungs clear and active. This tool measures how well you are filling your lungs with each breath. Taking long deep breaths may help reverse or decrease the chance of developing breathing (pulmonary) problems (especially infection) following: A long period of time when you are unable to move or be active. BEFORE THE PROCEDURE  If the spirometer includes an indicator to show your best effort, your nurse or respiratory therapist will set it to a desired goal. If possible, sit up straight or lean slightly forward. Try not to slouch. Hold the incentive spirometer in an upright position. INSTRUCTIONS FOR USE  Sit on the edge of your bed if possible, or sit up as far as you can in bed or on a chair. Hold the incentive spirometer in an upright position. Breathe out normally. Place the mouthpiece in your mouth and seal your lips tightly around it. Breathe in slowly and as deeply as possible, raising the piston or the ball toward the top of the column. Hold your breath for 3-5 seconds or for as long as possible. Allow the piston or ball to fall to the bottom of the column. Remove the mouthpiece from your mouth and breathe out normally. Rest for a few seconds and repeat Steps 1 through 7 at least 10 times every 1-2 hours when you are awake. Take your time and take a few normal breaths between deep breaths. The spirometer may  include an indicator to show your best effort. Use the indicator as a goal to work toward during each repetition. After each set of 10 deep breaths, practice coughing to be sure your lungs are clear. If you have an incision (the cut made at the time of surgery), support your incision when coughing by placing a pillow or rolled up towels firmly against it. Once you are able to get out of bed, walk around indoors and cough well. You may stop using the incentive spirometer when instructed by your caregiver.  RISKS AND COMPLICATIONS Take your time so you do not get dizzy or light-headed. If you are in pain, you may need to take or ask for pain medication before doing incentive spirometry. It is harder to take a deep breath if you are having pain. AFTER USE Rest and breathe slowly  and easily. It can be helpful to keep track of a log of your progress. Your caregiver can provide you with a simple table to help with this. If you are using the spirometer at home, follow these instructions: [place] IF:  You are having difficultly using the spirometer. You have trouble using the spirometer as often as instructed. Your pain medication is not giving enough relief while using the spirometer. You develop fever of 100.5 F (38.1 C) or higher.                                                                                                    SEEK IMMEDIATE MEDICAL CARE IF:  You cough up bloody sputum that had not been present before. You develop fever of 102 F (38.9 C) or greater. You develop worsening pain at or near the incision site. MAKE SURE YOU:  Understand these instructions. Will watch your condition. Will get help right away if you are not doing well or get worse. Document Released: 08/09/2006 Document Revised: 06/21/2011 Document Reviewed: 10/10/2006 [facility] Patient Information 2014 [place], [place].

## 2022-02-17 NOTE — Progress Notes (Signed)
COVID Vaccine received:  _0  No _1  Yes Date of any COVID positive Test in last 90 days: Yes had Covid on 01-10-22  PCP - Cyndi Bender PA-C [facility] [phone]   fax) [phone]  Cardiologist - Sanda Klein, MD  Chest x-ray -  EKG -  01-13-2022  Epic Stress Test - Carlton Adam - 11-04-2021  Epic ECHO - 10-10-2021  Epic Cardiac Cath -   PCR screen: _2  Ordered & Completed                      _3   No Order but Needs PROFEND                      _4   N/A for this surgery  Surgery Plan:  _5  Ambulatory                            _6  Outpatient in bed                            _7  Admit  Anesthesia:    _8  General  _9  Spinal                           _10   Choice _11   MAC  Pacemaker / ICD device _12  No _13  Yes        Device order form faxed _14  No    _15   Yes      Faxed to:  Spinal Cord Stimulator:_16  No _17  Yes      (Remind patient to bring remote DOS) Other Implants:   History of Sleep Apnea? _18  No _19  Yes   CPAP used?- _20  No _21  Yes    Does the patient monitor blood sugar? _22  No _23  Yes  _24  N/A  Blood Thinner / Instructions: Eliquis  hold 2-3 days okay'd with Dr. Sallyanne Kuster. 01-13-22 notes Aspirin Instructions: no  ERAS Protocol Ordered: _25  No  _26  Yes PRE-SURGERY _27  ENSURE  _28  G2  _29  No Drink Ordered  Patient is to be NPO after: 04:30 am  Comments: Patient has glaucoma and RA (multiple sites)  Activity level: Patient can not climb a flight of stairs without difficulty; Patient can not perform ADLs without assistance.   Anesthesia review: HTN, A.fib (cardioversions x 2), Palps,  Patient denies shortness of breath, fever, cough and chest pain at PAT appointment.  Patient verbalized understanding and agreement to the Pre-Surgical Instructions that were given to them at this PAT appointment. Patient was also educated of the need to review these PAT instructions again prior to his/her surgery.I reviewed the appropriate phone numbers to call  if they have any and questions or concerns.

## 2022-02-18 ENCOUNTER — Encounter (HOSPITAL_COMMUNITY)
Admission: RE | Admit: 2022-02-18 | Discharge: 2022-02-18 | Disposition: A | Discharge status: Home / Self Care | Payer: PPO | Source: Ambulatory Visit | Attending: Orthopedic Surgery | Admitting: Orthopedic Surgery

## 2022-02-18 ENCOUNTER — Other Ambulatory Visit: Payer: Self-pay

## 2022-02-18 ENCOUNTER — Encounter (HOSPITAL_COMMUNITY): Payer: Self-pay

## 2022-02-18 VITALS — BP 174/78 | HR 72 | Temp 98.6°F | Resp 16 | Ht 66.0 in | Wt 169.0 lb

## 2022-02-18 DIAGNOSIS — Z01818 Encounter for other preprocedural examination: Secondary | ICD-10-CM

## 2022-02-18 DIAGNOSIS — Z01812 Encounter for preprocedural laboratory examination: Secondary | ICD-10-CM | POA: Insufficient documentation

## 2022-02-18 DIAGNOSIS — I10 Essential (primary) hypertension: Secondary | ICD-10-CM | POA: Insufficient documentation

## 2022-02-18 HISTORY — DX: Unspecified glaucoma: H40.9

## 2022-02-18 HISTORY — DX: Unspecified osteoarthritis, unspecified site: M19.90

## 2022-02-18 LAB — CBC
HCT: 35.4 % — ABNORMAL LOW (ref 39.0–52.0)
Hemoglobin: 11.1 g/dL — ABNORMAL LOW (ref 13.0–17.0)
MCH: 28.8 pg (ref 26.0–34.0)
MCHC: 31.4 g/dL (ref 30.0–36.0)
MCV: 91.7 fL (ref 80.0–100.0)
Platelets: 210 10*3/uL (ref 150–400)
RBC: 3.86 MIL/uL — ABNORMAL LOW (ref 4.22–5.81)
RDW: 14.5 % (ref 11.5–15.5)
WBC: 6 10*3/uL (ref 4.0–10.5)
nRBC: 0 % (ref 0.0–0.2)

## 2022-02-18 LAB — BASIC METABOLIC PANEL
Anion gap: 5 (ref 5–15)
BUN: 14 mg/dL (ref 8–23)
CO2: 29 mmol/L (ref 22–32)
Calcium: 8.8 mg/dL — ABNORMAL LOW (ref 8.9–10.3)
Chloride: 100 mmol/L (ref 98–111)
Creatinine, Ser: 0.75 mg/dL (ref 0.61–1.24)
GFR, Estimated: >60 mL/min (ref >60)
Glucose, Bld: 129 mg/dL — ABNORMAL HIGH (ref 70–99)
Potassium: 4 mmol/L (ref 3.5–5.1)
Sodium: 134 mmol/L — ABNORMAL LOW (ref 135–145)

## 2022-02-18 LAB — SURGICAL PCR SCREEN
MRSA, PCR: NEGATIVE
Staphylococcus aureus: NEGATIVE

## 2022-02-22 NOTE — Progress Notes (Signed)
Anesthesia Chart Review   Case: [id number] Date/Time: 03/03/22 0715   Procedure: TOTAL KNEE ARTHROPLASTY (Left: Knee)   Anesthesia type: Spinal   Pre-op diagnosis: OA LEFT KNEE   Location: [facility] ROOM 09 / [facility]   Surgeons: Willaim Sheng, MD       DISCUSSION:88 y.o. former smoker with h/o HTN, atrial fibrillation, left knee OA scheduled for above procedure 03/03/2022 with Dr. Charlies Constable.   Pt last seen by cardiology 01/13/2022. Per OV note, "He is at low risk for cardiovascular complications with knee surgery.  Okay to schedule the procedure at any point after October 19.  He will have to stop the Eliquis for 2-3 days before this procedure, but it should be resumed as soon as possible after surgery.  This will also protect him from potential complications of DVT/PE. "  Pt reports to PAT nurse last dose of Eliquis will be 02/28/22.   Anticipate pt can proceed with planned procedure barring acute status change.   VS: BP (!) 174/78 Comment: right arm sitting  Pulse 72   Temp 37 C   Resp 16   Ht '5\' 6"'$  (1.676 m)   Wt 76.7 kg   SpO2 98%   BMI 27.28 kg/m   PROVIDERS: Cyndi Bender, PA-C is PCP   Cardiologist - Sanda Klein, MD  LABS: Labs reviewed: Acceptable for surgery. (all labs ordered are listed, but only abnormal results are displayed)  Labs Reviewed  BASIC METABOLIC PANEL - Abnormal; Notable for the following components:      Result Value   Sodium 134 (*)    Glucose, Bld 129 (*)    Calcium 8.8 (*)    All other components within normal limits  CBC - Abnormal; Notable for the following components:   RBC 3.86 (*)    Hemoglobin 11.1 (*)    HCT 35.4 (*)    All other components within normal limits  SURGICAL PCR SCREEN     IMAGES:   EKG:   CV: Myocardial Perfusion 11/04/2021   Findings are consistent with no prior ischemia. The study is low risk.   No ST deviation was noted.   Left ventricular function is abnormal. Global function is mildly reduced.  Nuclear stress EF: 49 %. The left ventricular ejection fraction is mildly decreased (45-54%). End diastolic cavity size is normal.   Prior study not available for comparison.   Low risk stress nuclear study with normal perfusion. There is mild reduction in left ventricular systolic function, but this may be related to poor gating in the setting of atrial fibrillation.  Echo 10/09/2021  1. Left ventricular ejection fraction, by estimation, is 65 to 70%. The  left ventricle has normal function. The left ventricle has no regional  wall motion abnormalities. There is mild left ventricular hypertrophy.  Left ventricular diastolic parameters  are indeterminate. Elevated left ventricular end-diastolic pressure. The  E/e' is 54.   2. Right ventricular systolic function is mildly reduced. The right  ventricular size is mildly enlarged. There is moderately elevated  pulmonary artery systolic pressure. The estimated right ventricular  systolic pressure is 26.7 mmHg.   3. Left atrial size was severely dilated.   4. Right atrial size was moderately dilated.   5. The mitral valve is grossly normal. Trivial mitral valve  regurgitation. No evidence of mitral stenosis.   6. Tricuspid valve regurgitation is mild to moderate.   7. The aortic valve is grossly normal. There is mild calcification of the  aortic valve.  Aortic valve regurgitation is trivial. No aortic stenosis is  present.   8. The inferior vena cava is normal in size with <50% respiratory  variability, suggesting right atrial pressure of 8 mmHg.  Past Medical History:  Diagnosis Date   Arthritis    Atrial fibrillation (HCC)    Glaucoma    HTN (hypertension)     Past Surgical History:  Procedure Laterality Date   CARDIOVERSION N/A 10/16/2021   Procedure: CARDIOVERSION;  Surgeon: [name], MD;  Location: [facility];  Service: Cardiovascular;  Laterality: N/A;   CARDIOVERSION N/A 12/29/2021   Procedure: CARDIOVERSION;   Surgeon: Elouise Munroe, MD;  [facility] ENDOSCOPY;  Service: Cardiovascular;  Laterality: N/A;   CATARACT EXTRACTION      MEDICATIONS:  amLODipine (NORVASC) 2.5 MG tablet   apixaban (ELIQUIS) 5 MG TABS tablet   diphenhydrAMINE-APAP, sleep, (TYLENOL PM EXTRA STRENGTH PO)   Flaxseed, Linseed, (FLAXSEED OIL) 1000 MG CAPS   flecainide (TAMBOCOR) 50 MG tablet   losartan (COZAAR) 50 MG tablet   metoprolol succinate (TOPROL-XL) 25 MG 24 hr tablet   NON FORMULARY   Omega-3 Fatty Acids (FISH OIL) 1000 MG CAPS   sennosides-docusate sodium (SENOKOT-S) 8.6-50 MG tablet   No current facility-administered medications for this encounter.   Konrad Felix Ward, PA-C [facility] Pre-Surgical Testing [phone]

## 2022-02-24 NOTE — Care Plan (Signed)
Ortho Bundle Case Management Note  Patient Details  Name: Jonathan Woodward MRN: [id number] Date of Birth: 1933-01-31  patient seen in the office today for H&P. he will discharge to home with family to assist. has a rolling walker. HHPT referral to [place] [facility] set up with [facility] [place]. discharge instructions discussed by PA. CM spoke with him on the phone. confirmed discharge plan including HHPT. he voiced understanding and has no questions. Also spoke with daughter who will be caregiver. Choice offered.               DME Arranged:    DME Agency:     HH Arranged:  PT HH Agency:  [facility]  Additional Comments: Please contact me with any questions of if this plan should need to change.  Ladell Heads,  [facility] Specialist  [phone] 02/24/2022, 1:42 PM

## 2022-03-02 NOTE — Anesthesia Preprocedure Evaluation (Signed)
Anesthesia Evaluation  Patient identified by MRN, date of birth, ID band Patient awake    Reviewed: Allergy & Precautions, NPO status , Patient's Chart, lab work & pertinent test results, reviewed documented beta blocker date and time   History of Anesthesia Complications Negative for: history of anesthetic complications  Airway Mallampati: II  TM Distance: >3 FB Neck ROM: Full    Dental  (+) Edentulous Upper, Missing,    Pulmonary former smoker   Pulmonary exam normal        Cardiovascular hypertension, Pt. on medications and Pt. on home beta blockers Normal cardiovascular exam+ dysrhythmias (on Eliquis (last dose 02/28/22 PM)) Atrial Fibrillation   TTE 09/2021: EF 65-70%, mild LVH, mild RV dysfunction, moderate pHTN (RVSP 46.71mHg), severe LAE, moderate RAE, mild to moderate TR     Neuro/Psych negative neurological ROS  negative psych ROS   GI/Hepatic negative GI ROS, Neg liver ROS,,,  Endo/Other  negative endocrine ROS    Renal/GU negative Renal ROS  negative genitourinary   Musculoskeletal  (+) Arthritis ,    Abdominal   Peds  Hematology  (+) Blood dyscrasia (Hgb 11.1, Plt 210k), anemia   Anesthesia Other Findings Day of surgery medications reviewed with patient.  Reproductive/Obstetrics negative OB ROS                              Anesthesia Physical Anesthesia Plan  ASA: 3  Anesthesia Plan: General   Post-op Pain Management: Regional block* and Tylenol PO (pre-op)*   Induction: Intravenous  PONV Risk Score and Plan: 3 and Treatment may vary due to age or medical condition, Ondansetron, Propofol infusion, Dexamethasone and TIVA  Airway Management Planned: LMA  Additional Equipment: None  Intra-op Plan:   Post-operative Plan: Extubation in OR  Informed Consent: I have reviewed the patients History and Physical, chart, labs and discussed the procedure including the risks,  benefits and alternatives for the proposed anesthesia with the patient or authorized representative who has indicated his/her understanding and acceptance.     Dental advisory given  Plan Discussed with: CRNA  Anesthesia Plan Comments: (Has not been 72hours since last Eliquis dose, plan for LMA with TIVA. -Daiva Huge MD)         Anesthesia Quick Evaluation

## 2022-03-03 ENCOUNTER — Observation Stay (HOSPITAL_COMMUNITY): Payer: PPO

## 2022-03-03 ENCOUNTER — Encounter (HOSPITAL_COMMUNITY)
Admission: RE | Disposition: A | Discharge status: Home / Self Care | Payer: Self-pay | Source: Ambulatory Visit | Attending: Orthopedic Surgery

## 2022-03-03 ENCOUNTER — Other Ambulatory Visit: Payer: Self-pay

## 2022-03-03 ENCOUNTER — Ambulatory Visit (HOSPITAL_BASED_OUTPATIENT_CLINIC_OR_DEPARTMENT_OTHER): Payer: PPO | Admitting: Anesthesiology

## 2022-03-03 ENCOUNTER — Ambulatory Visit (HOSPITAL_COMMUNITY): Payer: PPO | Admitting: Physician Assistant

## 2022-03-03 ENCOUNTER — Encounter (HOSPITAL_COMMUNITY): Payer: Self-pay | Admitting: Orthopedic Surgery

## 2022-03-03 ENCOUNTER — Observation Stay (HOSPITAL_COMMUNITY)
Admission: RE | Admit: 2022-03-03 | Discharge: 2022-03-04 | Disposition: A | Discharge status: Home / Self Care | Payer: PPO | Source: Ambulatory Visit | Attending: Orthopedic Surgery | Admitting: Orthopedic Surgery

## 2022-03-03 DIAGNOSIS — D649 Anemia, unspecified: Secondary | ICD-10-CM | POA: Diagnosis not present

## 2022-03-03 DIAGNOSIS — I10 Essential (primary) hypertension: Secondary | ICD-10-CM | POA: Diagnosis not present

## 2022-03-03 DIAGNOSIS — Z7901 Long term (current) use of anticoagulants: Secondary | ICD-10-CM | POA: Diagnosis not present

## 2022-03-03 DIAGNOSIS — M1712 Unilateral primary osteoarthritis, left knee: Principal | ICD-10-CM | POA: Insufficient documentation

## 2022-03-03 DIAGNOSIS — I4891 Unspecified atrial fibrillation: Secondary | ICD-10-CM | POA: Diagnosis not present

## 2022-03-03 DIAGNOSIS — Z79899 Other long term (current) drug therapy: Secondary | ICD-10-CM | POA: Diagnosis not present

## 2022-03-03 DIAGNOSIS — G8918 Other acute postprocedural pain: Secondary | ICD-10-CM | POA: Diagnosis not present

## 2022-03-03 DIAGNOSIS — Z87891 Personal history of nicotine dependence: Secondary | ICD-10-CM | POA: Diagnosis not present

## 2022-03-03 DIAGNOSIS — Z471 Aftercare following joint replacement surgery: Secondary | ICD-10-CM | POA: Diagnosis not present

## 2022-03-03 DIAGNOSIS — Z96652 Presence of left artificial knee joint: Secondary | ICD-10-CM | POA: Diagnosis not present

## 2022-03-03 HISTORY — PX: TOTAL KNEE ARTHROPLASTY: SHX125

## 2022-03-03 SURGERY — ARTHROPLASTY, KNEE, TOTAL
Anesthesia: General | Site: Knee | Laterality: Left

## 2022-03-03 MED ORDER — MENTHOL 3 MG MT LOZG: 1.0000 | LOZENGE | OROMUCOSAL | Status: DC | PRN

## 2022-03-03 MED ORDER — FENTANYL CITRATE PF 50 MCG/ML IJ SOSY
25.0000 ug | PREFILLED_SYRINGE | INTRAMUSCULAR | Status: DC | PRN
  Administered 2022-03-03: 50 ug via INTRAVENOUS

## 2022-03-03 MED ORDER — HYDROMORPHONE HCL 1 MG/ML IJ SOLN: 0.5000 mg | INTRAMUSCULAR | Status: DC | PRN

## 2022-03-03 MED ORDER — AMLODIPINE BESYLATE 5 MG PO TABS
2.5000 mg | ORAL_TABLET | Freq: Every day | ORAL | Status: DC
  Administered 2022-03-04: 2.5 mg via ORAL
  Filled 2022-03-03: qty 1

## 2022-03-03 MED ORDER — FENTANYL CITRATE PF 50 MCG/ML IJ SOSY
PREFILLED_SYRINGE | INTRAMUSCULAR | Status: AC
  Filled 2022-03-03: qty 1

## 2022-03-03 MED ORDER — OXYCODONE HCL 5 MG PO TABS: 5.0000 mg | ORAL_TABLET | ORAL | 0 refills | Status: DC | PRN

## 2022-03-03 MED ORDER — ISOPROPYL ALCOHOL 70 % SOLN
Status: DC | PRN
  Administered 2022-03-03: 1 via TOPICAL

## 2022-03-03 MED ORDER — CELECOXIB 200 MG PO CAPS
400.0000 mg | ORAL_CAPSULE | Freq: Once | ORAL | Status: AC
  Administered 2022-03-03: 400 mg via ORAL
  Filled 2022-03-03: qty 2

## 2022-03-03 MED ORDER — DEXAMETHASONE SODIUM PHOSPHATE 10 MG/ML IJ SOLN
INTRAMUSCULAR | Status: AC
  Filled 2022-03-03: qty 1

## 2022-03-03 MED ORDER — KETOROLAC TROMETHAMINE 15 MG/ML IJ SOLN
7.5000 mg | Freq: Four times a day (QID) | INTRAMUSCULAR | Status: DC
  Administered 2022-03-03 – 2022-03-04 (×3): 7.5 mg via INTRAVENOUS
  Filled 2022-03-03 (×3): qty 1

## 2022-03-03 MED ORDER — OXYCODONE HCL 5 MG PO TABS
5.0000 mg | ORAL_TABLET | Freq: Once | ORAL | Status: AC | PRN
  Administered 2022-03-03: 5 mg via ORAL

## 2022-03-03 MED ORDER — PROPOFOL 1000 MG/100ML IV EMUL
INTRAVENOUS | Status: AC
  Filled 2022-03-03: qty 100

## 2022-03-03 MED ORDER — FENTANYL CITRATE (PF) 100 MCG/2ML IJ SOLN
INTRAMUSCULAR | Status: AC
  Filled 2022-03-03: qty 2

## 2022-03-03 MED ORDER — CEFAZOLIN SODIUM-DEXTROSE 2-4 GM/100ML-% IV SOLN
2.0000 g | INTRAVENOUS | Status: AC
  Administered 2022-03-03: 2 g via INTRAVENOUS
  Filled 2022-03-03: qty 100

## 2022-03-03 MED ORDER — SENNOSIDES-DOCUSATE SODIUM 8.6-50 MG PO TABS
1.0000 | ORAL_TABLET | Freq: Every day | ORAL | Status: DC
  Administered 2022-03-04: 1 via ORAL
  Filled 2022-03-03: qty 1

## 2022-03-03 MED ORDER — ACETAMINOPHEN 500 MG PO TABS
1000.0000 mg | ORAL_TABLET | Freq: Four times a day (QID) | ORAL | Status: AC
  Administered 2022-03-03 – 2022-03-04 (×4): 1000 mg via ORAL
  Filled 2022-03-03 (×4): qty 2

## 2022-03-03 MED ORDER — PANTOPRAZOLE SODIUM 40 MG PO TBEC
40.0000 mg | DELAYED_RELEASE_TABLET | Freq: Every day | ORAL | Status: DC
  Administered 2022-03-03 – 2022-03-04 (×2): 40 mg via ORAL
  Filled 2022-03-03 (×2): qty 1

## 2022-03-03 MED ORDER — ONDANSETRON HCL 4 MG/2ML IJ SOLN: 4.0000 mg | Freq: Four times a day (QID) | INTRAMUSCULAR | Status: DC | PRN

## 2022-03-03 MED ORDER — METOPROLOL SUCCINATE ER 25 MG PO TB24
25.0000 mg | ORAL_TABLET | Freq: Every day | ORAL | Status: DC
  Administered 2022-03-04: 25 mg via ORAL
  Filled 2022-03-03: qty 1

## 2022-03-03 MED ORDER — DEXAMETHASONE SODIUM PHOSPHATE 10 MG/ML IJ SOLN
8.0000 mg | Freq: Once | INTRAMUSCULAR | Status: AC
  Administered 2022-03-03: 8 mg via INTRAVENOUS

## 2022-03-03 MED ORDER — ONDANSETRON HCL 4 MG PO TABS: 4.0000 mg | ORAL_TABLET | Freq: Four times a day (QID) | ORAL | Status: DC | PRN

## 2022-03-03 MED ORDER — ACETAMINOPHEN 500 MG PO TABS: 1000.0000 mg | ORAL_TABLET | Freq: Once | ORAL | Status: DC

## 2022-03-03 MED ORDER — FLECAINIDE ACETATE 50 MG PO TABS
50.0000 mg | ORAL_TABLET | Freq: Two times a day (BID) | ORAL | Status: DC
  Administered 2022-03-03 – 2022-03-04 (×2): 50 mg via ORAL
  Filled 2022-03-03 (×2): qty 1

## 2022-03-03 MED ORDER — PHENOL 1.4 % MT LIQD: 1.0000 | OROMUCOSAL | Status: DC | PRN

## 2022-03-03 MED ORDER — TRANEXAMIC ACID-NACL 1000-0.7 MG/100ML-% IV SOLN
1000.0000 mg | INTRAVENOUS | Status: AC
  Administered 2022-03-03: 1000 mg via INTRAVENOUS
  Filled 2022-03-03: qty 100

## 2022-03-03 MED ORDER — CLONIDINE HCL (ANALGESIA) 100 MCG/ML EP SOLN
EPIDURAL | Status: DC | PRN
  Administered 2022-03-03: 50 ug

## 2022-03-03 MED ORDER — FLAXSEED OIL 1000 MG PO CAPS: 1000.0000 mg | ORAL_CAPSULE | Freq: Every morning | ORAL | Status: DC

## 2022-03-03 MED ORDER — WATER FOR IRRIGATION, STERILE IR SOLN
Status: DC | PRN
  Administered 2022-03-03: 2000 mL

## 2022-03-03 MED ORDER — PROPOFOL 500 MG/50ML IV EMUL
INTRAVENOUS | Status: DC | PRN
  Administered 2022-03-03: 130 ug/kg/min via INTRAVENOUS

## 2022-03-03 MED ORDER — LACTATED RINGERS IV SOLN: INTRAVENOUS | Status: DC

## 2022-03-03 MED ORDER — APIXABAN 5 MG PO TABS: 5.0000 mg | ORAL_TABLET | Freq: Two times a day (BID) | ORAL | Status: DC

## 2022-03-03 MED ORDER — APIXABAN 2.5 MG PO TABS: 2.5000 mg | ORAL_TABLET | Freq: Two times a day (BID) | ORAL | Status: DC

## 2022-03-03 MED ORDER — OXYCODONE HCL 5 MG PO TABS
ORAL_TABLET | ORAL | Status: AC
  Filled 2022-03-03: qty 1

## 2022-03-03 MED ORDER — ORAL CARE MOUTH RINSE: 15.0000 mL | Freq: Once | OROMUCOSAL | Status: AC

## 2022-03-03 MED ORDER — APIXABAN 2.5 MG PO TABS
2.5000 mg | ORAL_TABLET | Freq: Two times a day (BID) | ORAL | Status: DC
  Administered 2022-03-04: 2.5 mg via ORAL
  Filled 2022-03-03: qty 1

## 2022-03-03 MED ORDER — SODIUM CHLORIDE 0.9% FLUSH
INTRAVENOUS | Status: DC | PRN
  Administered 2022-03-03: 60 mL

## 2022-03-03 MED ORDER — POLYETHYLENE GLYCOL 3350 17 G PO PACK: 17.0000 g | PACK | Freq: Every day | ORAL | Status: DC | PRN

## 2022-03-03 MED ORDER — PHENYLEPHRINE HCL-NACL 20-0.9 MG/250ML-% IV SOLN
INTRAVENOUS | Status: AC
  Filled 2022-03-03: qty 250

## 2022-03-03 MED ORDER — LIDOCAINE 2% (20 MG/ML) 5 ML SYRINGE
INTRAMUSCULAR | Status: DC | PRN
  Administered 2022-03-03: 80 mg via INTRAVENOUS

## 2022-03-03 MED ORDER — PHENYLEPHRINE HCL-NACL 20-0.9 MG/250ML-% IV SOLN
INTRAVENOUS | Status: DC | PRN
  Administered 2022-03-03: 50 ug/min via INTRAVENOUS

## 2022-03-03 MED ORDER — SODIUM CHLORIDE (PF) 0.9 % IJ SOLN
INTRAMUSCULAR | Status: AC
  Filled 2022-03-03: qty 10

## 2022-03-03 MED ORDER — BUPIVACAINE LIPOSOME 1.3 % IJ SUSP
INTRAMUSCULAR | Status: DC | PRN
  Administered 2022-03-03: 20 mL

## 2022-03-03 MED ORDER — BUPIVACAINE LIPOSOME 1.3 % IJ SUSP
INTRAMUSCULAR | Status: AC
  Filled 2022-03-03: qty 20

## 2022-03-03 MED ORDER — 0.9 % SODIUM CHLORIDE (POUR BTL) OPTIME
TOPICAL | Status: DC | PRN
  Administered 2022-03-03: 1000 mL

## 2022-03-03 MED ORDER — PROPOFOL 10 MG/ML IV BOLUS
INTRAVENOUS | Status: AC
  Filled 2022-03-03: qty 20

## 2022-03-03 MED ORDER — BUPIVACAINE-EPINEPHRINE (PF) 0.5% -1:200000 IJ SOLN
INTRAMUSCULAR | Status: DC | PRN
  Administered 2022-03-03: 15 mL via PERINEURAL

## 2022-03-03 MED ORDER — KETOROLAC TROMETHAMINE 15 MG/ML IJ SOLN
INTRAMUSCULAR | Status: AC
  Filled 2022-03-03: qty 1

## 2022-03-03 MED ORDER — OXYCODONE HCL 5 MG PO TABS
5.0000 mg | ORAL_TABLET | ORAL | Status: DC | PRN
  Administered 2022-03-03 – 2022-03-04 (×3): 10 mg via ORAL
  Filled 2022-03-03 (×3): qty 2

## 2022-03-03 MED ORDER — POVIDONE-IODINE 10 % EX SWAB: 2.0000 | Freq: Once | CUTANEOUS | Status: DC

## 2022-03-03 MED ORDER — SODIUM CHLORIDE (PF) 0.9 % IJ SOLN
INTRAMUSCULAR | Status: AC
  Filled 2022-03-03: qty 50

## 2022-03-03 MED ORDER — PROPOFOL 500 MG/50ML IV EMUL
INTRAVENOUS | Status: AC
  Filled 2022-03-03: qty 50

## 2022-03-03 MED ORDER — FISH OIL 1000 MG PO CAPS: 1000.0000 mg | ORAL_CAPSULE | Freq: Every morning | ORAL | Status: DC

## 2022-03-03 MED ORDER — ONDANSETRON HCL 4 MG/2ML IJ SOLN
INTRAMUSCULAR | Status: DC | PRN
  Administered 2022-03-03: 4 mg via INTRAVENOUS

## 2022-03-03 MED ORDER — ONDANSETRON HCL 4 MG PO TABS: 4.0000 mg | ORAL_TABLET | Freq: Three times a day (TID) | ORAL | 0 refills | Status: DC | PRN

## 2022-03-03 MED ORDER — LOSARTAN POTASSIUM 50 MG PO TABS
50.0000 mg | ORAL_TABLET | Freq: Two times a day (BID) | ORAL | Status: DC
  Administered 2022-03-03 – 2022-03-04 (×2): 50 mg via ORAL
  Filled 2022-03-03 (×2): qty 1

## 2022-03-03 MED ORDER — PROPOFOL 10 MG/ML IV BOLUS
INTRAVENOUS | Status: DC | PRN
  Administered 2022-03-03: 20 mg via INTRAVENOUS
  Administered 2022-03-03: 130 mg via INTRAVENOUS

## 2022-03-03 MED ORDER — BUPIVACAINE LIPOSOME 1.3 % IJ SUSP: 20.0000 mL | Freq: Once | INTRAMUSCULAR | Status: AC

## 2022-03-03 MED ORDER — ACETAMINOPHEN 325 MG PO TABS: 325.0000 mg | ORAL_TABLET | Freq: Four times a day (QID) | ORAL | Status: DC | PRN

## 2022-03-03 MED ORDER — SODIUM CHLORIDE 0.9 % IR SOLN
Status: DC | PRN
  Administered 2022-03-03: 3000 mL

## 2022-03-03 MED ORDER — DOCUSATE SODIUM 100 MG PO CAPS
100.0000 mg | ORAL_CAPSULE | Freq: Two times a day (BID) | ORAL | Status: DC
  Administered 2022-03-03 – 2022-03-04 (×2): 100 mg via ORAL
  Filled 2022-03-03 (×2): qty 1

## 2022-03-03 MED ORDER — DIPHENHYDRAMINE HCL 12.5 MG/5ML PO ELIX: 12.5000 mg | ORAL_SOLUTION | ORAL | Status: DC | PRN

## 2022-03-03 MED ORDER — ACETAMINOPHEN 500 MG PO TABS
1000.0000 mg | ORAL_TABLET | Freq: Once | ORAL | Status: AC
  Administered 2022-03-03: 1000 mg via ORAL
  Filled 2022-03-03: qty 2

## 2022-03-03 MED ORDER — CHLORHEXIDINE GLUCONATE 0.12 % MT SOLN
15.0000 mL | Freq: Once | OROMUCOSAL | Status: AC
  Administered 2022-03-03: 15 mL via OROMUCOSAL

## 2022-03-03 MED ORDER — FENTANYL CITRATE (PF) 100 MCG/2ML IJ SOLN
INTRAMUSCULAR | Status: DC | PRN
  Administered 2022-03-03: 25 ug via INTRAVENOUS
  Administered 2022-03-03: 50 ug via INTRAVENOUS
  Administered 2022-03-03 (×2): 25 ug via INTRAVENOUS

## 2022-03-03 MED ORDER — CEFAZOLIN SODIUM-DEXTROSE 2-4 GM/100ML-% IV SOLN
2.0000 g | Freq: Four times a day (QID) | INTRAVENOUS | Status: AC
  Administered 2022-03-03 (×2): 2 g via INTRAVENOUS
  Filled 2022-03-03 (×2): qty 100

## 2022-03-03 MED ORDER — ONDANSETRON HCL 4 MG/2ML IJ SOLN
INTRAMUSCULAR | Status: AC
  Filled 2022-03-03: qty 2

## 2022-03-03 MED ORDER — OXYCODONE HCL 5 MG/5ML PO SOLN: 5.0000 mg | Freq: Once | ORAL | Status: AC | PRN

## 2022-03-03 SURGICAL SUPPLY — 65 items
ADH SKN CLS LQ APL DERMABOND (GAUZE/BANDAGES/DRESSINGS) ×2
APL PRP STRL LF DISP 70% ISPRP (MISCELLANEOUS) ×2
BAG COUNTER SPONGE SURGICOUNT (BAG) IMPLANT
BAG SPNG CNTER NS LX DISP (BAG)
BLADE SAG 18X100X1.27 (BLADE) ×1 IMPLANT
BLADE SAW SAG 35X64 .89 (BLADE) ×1 IMPLANT
BNDG CMPR 5X3 CHSV STRCH STRL (GAUZE/BANDAGES/DRESSINGS) ×1
BNDG CMPR MED 10X6 ELC LF (GAUZE/BANDAGES/DRESSINGS) ×1
BNDG COHESIVE 3X5 TAN ST LF (GAUZE/BANDAGES/DRESSINGS) ×1 IMPLANT
BNDG ELASTIC 6X10 VLCR STRL LF (GAUZE/BANDAGES/DRESSINGS) ×1 IMPLANT
BOWL SMART MIX CTS (DISPOSABLE) ×1 IMPLANT
BSPLAT TIB 5D G CMNT STM LT (Knees) ×1 IMPLANT
CEMENT BONE R 1X40 (Cement) IMPLANT
CHLORAPREP W/TINT 26 (MISCELLANEOUS) ×2 IMPLANT
COVER SURGICAL LIGHT HANDLE (MISCELLANEOUS) ×1 IMPLANT
CUFF TOURN SGL QUICK 34 (TOURNIQUET CUFF) ×1
CUFF TRNQT CYL 34X4.125X (TOURNIQUET CUFF) ×1 IMPLANT
DERMABOND ADVANCED .7 DNX6 (GAUZE/BANDAGES/DRESSINGS) IMPLANT
DRAPE INCISE IOBAN 85X60 (DRAPES) ×1 IMPLANT
DRAPE SHEET LG 3/4 BI-LAMINATE (DRAPES) ×1 IMPLANT
DRAPE U-SHAPE 47X51 STRL (DRAPES) ×1 IMPLANT
DRSG AQUACEL AG ADV 3.5X10 (GAUZE/BANDAGES/DRESSINGS) IMPLANT
ELECT REM PT RETURN 15FT ADLT (MISCELLANEOUS) ×1 IMPLANT
GAUZE SPONGE 4X4 12PLY STRL (GAUZE/BANDAGES/DRESSINGS) ×1 IMPLANT
GLOVE BIOGEL PI IND STRL 8 (GLOVE) ×1 IMPLANT
GLOVE SURG ORTHO 8.0 STRL STRW (GLOVE) ×2 IMPLANT
GOWN STRL REUS W/ TWL XL LVL3 (GOWN DISPOSABLE) ×1 IMPLANT
GOWN STRL REUS W/TWL XL LVL3 (GOWN DISPOSABLE) ×1
HANDPIECE INTERPULSE COAX TIP (DISPOSABLE) ×1
HDLS TROCR DRIL PIN KNEE 75 (PIN) ×1
HOLDER FOLEY CATH W/STRAP (MISCELLANEOUS) ×1 IMPLANT
HOOD PEEL AWAY T7 (MISCELLANEOUS) ×3 IMPLANT
INSERT FIXED AS PERS SZ8-11 LT (Insert) IMPLANT
MANIFOLD NEPTUNE II (INSTRUMENTS) ×1 IMPLANT
MARKER SKIN DUAL TIP RULER LAB (MISCELLANEOUS) ×1 IMPLANT
NS IRRIG 1000ML POUR BTL (IV SOLUTION) ×1 IMPLANT
PACK TOTAL KNEE CUSTOM (KITS) ×1 IMPLANT
PIN DRILL HDLS TROCAR 75 4PK (PIN) IMPLANT
PROTECTOR NERVE ULNAR (MISCELLANEOUS) ×1 IMPLANT
PSN FEM CR CMT CCR STD SZ10 L (Joint) ×1 IMPLANT
SCREW HEADED 33MM KNEE (MISCELLANEOUS) IMPLANT
SET HNDPC FAN SPRY TIP SCT (DISPOSABLE) ×1 IMPLANT
SLEEVE SCD COMPRESS KNEE MED (STOCKING) IMPLANT
SOLUTION IRRIG SURGIPHOR (IV SOLUTION) IMPLANT
SPIKE FLUID TRANSFER (MISCELLANEOUS) ×1 IMPLANT
STEM POLY PAT PLY 38M KNEE (Knees) IMPLANT
STEM TIB ST PERS 14+30 (Stem) IMPLANT
STEM TIBIA 5 DEG SZ G L KNEE (Knees) IMPLANT
STRIP CLOSURE SKIN 1/2X4 (GAUZE/BANDAGES/DRESSINGS) ×1 IMPLANT
SURFACE ARTC PRSNA CCR SZ10 L (Joint) IMPLANT
SUT MNCRL AB 3-0 PS2 18 (SUTURE) ×1 IMPLANT
SUT MON AB 3-0 SH 27 (SUTURE) ×1
SUT MON AB 3-0 SH27 (SUTURE) IMPLANT
SUT STRATAFIX 0 PDS 27 VIOLET (SUTURE) ×2
SUT STRATAFIX PDO 1 14 VIOLET (SUTURE) ×1
SUT STRATFX PDO 1 14 VIOLET (SUTURE) ×1
SUT VIC AB 2-0 CT2 27 (SUTURE) ×2 IMPLANT
SUTURE STRATFX 0 PDS 27 VIOLET (SUTURE) ×1 IMPLANT
SUTURE STRATFX PDO 1 14 VIOLET (SUTURE) ×1 IMPLANT
SYR 50ML LL SCALE MARK (SYRINGE) ×1 IMPLANT
TIBIA STEM 5 DEG SZ G L KNEE (Knees) ×1 IMPLANT
TRAY FOLEY MTR SLVR 14FR STAT (SET/KITS/TRAYS/PACK) IMPLANT
TUBE SUCTION HIGH CAP CLEAR NV (SUCTIONS) ×1 IMPLANT
UNDERPAD 30X36 HEAVY ABSORB (UNDERPADS AND DIAPERS) ×1 IMPLANT
WRAP KNEE MAXI GEL POST OP (GAUZE/BANDAGES/DRESSINGS) IMPLANT

## 2022-03-03 NOTE — Discharge Instructions (Addendum)
INSTRUCTIONS AFTER JOINT REPLACEMENT   Remove items at home which could result in a fall. This includes throw rugs or furniture in walking pathways ICE to the affected joint every three hours while awake for 30 minutes at a time, for at least the first 3-5 days, and then as needed for pain and swelling.  Continue to use ice for pain and swelling. You may notice swelling that will progress down to the foot and ankle.  This is normal after surgery.  Elevate your leg when you are not up walking on it.   Continue to use the breathing machine you got in the hospital (incentive spirometer) which will help keep your temperature down.  It is common for your temperature to cycle up and down following surgery, especially at night when you are not up moving around and exerting yourself.  The breathing machine keeps your lungs expanded and your temperature down.   DIET:  As you were doing prior to hospitalization, we recommend a well-balanced diet.  DRESSING / WOUND CARE / SHOWERING  Keep the surgical dressing until follow up.  The dressing is water proof, so you can shower without any extra covering.  IF THE DRESSING FALLS OFF or the wound gets wet inside, change the dressing with sterile gauze.  Please use good hand washing techniques before changing the dressing.  Do not use any lotions or creams on the incision until instructed by your surgeon.    ACTIVITY  Increase activity slowly as tolerated, but follow the weight bearing instructions below.   No driving for 6 weeks or until further direction given by your physician.  You cannot drive while taking narcotics.  No lifting or carrying greater than 10 lbs. until further directed by your surgeon. Avoid periods of inactivity such as sitting longer than an hour when not asleep. This helps prevent blood clots.  You may return to work once you are authorized by your doctor.     WEIGHT BEARING   Weight bearing as tolerated with assist device (walker, cane,  etc) as directed, use it as long as suggested by your surgeon or therapist, typically at least 4-6 weeks.   EXERCISES  Results after joint replacement surgery are often greatly improved when you follow the exercise, range of motion and muscle strengthening exercises prescribed by your doctor. Safety measures are also important to protect the joint from further injury. Any time any of these exercises cause you to have increased pain or swelling, decrease what you are doing until you are comfortable again and then slowly increase them. If you have problems or questions, call your caregiver or physical therapist for advice.   Rehabilitation is important following a joint replacement. After just a few days of immobilization, the muscles of the leg can become weakened and shrink (atrophy).  These exercises are designed to build up the tone and strength of the thigh and leg muscles and to improve motion. Often times heat used for twenty to thirty minutes before working out will loosen up your tissues and help with improving the range of motion but do not use heat for the first two weeks following surgery (sometimes heat can increase post-operative swelling).   These exercises can be done on a training (exercise) mat, on the floor, on a table or on a bed. Use whatever works the best and is most comfortable for you.    Use music or television while you are exercising so that the exercises are a pleasant break in your   day. This will make your life better with the exercises acting as a break in your routine that you can look forward to.   Perform all exercises about fifteen times, three times per day or as directed.  You should exercise both the operative leg and the other leg as well.  Exercises include:   Quad Sets - Tighten up the muscle on the front of the thigh (Quad) and hold for 5-10 seconds.   Straight Leg Raises - With your knee straight (if you were given a brace, keep it on), lift the leg to 60  degrees, hold for 3 seconds, and slowly lower the leg.  Perform this exercise against resistance later as your leg gets stronger.  Leg Slides: Lying on your back, slowly slide your foot toward your buttocks, bending your knee up off the floor (only go as far as is comfortable). Then slowly slide your foot back down until your leg is flat on the floor again.  Angel Wings: Lying on your back spread your legs to the side as far apart as you can without causing discomfort.  Hamstring Strength:  Lying on your back, push your heel against the floor with your leg straight by tightening up the muscles of your buttocks.  Repeat, but this time bend your knee to a comfortable angle, and push your heel against the floor.  You may put a pillow under the heel to make it more comfortable if necessary.   A rehabilitation program following joint replacement surgery can speed recovery and prevent re-injury in the future due to weakened muscles. Contact your doctor or a physical therapist for more information on knee rehabilitation.    CONSTIPATION  Constipation is defined medically as fewer than three stools per week and severe constipation as less than one stool per week.  Even if you have a regular bowel pattern at home, your normal regimen is likely to be disrupted due to multiple reasons following surgery.  Combination of anesthesia, postoperative narcotics, change in appetite and fluid intake all can affect your bowels.   YOU MUST use at least one of the following options; they are listed in order of increasing strength to get the job done.  They are all available over the counter, and you may need to use some, POSSIBLY even all of these options:    Drink plenty of fluids (prune juice may be helpful) and high fiber foods Colace 100 mg by mouth twice a day  Senokot for constipation as directed and as needed Dulcolax (bisacodyl), take with full glass of water  Miralax (polyethylene glycol) once or twice a day as  needed.  If you have tried all these things and are unable to have a bowel movement in the first 3-4 days after surgery call either your surgeon or your primary doctor.    If you experience loose stools or diarrhea, hold the medications until you stool forms back up.  If your symptoms do not get better within 1 week or if they get worse, check with your doctor.  If you experience "the worst abdominal pain ever" or develop nausea or vomiting, please contact the office immediately for further recommendations for treatment.   ITCHING:  If you experience itching with your medications, try taking only a single pain pill, or even half a pain pill at a time.  You can also use Benadryl over the counter for itching or also to help with sleep.   TED HOSE STOCKINGS:  Use stockings on both   legs until for at least 2 weeks or as directed by physician office. They may be removed at night for sleeping.  MEDICATIONS:  See your medication summary on the "After Visit Summary" that nursing will review with you.  You may have some home medications which will be placed on hold until you complete the course of blood thinner medication.  It is important for you to complete the blood thinner medication as prescribed.   Blood clot prevention (DVT Prophylaxis): After surgery you are at an increased risk for a blood clot. You will resume eliquis starting day 1 after surgery but only taking 2.'5mg'$  twice daily, then resume full '5mg'$  twice daily dose on day 3 after surgery. This will help prevent a blood clot. Signs of a pulmonary embolus (blood clot in the lungs) include sudden short of breath, feeling lightheaded or dizzy, chest pain with a deep breath, rapid pulse rapid breathing. Signs of a blood clot in your arms or legs include new unexplained swelling and cramping, warm, red or darkened skin around the painful area. Please call the office or 911 right away if these signs or symptoms develop.  PRECAUTIONS:  If you experience  chest pain or shortness of breath - call 911 immediately for transfer to the hospital emergency department.   If you develop a fever greater that 101 F, purulent drainage from wound, increased redness or drainage from wound, foul odor from the wound/dressing, or calf pain - CONTACT YOUR SURGEON.                                                   FOLLOW-UP APPOINTMENTS:  If you do not already have a post-op appointment, please call the office for an appointment to be seen by your surgeon.  Guidelines for how soon to be seen are listed in your "After Visit Summary", but are typically between 2-3 weeks after surgery.  OTHER INSTRUCTIONS:   Knee Replacement:  Do not place pillow under knee, focus on keeping the knee straight while resting.  DO NOT modify, tear, cut, or change the foam block in any way.  POST-OPERATIVE OPIOID TAPER INSTRUCTIONS: It is important to wean off of your opioid medication as soon as possible. If you do not need pain medication after your surgery it is ok to stop day one. Opioids include: Codeine, Hydrocodone(Norco, Vicodin), Oxycodone(Percocet, oxycontin) and hydromorphone amongst others.  Long term and even short term use of opiods can cause: Increased pain response Dependence Constipation Depression Respiratory depression And more.  Withdrawal symptoms can include Flu like symptoms Nausea, vomiting And more Techniques to manage these symptoms Hydrate well Eat regular healthy meals Stay active Use relaxation techniques(deep breathing, meditating, yoga) Do Not substitute Alcohol to help with tapering If you have been on opioids for less than two weeks and do not have pain than it is ok to stop all together.  Plan to wean off of opioids This plan should start within one week post op of your joint replacement. Maintain the same interval or time between taking each dose and first decrease the dose.  Cut the total daily intake of opioids by one tablet each  day Next start to increase the time between doses. The last dose that should be eliminated is the evening dose.   MAKE SURE YOU:  Understand these instructions.  Get help right  away if you are not doing well or get worse.    Thank you for letting us be a part of your medical care team.  It is a privilege we respect greatly.  We hope these instructions will help you stay on track for a fast and full recovery!

## 2022-03-03 NOTE — Transfer of Care (Signed)
Immediate Anesthesia Transfer of Care Note  Patient: Jonathan Woodward  Procedure(s) Performed: TOTAL KNEE ARTHROPLASTY (Left: Knee)  Patient Location: PACU  Anesthesia Type:General  Level of Consciousness: sedated  Airway & Oxygen Therapy: Patient Spontanous Breathing and Patient connected to face mask oxygen  Post-op Assessment: Report given to RN  Post vital signs: Reviewed and stable  Last Vitals:  Vitals Value Taken Time  BP 123/66 03/03/22 0934  Temp    Pulse 69 03/03/22 0935  Resp 10 03/03/22 0935  SpO2 100 % 03/03/22 0935  Vitals shown include unvalidated device data.  Last Pain:  Vitals:   03/03/22 0553  TempSrc:   PainSc: 0-No pain      Patients Stated Pain Goal: 3 (21/82/88 3374)  Complications: No notable events documented.

## 2022-03-03 NOTE — Op Note (Signed)
DATE OF SURGERY:  03/03/2022 TIME: 9:14 AM  PATIENT NAME:  Jonathan Woodward   AGE: 88 y.o.    PRE-OPERATIVE DIAGNOSIS: End-stage left knee osteoarthritis  POST-OPERATIVE DIAGNOSIS:  Same  PROCEDURE: Left total Knee Arthroplasty  SURGEON:  Carine Nordgren A Alexx Mcburney, MD   ASSISTANT: Izola Price, RNFA, present and scrubbed throughout the case, critical for assistance with exposure, retraction, instrumentation, and closure.   OPERATIVE IMPLANTS:  Cemented Zimmer persona size 10 femur standard, G left tibia with 30 mm stem extension, 38 mm patella, 12 mm MC polyethylene liner Implant Name Type Inv. Item Serial No. Manufacturer Lot No. LRB No. Used Action  CEMENT BONE R 1X40 - [id number] Cement CEMENT BONE R 1X40  [employer]MER [employer](ORTH,TRAU,BIO,SG) [id number] Left 2 Implanted  PSN FEM CR CMT CCR STD SZ10 L - [id number] Joint PSN FEM CR CMT CCR STD SZ10 L  [employer](ORTH,TRAU,BIO,SG) [id number] Left 1 Implanted  STEM TIB ST PERS 14+30 - [id number] Stem STEM TIB ST PERS 14+30  ZIMMER [employer]ON(ORTH,TRAU,BIO,SG) [id number] Left 1 Implanted  TIBIA STEM 5 DEG SZ G L KNEE - [id number] Knees TIBIA STEM 5 DEG SZ G L KNEE  ZIMMER [employer]ON(ORTH,TRAU,BIO,SG) [id number] Left 1 Implanted  STEM POLY PAT PLY 6M KNEE - [id number] Knees STEM POLY PAT PLY 6M KNEE  ZIMMER [employer]ON(ORTH,TRAU,BIO,SG) [id number] Left 1 Implanted  INSERT FIXED AS PERS SZ8-11 LT - [id number] Insert INSERT FIXED AS PERS SZ8-11 LT  Z[employer]MER [employer]ON(ORTH,TRAU,BIO,SG) [id number] Left 1 Implanted      PREOPERATIVE INDICATIONS:  [name] is a 88 y.o. year old male with end stage bone on bone degenerative arthritis of the knee who failed conservative treatment, including injections, antiinflammatories, activity modification, and assistive devices, and had significant impairment of their activities of daily living, and elected for Total Knee Arthroplasty.   The risks, benefits, and alternatives were discussed at length including but not limited to  the risks of infection, bleeding, nerve injury, stiffness, blood clots, the need for revision surgery, cardiopulmonary complications, among others, and they were willing to proceed.  OPERATIVE FINDINGS AND UNIQUE ASPECTS OF THE CASE: Severe synovitis, large medial tibial defect, tibia was resected to just shy of the defect. osteoporotic bone elected for stem extension for better tibial fixation  ESTIMATED BLOOD LOSS: 25cc  OPERATIVE DESCRIPTION:   Once adequate anesthesia was induced, preoperative antibiotics, 2 gm of ancef,1 gm of Tranexamic Acid, and 8 mg of Decadron administered, the patient was positioned supine with a left thigh tourniquet placed.  The left lower extremity was prepped and draped in sterile fashion.  A time-  out was performed identifying the patient, planned procedure, and the appropriate extremity.     The leg was  exsanguinated, tourniquet elevated to 250 mmHg.  A midline incision was  made followed by median parapatellar arthrotomy. Anterior horn of the medial meniscus was released and resected. A medial release was performed, the infrapatellar fat pad was resected with care taken to protect the patellar tendon. The suprapatellar fat was removed to exposed the distal anterior femur. The anterior horn of the lateral meniscus and ACL were released.    Following initial  exposure, I first started with the femur  The femoral  canal was opened with a drill, canal was suctioned to try to prevent fat emboli.  An  intramedullary rod was passed set at 5 degrees valgus, 10 mm. The distal femur was resected.  Following this resection, the tibia was  subluxated anteriorly.  Using the extramedullary guide, 10 mm of bone  was resected off   the proximal lateral tibia.  We confirmed the gap would be  stable medially and laterally with a size 35m spacer block as well as confirmed that the tibial cut was perpendicular in the coronal plane, checking with an alignment rod.    Once this was done,  the posterior femoral referencing femoral sizer was placed under to the posterior condyles with 3 degrees of external rotational which was parallel to the transepicondylar axis and perpendicular to [employer] The femur was sized to be a size 10 in the anterior-  posterior dimension. The  anterior, posterior, and  chamfer cuts were made without difficulty nor   notching making certain that I was along the anterior cortex to help  with flexion gap stability. Next a laminar spreader was placed with the knee in flexion and the medial lateral menisci were resected.  5 cc of the Exparel mixture was injected in the medial side of the back of the knee and 3 cc in the lateral side.  1/2 inch curved osteotome was used to resect posterior osteophyte that was then removed with a pituitary rongeur.       At this point, the tibia was sized to be a size G.  The size G tray was  then pinned in position. Trial reduction was now carried with a 10 femur, G tibia, a 10 mm MC insert.  There is notable laxity especially laterally with a 10 mm insert.  Elected to upsized to a 12 mm insert which had better stability.  The knee had full extension and was stable to varus valgus stress in extension.   Attention was next directed to the patella.  Precut  measurement was noted to be 25 mm.  I resected down to 15 mm and used a  38 patellar button to restore patellar height as well as cover the cut surface.     The patella lug holes were drilled and a 38 mm patella poly trial was placed.    The knee was brought to full extension with good flexion stability with the patella tracking through the trochlea without application of pressure.     Next the femoral component was again assessed and determined to be seated and appropriately lateralized.  The femoral lug holes were drilled.  The femoral component was then removed. Tibial component was again assessed and felt to be seated and appropriately rotated with the medial third of  the tubercle. The tibia was then drilled, and keel punched.     Final components were  opened and cement was mixed.      Final implants were then  cemented onto cleaned and dried cut surfaces of bone with the knee brought to extension with a 12 mm MC poly.  The knee was irrigated with sterile Betadine diluted in saline as well as pulse lavage normal saline.  The synovial lining was  then injected a dilute Exparel.      Once the cement had fully cured, excess cement was removed throughout the knee.  I confirmed that I was satisfied with the range of motion and stability, and the final 12 mm MC poly insert was chosen.  It was placed into the knee.         The tourniquet had been let down.  No significant hemostasis was required.  The medial parapatellar arthrotomy was then reapproximated using #1 Stratafix sutures with the knee  in flexion.  The remaining wound was closed with 0 stratafix, 2-0  Vicryl, and running 3-0 Monocryl. The knee was cleaned, dried, dressed sterilely using Dermabond and   Aquacel dressing.  The patient was then brought to recovery room in stable condition, tolerating the procedure  well. There were no complications.   Post op recs: WB: WBAT Abx: ancef Imaging: PACU xrays DVT prophylaxis: Eliquis 2.5 mg postop day 1 and 2, resume Eliquis 5 mg twice daily starting postop day 3 Follow up: 2 weeks after surgery for a wound check with Dr. Zachery Dakins at [facility].  Address: [address], [place], [place] [address]  Office Phone: [phone]  Charlies Constable, MD Orthopaedic Surgery

## 2022-03-03 NOTE — Evaluation (Signed)
Physical Therapy Evaluation Patient Details Name: Jonathan Woodward MRN: [id number] DOB: February 12, 1933 Today's Date: 03/03/2022  History of Present Illness  88 y.o. male admitted for L TKA 03/03/22. PMH: HTN, afib.  Clinical Impression  Pt is s/p TKA resulting in the deficits listed below (see PT Problem List). Pt ambulated 15' with RW, no loss of balance. Initiated TKA HEP. Excellent progress expected, pt is highly motivated.  Pt will benefit from skilled PT to increase their independence and safety with mobility to allow discharge to the venue listed below.         Recommendations for follow up therapy are one component of a multi-disciplinary discharge planning process, led by the attending physician.  Recommendations may be updated based on patient status, additional functional criteria and insurance authorization.  Follow Up Recommendations Follow physician's recommendations for discharge plan and follow up therapies      Assistance Recommended at Discharge Intermittent Supervision/Assistance  Patient can return home with the following  A little help with walking and/or transfers;A little help with bathing/dressing/bathroom;Assistance with cooking/housework;Assist for transportation;Help with stairs or ramp for entrance    Equipment Recommendations None recommended by PT  Recommendations for Other Services       Functional Status Assessment Patient has had a recent decline in their functional status and demonstrates the ability to make significant improvements in function in a reasonable and predictable amount of time.     Precautions / Restrictions Precautions Precautions: Knee;Fall Precaution Comments: reviewed no pillow under knee Restrictions Weight Bearing Restrictions: No      Mobility  Bed Mobility Overal bed mobility: Modified Independent             General bed mobility comments: HOB up    Transfers Overall transfer level: Needs assistance Equipment  used: Rolling walker (2 wheels) Transfers: Sit to/from Stand Sit to Stand: Min assist           General transfer comment: VCs hand placement, min A to power up    Ambulation/Gait Ambulation/Gait assistance: Min guard Gait Distance (Feet): 80 Feet Assistive device: Rolling walker (2 wheels) Gait Pattern/deviations: Step-to pattern, Decreased step length - right, Decreased step length - left Gait velocity: decr     General Gait Details: VCs sequencing  Stairs            Wheelchair Mobility    Modified Rankin (Stroke Patients Only)       Balance Overall balance assessment: Needs assistance, History of Falls   Sitting balance-Leahy Scale: Good     Standing balance support: Bilateral upper extremity supported, During functional activity, Reliant on assistive device for balance Standing balance-Leahy Scale: Poor                               Pertinent Vitals/Pain Pain Assessment Pain Assessment: 0-10 Pain Score: 2  Pain Location: L knee Pain Descriptors / Indicators: Sore, Aching Pain Intervention(s): Limited activity within patient's tolerance, Monitored during session, Premedicated before session, Ice applied    Home Living Family/patient expects to be discharged to:: Private residence Living Arrangements: Spouse/significant other Available Help at Discharge: Family   Home Access: Stairs to enter   Secretary/administrator of Steps: 3   Home Layout: One level Home Equipment: Agricultural consultant (2 wheels);Toilet riser      Prior Function Prior Level of Function : Independent/Modified Independent;History of Falls (last six months)  Mobility Comments: walked with RW, 1 fall in Aug       Hand Dominance        Extremity/Trunk Assessment   Upper Extremity Assessment Upper Extremity Assessment: Overall WFL for tasks assessed    Lower Extremity Assessment Lower Extremity Assessment: RLE deficits/detail RLE Deficits /  Details: SLR 3/5, knee ext 3/5 RLE Sensation: WNL RLE Coordination: WNL    Cervical / Trunk Assessment Cervical / Trunk Assessment: Normal  Communication   Communication: HOH  Cognition Arousal/Alertness: Awake/alert Behavior During Therapy: WFL for tasks assessed/performed Overall Cognitive Status: Within Functional Limits for tasks assessed                                          General Comments      Exercises Total Joint Exercises Ankle Circles/Pumps: AROM, Both, 10 reps, Supine Heel Slides: AAROM, Left, 10 reps, Supine   Assessment/Plan    PT Assessment Patient needs continued PT services  PT Problem List Decreased strength;Decreased range of motion;Decreased activity tolerance;Decreased mobility;Pain       PT Treatment Interventions DME instruction;Gait training;Therapeutic activities;Therapeutic exercise;Functional mobility training;Patient/family education    PT Goals (Current goals can be found in the Care Plan section)  Acute Rehab PT Goals Patient Stated Goal: return to walking PT Goal Formulation: With patient/family Time For Goal Achievement: 03/10/22 Potential to Achieve Goals: Good    Frequency 7X/week     Co-evaluation               AM-PAC PT "6 Clicks" Mobility  Outcome Measure Help needed turning from your back to your side while in a flat bed without using bedrails?: A Little Help needed moving from lying on your back to sitting on the side of a flat bed without using bedrails?: A Little Help needed moving to and from a bed to a chair (including a wheelchair)?: A Little Help needed standing up from a chair using your arms (e.g., wheelchair or bedside chair)?: A Little Help needed to walk in hospital room?: A Little Help needed climbing 3-5 steps with a railing? : A Lot 6 Click Score: 17    End of Session Equipment Utilized During Treatment: Gait belt Activity Tolerance: Patient tolerated treatment well Patient left:  in chair;with chair alarm set;with call bell/phone within reach;with family/visitor present Nurse Communication: Mobility status PT Visit Diagnosis: Difficulty in walking, not elsewhere classified (R26.2);Pain Pain - Right/Left: Left Pain - part of body: Knee    Time: 1355-1420 PT Time Calculation (min) (ACUTE ONLY): 25 min   Charges:   PT Evaluation $PT Eval Moderate Complexity: 1 Mod PT Treatments $Gait Training: 8-22 mins       Ralene Bathe Kistler PT 03/03/2022  Acute Rehabilitation Services  Office [phone]

## 2022-03-03 NOTE — Anesthesia Procedure Notes (Signed)
Anesthesia Regional Block: Adductor canal block   Pre-Anesthetic Checklist: , timeout performed,  Correct Patient, Correct Site, Correct Laterality,  Correct Procedure, Correct Position, site marked,  Risks and benefits discussed,  Pre-op evaluation,  At surgeon's request and post-op pain management  Laterality: Left  Prep: Maximum Sterile Barrier Precautions used, chloraprep       Needles:  Injection technique: Single-shot  Needle Type: Echogenic Stimulator Needle     Needle Length: 9cm  Needle Gauge: 22     Additional Needles:   Procedures:,,,, ultrasound used (permanent image in chart),,    Narrative:  Start time: 03/03/2022 7:03 AM End time: 03/03/2022 7:06 AM Injection made incrementally with aspirations every 5 mL.  Performed by: Personally  Anesthesiologist: Brennan Bailey, MD  Additional Notes: Risks, benefits, and alternative discussed. Patient gave consent for procedure. Patient prepped and draped in sterile fashion. Sedation administered, patient remains easily responsive to voice. Relevant anatomy identified with ultrasound guidance. Local anesthetic given in 5cc increments with no signs or symptoms of intravascular injection. No pain or paraesthesias with injection. Patient monitored throughout procedure with signs of LAST or immediate complications. Tolerated well. Ultrasound image placed in chart.  Tawny Asal, MD

## 2022-03-03 NOTE — Interval H&P Note (Signed)

## 2022-03-03 NOTE — Progress Notes (Signed)
Orthopedic Tech Progress Note Patient Details:  Jonathan Woodward 01/15/1933 [id number]  Patient ID: Jonathan Woodward, male   DOB: 10-Feb-1933, 88 y.o.   MRN: [id number]  Kennis Carina 03/03/2022, 9:48 AM Bone foam applied to left leg in pacu

## 2022-03-03 NOTE — Anesthesia Procedure Notes (Signed)
Procedure Name: LMA Insertion Date/Time: 03/03/2022 7:25 AM  Performed by: Lind Covert, CRNAPre-anesthesia Checklist: Patient identified, Emergency Drugs available, Suction available and Patient being monitored Patient Re-evaluated:Patient Re-evaluated prior to induction Oxygen Delivery Method: Circle system utilized Preoxygenation: Pre-oxygenation with 100% oxygen Induction Type: IV induction LMA: LMA inserted LMA Size: 4.0 Tube type: Oral Number of attempts: 1 Placement Confirmation: positive ETCO2 and breath sounds checked- equal and bilateral Tube secured with: Tape Dental Injury: Teeth and Oropharynx as per pre-operative assessment

## 2022-03-03 NOTE — Anesthesia Postprocedure Evaluation (Signed)
Anesthesia Post Note  Patient: Jonathan Woodward  Procedure(s) Performed: TOTAL KNEE ARTHROPLASTY (Left: Knee)     Patient location during evaluation: PACU Anesthesia Type: General Level of consciousness: awake and alert Pain management: pain level controlled Vital Signs Assessment: post-procedure vital signs reviewed and stable Respiratory status: spontaneous breathing, nonlabored ventilation and respiratory function stable Cardiovascular status: blood pressure returned to baseline Postop Assessment: no apparent nausea or vomiting Anesthetic complications: no   No notable events documented.  Last Vitals:  Vitals:   03/03/22 1015 03/03/22 1030  BP: 131/71 137/76  Pulse: 67 65  Resp: 15 12  Temp:    SpO2: 95% 96%    Last Pain:  Vitals:   03/03/22 1015  TempSrc:   PainSc: [place]

## 2022-03-04 DIAGNOSIS — M1712 Unilateral primary osteoarthritis, left knee: Secondary | ICD-10-CM | POA: Diagnosis not present

## 2022-03-04 LAB — CBC
HCT: 27.6 % — ABNORMAL LOW (ref 39.0–52.0)
Hemoglobin: 8.9 g/dL — ABNORMAL LOW (ref 13.0–17.0)
MCH: 29.2 pg (ref 26.0–34.0)
MCHC: 32.2 g/dL (ref 30.0–36.0)
MCV: 90.5 fL (ref 80.0–100.0)
Platelets: 164 10*3/uL (ref 150–400)
RBC: 3.05 MIL/uL — ABNORMAL LOW (ref 4.22–5.81)
RDW: 14.5 % (ref 11.5–15.5)
WBC: 7.3 10*3/uL (ref 4.0–10.5)
nRBC: 0 % (ref 0.0–0.2)

## 2022-03-04 LAB — BASIC METABOLIC PANEL
Anion gap: 4 — ABNORMAL LOW (ref 5–15)
BUN: 15 mg/dL (ref 8–23)
CO2: 28 mmol/L (ref 22–32)
Calcium: 8.5 mg/dL — ABNORMAL LOW (ref 8.9–10.3)
Chloride: 105 mmol/L (ref 98–111)
Creatinine, Ser: 0.81 mg/dL (ref 0.61–1.24)
GFR, Estimated: >60 mL/min (ref >60)
Glucose, Bld: 128 mg/dL — ABNORMAL HIGH (ref 70–99)
Potassium: 4 mmol/L (ref 3.5–5.1)
Sodium: 137 mmol/L (ref 135–145)

## 2022-03-04 MED ORDER — ONDANSETRON HCL 4 MG PO TABS: 4.0000 mg | ORAL_TABLET | Freq: Three times a day (TID) | ORAL | 0 refills | Status: AC | PRN

## 2022-03-04 MED ORDER — ACETAMINOPHEN 500 MG PO TABS: 1000.0000 mg | ORAL_TABLET | Freq: Three times a day (TID) | ORAL | 0 refills | Status: AC | PRN

## 2022-03-04 MED ORDER — OXYCODONE HCL 5 MG PO TABS: 5.0000 mg | ORAL_TABLET | ORAL | 0 refills | Status: AC | PRN

## 2022-03-04 NOTE — Progress Notes (Signed)
     Subjective:  Patient reports pain as minimal.  Family at bedside.  Already working with therapy this morning and cleared stairs.  Eager to go home today.  Denies distal numbness and tingling.  No concerns.  Objective:   VITALS:   Vitals:   03/03/22 1811 03/03/22 2206 03/04/22 0151 03/04/22 0627  BP: (!) 159/85 (!) 153/89 (!) 150/71 (!) 154/70  Pulse: 65 62 73 65  Resp: _0 Temp: (!) 97.5 F (36.4 C) (!) 97.5 F (36.4 C) 98 F (36.7 C) 97.7 F (36.5 C)  TempSrc: Oral Oral Oral Oral  SpO2: 99% 100% 95% 97%  Weight:      Height:        Sensation intact distally Intact pulses distally Dorsiflexion/Plantar flexion intact Incision: dressing C/D/I Compartment soft   Lab Results  Component Value Date   WBC 7.3 03/04/2022   HGB 8.9 (L) 03/04/2022   HCT 27.6 (L) 03/04/2022   MCV 90.5 03/04/2022   PLT 164 03/04/2022   BMET    Component Value Date/Time   NA 137 03/04/2022 0336   NA 135 12/22/2021 1114   K 4.0 03/04/2022 0336   CL 105 03/04/2022 0336   CO2 28 03/04/2022 0336   GLUCOSE 128 (H) 03/04/2022 0336   BUN 15 03/04/2022 0336   BUN 12 12/22/2021 1114   CREATININE 0.81 03/04/2022 0336   CALCIUM 8.5 (L) 03/04/2022 0336   EGFR 86 12/22/2021 1114   GFRNONAA >60 03/04/2022 0336      Xray: Total knee arthroplasty components in good position no adverse features  Assessment/Plan: 1 Day Post-Op   Principal Problem:   Localized osteoarthritis of left knee  S/p L TKA 11/22  Post op recs: WB: WBAT Abx: ancef Imaging: PACU xrays DVT prophylaxis: Eliquis 2.5 mg postop day 1 and 2, resume Eliquis 5 mg twice daily starting postop day 3 Follow up: 2 weeks after surgery for a wound check with Dr. Zachery Dakins at [facility].  Address: [address] [place], [place], [place] [address]  Office Phone: [phone]   Charlies Constable, MD Orthopaedic Surgery       Nanna Ertle A Tarena Gockley 03/04/2022, 7:58 AM   Charlies Constable,  MD  Contact information:   [phone] 7am-5pm epic message Dr. Zachery Dakins, or call office for patient follow up: [phone] After hours and holidays please check [email or web address] for group call information for [facility]

## 2022-03-04 NOTE — Plan of Care (Signed)

## 2022-03-04 NOTE — Progress Notes (Signed)
Provided discharge education/instructions, all questions and concerns addressed. Pt not in acute distress, discharged home with belongings accompanied by family.

## 2022-03-04 NOTE — Progress Notes (Addendum)
Physical Therapy Treatment Patient Details Name: Jonathan Woodward MRN: [id number] DOB: 01-17-1933 Today's Date: 03/04/2022   History of Present Illness 88 y.o. male admitted for L TKA 03/03/22. PMH: HTN, afib.    PT Comments    Pt is progressing very well with mobility, he ambulated 160' with RW, no loss of balance, no buckling of L knee. Stair training completed. Pt/daughter demonstrate good understanding of TKA HEP, handout of HEP issued. He is ready to DC home from a PT standpoint.     Recommendations for follow up therapy are one component of a multi-disciplinary discharge planning process, led by the attending physician.  Recommendations may be updated based on patient status, additional functional criteria and insurance authorization.  Follow Up Recommendations  Follow physician's recommendations for discharge plan and follow up therapies     Assistance Recommended at Discharge Intermittent Supervision/Assistance  Patient can return home with the following A little help with walking and/or transfers;A little help with bathing/dressing/bathroom;Assistance with cooking/housework;Assist for transportation;Help with stairs or ramp for entrance   Equipment Recommendations  None recommended by PT    Recommendations for Other Services       Precautions / Restrictions Precautions Precautions: Knee;Fall Precaution Comments: reviewed no pillow under knee Restrictions Weight Bearing Restrictions: No     Mobility  Bed Mobility Overal bed mobility: Modified Independent             General bed mobility comments: HOB up    Transfers Overall transfer level: Needs assistance Equipment used: Rolling walker (2 wheels) Transfers: Sit to/from Stand Sit to Stand: Supervision           General transfer comment: VCs hand placement    Ambulation/Gait Ambulation/Gait assistance: Modified independent (Device/Increase time) Gait Distance (Feet): 160 Feet Assistive  device: Rolling walker (2 wheels) Gait Pattern/deviations: Step-to pattern, Decreased step length - right, Decreased step length - left Gait velocity: decr     General Gait Details: steady, no buckling, no LOB   Stairs Stairs: Yes   Stair Management: One rail Left, Forwards, Step to pattern Number of Stairs: 3 General stair comments: VCs sequencing, no loss of balance, daughter present   Wheelchair Mobility    Modified Rankin (Stroke Patients Only)       Balance Overall balance assessment: Needs assistance, History of Falls   Sitting balance-Leahy Scale: Good     Standing balance support: Bilateral upper extremity supported, During functional activity, Reliant on assistive device for balance Standing balance-Leahy Scale: Fair                              Cognition Arousal/Alertness: Awake/alert Behavior During Therapy: WFL for tasks assessed/performed Overall Cognitive Status: Within Functional Limits for tasks assessed                                          Exercises Total Joint Exercises Ankle Circles/Pumps: AROM, Both, 10 reps, Supine Quad Sets: AROM, Left, 5 reps, Supine Short Arc Quad: AROM, Left, 10 reps, Supine Heel Slides: AAROM, Left, 10 reps, Supine Hip ABduction/ADduction: AROM, Left, 10 reps, Supine Straight Leg Raises: AROM, Left, 5 reps, Supine Long Arc Quad: AROM, Left, 10 reps, Seated Knee Flexion: AAROM, Left, 10 reps, Seated Goniometric ROM: 5-95* AAROM L knee    General Comments        Pertinent Vitals/Pain Pain  Assessment Pain Assessment: No/denies pain Pain Score: 0-No pain Pain Location: L knee Pain Descriptors / Indicators: Sore, Aching Pain Intervention(s): Limited activity within patient's tolerance, Monitored during session, Premedicated before session, Ice applied    Home Living                          Prior Function            PT Goals (current goals can now be found in the  care plan section) Acute Rehab PT Goals Patient Stated Goal: return to walking PT Goal Formulation: With patient/family Time For Goal Achievement: 03/10/22 Potential to Achieve Goals: Good Progress towards PT goals: Goals met/education completed, patient discharged from PT    Frequency    7X/week      PT Plan Current plan remains appropriate    Co-evaluation              AM-PAC PT "6 Clicks" Mobility   Outcome Measure  Help needed turning from your back to your side while in a flat bed without using bedrails?: None Help needed moving from lying on your back to sitting on the side of a flat bed without using bedrails?: None Help needed moving to and from a bed to a chair (including a wheelchair)?: None Help needed standing up from a chair using your arms (e.g., wheelchair or bedside chair)?: None Help needed to walk in hospital room?: None Help needed climbing 3-5 steps with a railing? : A Little 6 Click Score: 23    End of Session Equipment Utilized During Treatment: Gait belt Activity Tolerance: Patient tolerated treatment well Patient left: in chair;with chair alarm set;with call bell/phone within reach;with family/visitor present Nurse Communication: Mobility status PT Visit Diagnosis: Difficulty in walking, not elsewhere classified (R26.2);Pain Pain - Right/Left: Left Pain - part of body: Knee     Time: 0718-0752 PT Time Calculation (min) (ACUTE ONLY): 34 min  Charges:  $Gait Training: 8-22 mins $Therapeutic Exercise: 8-22 mins $Therapeutic Activity: 8-22 mins                     Ralene Bathe Kistler PT 03/04/2022  Acute Rehabilitation Services  Office [phone]

## 2022-03-04 NOTE — Discharge Summary (Signed)
Physician Discharge Summary  Patient ID: Jonathan Woodward MRN: [id number] DOB/AGE: Jul 20, 1932 88 y.o.  Admit date: 03/03/2022 Discharge date: 03/04/2022  Admission Diagnoses:  Localized osteoarthritis of left knee  Discharge Diagnoses:  Principal Problem:   Localized osteoarthritis of left knee   Past Medical History:  Diagnosis Date   Arthritis    Atrial fibrillation (HCC)    Glaucoma    HTN (hypertension)     Surgeries: Procedure(s): TOTAL KNEE ARTHROPLASTY on 03/03/2022   Consultants (if any):   Discharged Condition: Improved  Hospital Course: Jonathan Woodward is an 88 y.o. male who was admitted 03/03/2022 with a diagnosis of Localized osteoarthritis of left knee and went to the operating room on 03/03/2022 and underwent the above named procedures.    He was given perioperative antibiotics:  Anti-infectives (From admission, onward)    Start     Dose/Rate Route Frequency Ordered Stop   03/03/22 1400  ceFAZolin (ANCEF) IVPB 2g/100 mL premix        2 g 200 mL/hr over 30 Minutes Intravenous Every 6 hours 03/03/22 1218 03/03/22 2105   03/03/22 0600  ceFAZolin (ANCEF) IVPB 2g/100 mL premix        2 g 200 mL/hr over 30 Minutes Intravenous On call to O.R. 03/03/22 7829 03/03/22 0726     .  He was given sequential compression devices, early ambulation, and eliquis for DVT prophylaxis.  He benefited maximally from the hospital stay and there were no complications.    Recent vital signs:  Vitals:   03/04/22 0151 03/04/22 0627  BP: (!) 150/71 (!) 154/70  Pulse: 73 65  Resp: 17   Temp: 98 F (36.7 C) 97.7 F (36.5 C)  SpO2: 95% 97%    Recent laboratory studies:  Lab Results  Component Value Date   HGB 8.9 (L) 03/04/2022   HGB 11.1 (L) 02/18/2022   HGB 12.0 (L) 12/22/2021   Lab Results  Component Value Date   WBC 7.3 03/04/2022   PLT 164 03/04/2022   No results found for: "INR" Lab Results  Component Value Date   NA 137 03/04/2022   K 4.0  03/04/2022   CL 105 03/04/2022   CO2 28 03/04/2022   BUN 15 03/04/2022   CREATININE 0.81 03/04/2022   GLUCOSE 128 (H) 03/04/2022    Discharge Medications:   Allergies as of 03/04/2022   No Known Allergies      Medication List     TAKE these medications    acetaminophen 500 MG tablet Commonly known as: TYLENOL Take 2 tablets (1,000 mg total) by mouth every 8 (eight) hours as needed.   amLODipine 2.5 MG tablet Commonly known as: NORVASC Take 1 tablet (2.5 mg total) by mouth daily.   apixaban 2.5 MG Tabs tablet Commonly known as: ELIQUIS Take 1 tablet (2.5 mg total) by mouth 2 (two) times daily. Take 2.5mg  twice daily on 11/23 and 11/24. Then resume full 5mg  dose twice daily starting 11/25. What changed:  medication strength how much to take additional instructions   Fish Oil 1000 MG Caps Take 1,000 mg by mouth in the morning.   Flaxseed Oil 1000 MG Caps Take 1,000 mg by mouth in the morning.   flecainide 50 MG tablet Commonly known as: TAMBOCOR Take 1 tablet (50 mg total) by mouth 2 (two) times daily. Please contact the office to schedule appointment for additional refills.   losartan 50 MG tablet Commonly known as: COZAAR Take 50 mg by mouth 2 (two) times  daily.   metoprolol succinate 25 MG 24 hr tablet Commonly known as: TOPROL-XL Take 1 tablet (25 mg total) by mouth daily.   NON FORMULARY Take 1 tablet by mouth in the morning and at bedtime. [facility]( [place])   ondansetron 4 MG tablet Commonly known as: Zofran Take 1 tablet (4 mg total) by mouth every 8 (eight) hours as needed for up to 14 days for nausea or vomiting.   oxyCODONE 5 MG immediate release tablet Commonly known as: Roxicodone Take 1 tablet (5 mg total) by mouth every 4 (four) hours as needed for up to 7 days for severe pain or moderate pain.   sennosides-docusate sodium 8.6-50 MG tablet Commonly known as: SENOKOT-S Take 1 tablet by mouth daily.   TYLENOL PM EXTRA  STRENGTH PO Take 1 tablet by mouth at bedtime as needed (pain/sleep).        Diagnostic Studies: DG Knee Left Port  Result Date: 03/03/2022 CLINICAL DATA:  Status post left total knee arthroplasty. EXAM: PORTABLE LEFT KNEE - 1-2 VIEW COMPARISON:  None Available. FINDINGS: The left femoral and tibial components are well situated. Expected postoperative changes are noted in the soft tissues anteriorly. IMPRESSION: Status post left total knee arthroplasty. Electronically Signed   By: Lupita Raider M.D.   On: 03/03/2022 10:13    Disposition: Discharge disposition: 01-Home or Self Care       Discharge Instructions     Call MD / Call 911   Complete by: As directed    If you experience chest pain or shortness of breath, CALL 911 and be transported to the hospital emergency room.  If you develope a fever above 101 F, pus (white drainage) or increased drainage or redness at the wound, or calf pain, call your surgeon's office.   Constipation Prevention   Complete by: As directed    Drink plenty of fluids.  Prune juice may be helpful.  You may use a stool softener, such as Colace (over the counter) 100 mg twice a day.  Use MiraLax (over the counter) for constipation as needed.   Diet - low sodium heart healthy   Complete by: As directed    Increase activity slowly as tolerated   Complete by: As directed    Post-operative opioid taper instructions:   Complete by: As directed    POST-OPERATIVE OPIOID TAPER INSTRUCTIONS: It is important to wean off of your opioid medication as soon as possible. If you do not need pain medication after your surgery it is ok to stop day one. Opioids include: Codeine, Hydrocodone(Norco, Vicodin), Oxycodone(Percocet, oxycontin) and hydromorphone amongst others.  Long term and even short term use of opiods can cause: Increased pain response Dependence Constipation Depression Respiratory depression And more.  Withdrawal symptoms can include Flu like  symptoms Nausea, vomiting And more Techniques to manage these symptoms Hydrate well Eat regular healthy meals Stay active Use relaxation techniques(deep breathing, meditating, yoga) Do Not substitute Alcohol to help with tapering If you have been on opioids for less than two weeks and do not have pain than it is ok to stop all together.  Plan to wean off of opioids This plan should start within one week post op of your joint replacement. Maintain the same interval or time between taking each dose and first decrease the dose.  Cut the total daily intake of opioids by one tablet each day Next start to increase the time between doses. The last dose that should be eliminated is  the evening dose.           Follow-up Information     Joen Laura, MD. Go on 03/19/2022.   Specialty: Orthopedic Surgery Why: Your appointment is scheduled for 4:30 Contact information: [address] [place] [address] [phone]         Health, [facility] Follow up.   Specialty: Home Health Services Why: HHPT will provide 6 home visits prior to starting outpatient physical therapy Contact information: [address] [place] [address] [phone]         Therapy, [facility]. Go on 03/22/2022.   Specialty: Physical Therapy Why: Your appointment is scheduled for 10:00. Contact information: [address] [place] [address] [phone]                    Discharge Instructions      INSTRUCTIONS AFTER JOINT REPLACEMENT   Remove items at home which could result in a fall. This includes throw rugs or furniture in walking pathways ICE to the affected joint every three hours while awake for 30 minutes at a time, for at least the first 3-5 days, and then as needed for pain and swelling.  Continue to use ice for pain and swelling. You may notice swelling that will progress down to the foot and ankle.  This is  normal after surgery.  Elevate your leg when you are not up walking on it.   Continue to use the breathing machine you got in the hospital (incentive spirometer) which will help keep your temperature down.  It is common for your temperature to cycle up and down following surgery, especially at night when you are not up moving around and exerting yourself.  The breathing machine keeps your lungs expanded and your temperature down.   DIET:  As you were doing prior to hospitalization, we recommend a well-balanced diet.  DRESSING / WOUND CARE / SHOWERING  Keep the surgical dressing until follow up.  The dressing is water proof, so you can shower without any extra covering.  IF THE DRESSING FALLS OFF or the wound gets wet inside, change the dressing with sterile gauze.  Please use good hand washing techniques before changing the dressing.  Do not use any lotions or creams on the incision until instructed by your surgeon.    ACTIVITY  Increase activity slowly as tolerated, but follow the weight bearing instructions below.   No driving for 6 weeks or until further direction given by your physician.  You cannot drive while taking narcotics.  No lifting or carrying greater than 10 lbs. until further directed by your surgeon. Avoid periods of inactivity such as sitting longer than an hour when not asleep. This helps prevent blood clots.  You may return to work once you are authorized by your doctor.     WEIGHT BEARING   Weight bearing as tolerated with assist device (walker, cane, etc) as directed, use it as long as suggested by your surgeon or therapist, typically at least 4-6 weeks.   EXERCISES  Results after joint replacement surgery are often greatly improved when you follow the exercise, range of motion and muscle strengthening exercises prescribed by your doctor. Safety measures are also important to protect the joint from further injury. Any time any of these exercises cause you to have  increased pain or swelling, decrease what you are doing until you are comfortable again and then slowly increase them.  If you have problems or questions, call your caregiver or physical therapist for advice.   Rehabilitation is important following a joint replacement. After just a few days of immobilization, the muscles of the leg can become weakened and shrink (atrophy).  These exercises are designed to build up the tone and strength of the thigh and leg muscles and to improve motion. Often times heat used for twenty to thirty minutes before working out will loosen up your tissues and help with improving the range of motion but do not use heat for the first two weeks following surgery (sometimes heat can increase post-operative swelling).   These exercises can be done on a training (exercise) mat, on the floor, on a table or on a bed. Use whatever works the best and is most comfortable for you.    Use music or television while you are exercising so that the exercises are a pleasant break in your day. This will make your life better with the exercises acting as a break in your routine that you can look forward to.   Perform all exercises about fifteen times, three times per day or as directed.  You should exercise both the operative leg and the other leg as well.  Exercises include:   Quad Sets - Tighten up the muscle on the front of the thigh (Quad) and hold for 5-10 seconds.   Straight Leg Raises - With your knee straight (if you were given a brace, keep it on), lift the leg to 60 degrees, hold for 3 seconds, and slowly lower the leg.  Perform this exercise against resistance later as your leg gets stronger.  Leg Slides: Lying on your back, slowly slide your foot toward your buttocks, bending your knee up off the floor (only go as far as is comfortable). Then slowly slide your foot back down until your leg is flat on the floor again.  Angel Wings: Lying on your back spread your legs to the side as far  apart as you can without causing discomfort.  Hamstring Strength:  Lying on your back, push your heel against the floor with your leg straight by tightening up the muscles of your buttocks.  Repeat, but this time bend your knee to a comfortable angle, and push your heel against the floor.  You may put a pillow under the heel to make it more comfortable if necessary.   A rehabilitation program following joint replacement surgery can speed recovery and prevent re-injury in the future due to weakened muscles. Contact your doctor or a physical therapist for more information on knee rehabilitation.    CONSTIPATION  Constipation is defined medically as fewer than three stools per week and severe constipation as less than one stool per week.  Even if you have a regular bowel pattern at home, your normal regimen is likely to be disrupted due to multiple reasons following surgery.  Combination of anesthesia, postoperative narcotics, change in appetite and fluid intake all can affect your bowels.   YOU MUST use at least one of the following options; they are listed in order of increasing strength to get the job done.  They are all available over the counter, and you may need to use some, POSSIBLY even all of these options:    Drink plenty of fluids (prune juice may be helpful) and high fiber foods Colace 100 mg by mouth twice a day  Senokot for constipation as directed and as needed Dulcolax (bisacodyl), take with full glass of water  Miralax (polyethylene glycol) once or twice a day as needed.  If you have tried all these things and are unable to have a bowel movement in the first 3-4 days after surgery call either your surgeon or your primary doctor.    If you experience loose stools or diarrhea, hold the medications until you stool forms back up.  If your symptoms do not get better within 1 week or if they get worse, check with your doctor.  If you experience "the worst abdominal pain ever" or develop  nausea or vomiting, please contact the office immediately for further recommendations for treatment.   ITCHING:  If you experience itching with your medications, try taking only a single pain pill, or even half a pain pill at a time.  You can also use Benadryl over the counter for itching or also to help with sleep.   TED HOSE STOCKINGS:  Use stockings on both legs until for at least 2 weeks or as directed by physician office. They may be removed at night for sleeping.  MEDICATIONS:  See your medication summary on the "After Visit Summary" that nursing will review with you.  You may have some home medications which will be placed on hold until you complete the course of blood thinner medication.  It is important for you to complete the blood thinner medication as prescribed.   Blood clot prevention (DVT Prophylaxis): After surgery you are at an increased risk for a blood clot. You will resume eliquis starting day 1 after surgery but only taking 2.5mg  twice daily, then resume full 5mg  twice daily dose on day 3 after surgery. This will help prevent a blood clot. Signs of a pulmonary embolus (blood clot in the lungs) include sudden short of breath, feeling lightheaded or dizzy, chest pain with a deep breath, rapid pulse rapid breathing. Signs of a blood clot in your arms or legs include new unexplained swelling and cramping, warm, red or darkened skin around the painful area. Please call the office or 911 right away if these signs or symptoms develop.  PRECAUTIONS:  If you experience chest pain or shortness of breath - call 911 immediately for transfer to the hospital emergency department.   If you develop a fever greater that 101 F, purulent drainage from wound, increased redness or drainage from wound, foul odor from the wound/dressing, or calf pain - CONTACT YOUR SURGEON.                                                   FOLLOW-UP APPOINTMENTS:  If you do not already have a post-op appointment,  please call the office for an appointment to be seen by your surgeon.  Guidelines for how soon to be seen are listed in your "After Visit Summary", but are typically between 2-3 weeks after surgery.  OTHER INSTRUCTIONS:   Knee Replacement:  Do not place pillow under knee, focus on keeping the knee straight while resting.  DO NOT modify, tear, cut, or change the foam block in any way.  POST-OPERATIVE OPIOID TAPER INSTRUCTIONS: It is important to wean off of your opioid medication as soon as possible. If you do not need pain medication after your surgery it is ok to stop day one. Opioids include: Codeine, Hydrocodone(Norco, Vicodin), Oxycodone(Percocet, oxycontin) and hydromorphone amongst others.  Long term and even short term  use of opiods can cause: Increased pain response Dependence Constipation Depression Respiratory depression And more.  Withdrawal symptoms can include Flu like symptoms Nausea, vomiting And more Techniques to manage these symptoms Hydrate well Eat regular healthy meals Stay active Use relaxation techniques(deep breathing, meditating, yoga) Do Not substitute Alcohol to help with tapering If you have been on opioids for less than two weeks and do not have pain than it is ok to stop all together.  Plan to wean off of opioids This plan should start within one week post op of your joint replacement. Maintain the same interval or time between taking each dose and first decrease the dose.  Cut the total daily intake of opioids by one tablet each day Next start to increase the time between doses. The last dose that should be eliminated is the evening dose.   MAKE SURE YOU:  Understand these instructions.  Get help right away if you are not doing well or get worse.    Thank you for letting us be a part of your medical care team.  It is a privilege we respect greatly.  We hope these instructions will help you stay on track for a fast and full recovery!            Signed: Brennon Otterness A Jensine Luz 03/04/2022, 7:59 AM

## 2022-03-05 DIAGNOSIS — Z9841 Cataract extraction status, right eye: Secondary | ICD-10-CM | POA: Diagnosis not present

## 2022-03-05 DIAGNOSIS — I4819 Other persistent atrial fibrillation: Secondary | ICD-10-CM | POA: Diagnosis not present

## 2022-03-05 DIAGNOSIS — Z7901 Long term (current) use of anticoagulants: Secondary | ICD-10-CM | POA: Diagnosis not present

## 2022-03-05 DIAGNOSIS — F32A Depression, unspecified: Secondary | ICD-10-CM | POA: Diagnosis not present

## 2022-03-05 DIAGNOSIS — Z9842 Cataract extraction status, left eye: Secondary | ICD-10-CM | POA: Diagnosis not present

## 2022-03-05 DIAGNOSIS — Z87891 Personal history of nicotine dependence: Secondary | ICD-10-CM | POA: Diagnosis not present

## 2022-03-05 DIAGNOSIS — I10 Essential (primary) hypertension: Secondary | ICD-10-CM | POA: Diagnosis not present

## 2022-03-05 DIAGNOSIS — K5900 Constipation, unspecified: Secondary | ICD-10-CM | POA: Diagnosis not present

## 2022-03-05 DIAGNOSIS — Z471 Aftercare following joint replacement surgery: Secondary | ICD-10-CM | POA: Diagnosis not present

## 2022-03-05 DIAGNOSIS — E785 Hyperlipidemia, unspecified: Secondary | ICD-10-CM | POA: Diagnosis not present

## 2022-03-05 DIAGNOSIS — Z96652 Presence of left artificial knee joint: Secondary | ICD-10-CM | POA: Diagnosis not present

## 2022-03-05 DIAGNOSIS — Z9181 History of falling: Secondary | ICD-10-CM | POA: Diagnosis not present

## 2022-03-05 DIAGNOSIS — H409 Unspecified glaucoma: Secondary | ICD-10-CM | POA: Diagnosis not present

## 2022-03-08 ENCOUNTER — Encounter (HOSPITAL_COMMUNITY): Payer: Self-pay | Admitting: Orthopedic Surgery

## 2022-03-12 DIAGNOSIS — Z96652 Presence of left artificial knee joint: Secondary | ICD-10-CM | POA: Diagnosis not present

## 2022-03-12 DIAGNOSIS — I10 Essential (primary) hypertension: Secondary | ICD-10-CM | POA: Diagnosis not present

## 2022-03-12 DIAGNOSIS — Z87891 Personal history of nicotine dependence: Secondary | ICD-10-CM | POA: Diagnosis not present

## 2022-03-12 DIAGNOSIS — K5900 Constipation, unspecified: Secondary | ICD-10-CM | POA: Diagnosis not present

## 2022-03-12 DIAGNOSIS — Z7901 Long term (current) use of anticoagulants: Secondary | ICD-10-CM | POA: Diagnosis not present

## 2022-03-12 DIAGNOSIS — E785 Hyperlipidemia, unspecified: Secondary | ICD-10-CM | POA: Diagnosis not present

## 2022-03-12 DIAGNOSIS — Z9841 Cataract extraction status, right eye: Secondary | ICD-10-CM | POA: Diagnosis not present

## 2022-03-12 DIAGNOSIS — Z9181 History of falling: Secondary | ICD-10-CM | POA: Diagnosis not present

## 2022-03-12 DIAGNOSIS — Z9842 Cataract extraction status, left eye: Secondary | ICD-10-CM | POA: Diagnosis not present

## 2022-03-12 DIAGNOSIS — F32A Depression, unspecified: Secondary | ICD-10-CM | POA: Diagnosis not present

## 2022-03-12 DIAGNOSIS — H409 Unspecified glaucoma: Secondary | ICD-10-CM | POA: Diagnosis not present

## 2022-03-12 DIAGNOSIS — Z471 Aftercare following joint replacement surgery: Secondary | ICD-10-CM | POA: Diagnosis not present

## 2022-03-12 DIAGNOSIS — I4819 Other persistent atrial fibrillation: Secondary | ICD-10-CM | POA: Diagnosis not present

## 2022-03-15 DIAGNOSIS — E785 Hyperlipidemia, unspecified: Secondary | ICD-10-CM | POA: Diagnosis not present

## 2022-03-15 DIAGNOSIS — I4819 Other persistent atrial fibrillation: Secondary | ICD-10-CM | POA: Diagnosis not present

## 2022-03-15 DIAGNOSIS — Z7901 Long term (current) use of anticoagulants: Secondary | ICD-10-CM | POA: Diagnosis not present

## 2022-03-15 DIAGNOSIS — I10 Essential (primary) hypertension: Secondary | ICD-10-CM | POA: Diagnosis not present

## 2022-03-15 DIAGNOSIS — H409 Unspecified glaucoma: Secondary | ICD-10-CM | POA: Diagnosis not present

## 2022-03-15 DIAGNOSIS — M1712 Unilateral primary osteoarthritis, left knee: Secondary | ICD-10-CM | POA: Diagnosis not present

## 2022-03-15 DIAGNOSIS — Z9181 History of falling: Secondary | ICD-10-CM | POA: Diagnosis not present

## 2022-03-15 DIAGNOSIS — Z87891 Personal history of nicotine dependence: Secondary | ICD-10-CM | POA: Diagnosis not present

## 2022-03-15 DIAGNOSIS — Z471 Aftercare following joint replacement surgery: Secondary | ICD-10-CM | POA: Diagnosis not present

## 2022-03-15 DIAGNOSIS — Z96652 Presence of left artificial knee joint: Secondary | ICD-10-CM | POA: Diagnosis not present

## 2022-03-15 DIAGNOSIS — K5900 Constipation, unspecified: Secondary | ICD-10-CM | POA: Diagnosis not present

## 2022-03-15 DIAGNOSIS — Z9842 Cataract extraction status, left eye: Secondary | ICD-10-CM | POA: Diagnosis not present

## 2022-03-15 DIAGNOSIS — F32A Depression, unspecified: Secondary | ICD-10-CM | POA: Diagnosis not present

## 2022-03-15 DIAGNOSIS — Z9841 Cataract extraction status, right eye: Secondary | ICD-10-CM | POA: Diagnosis not present

## 2022-03-17 DIAGNOSIS — H409 Unspecified glaucoma: Secondary | ICD-10-CM | POA: Diagnosis not present

## 2022-03-17 DIAGNOSIS — Z471 Aftercare following joint replacement surgery: Secondary | ICD-10-CM | POA: Diagnosis not present

## 2022-03-17 DIAGNOSIS — E785 Hyperlipidemia, unspecified: Secondary | ICD-10-CM | POA: Diagnosis not present

## 2022-03-17 DIAGNOSIS — Z96652 Presence of left artificial knee joint: Secondary | ICD-10-CM | POA: Diagnosis not present

## 2022-03-17 DIAGNOSIS — Z7901 Long term (current) use of anticoagulants: Secondary | ICD-10-CM | POA: Diagnosis not present

## 2022-03-17 DIAGNOSIS — I4819 Other persistent atrial fibrillation: Secondary | ICD-10-CM | POA: Diagnosis not present

## 2022-03-17 DIAGNOSIS — I10 Essential (primary) hypertension: Secondary | ICD-10-CM | POA: Diagnosis not present

## 2022-03-17 DIAGNOSIS — K5900 Constipation, unspecified: Secondary | ICD-10-CM | POA: Diagnosis not present

## 2022-03-17 DIAGNOSIS — F32A Depression, unspecified: Secondary | ICD-10-CM | POA: Diagnosis not present

## 2022-03-17 DIAGNOSIS — Z9181 History of falling: Secondary | ICD-10-CM | POA: Diagnosis not present

## 2022-03-17 DIAGNOSIS — Z9841 Cataract extraction status, right eye: Secondary | ICD-10-CM | POA: Diagnosis not present

## 2022-03-17 DIAGNOSIS — Z87891 Personal history of nicotine dependence: Secondary | ICD-10-CM | POA: Diagnosis not present

## 2022-03-17 DIAGNOSIS — Z9842 Cataract extraction status, left eye: Secondary | ICD-10-CM | POA: Diagnosis not present

## 2022-03-18 DIAGNOSIS — I4891 Unspecified atrial fibrillation: Secondary | ICD-10-CM | POA: Diagnosis not present

## 2022-03-18 DIAGNOSIS — E663 Overweight: Secondary | ICD-10-CM | POA: Diagnosis not present

## 2022-03-18 DIAGNOSIS — D6869 Other thrombophilia: Secondary | ICD-10-CM | POA: Diagnosis not present

## 2022-03-18 DIAGNOSIS — K5900 Constipation, unspecified: Secondary | ICD-10-CM | POA: Diagnosis not present

## 2022-03-18 DIAGNOSIS — M1712 Unilateral primary osteoarthritis, left knee: Secondary | ICD-10-CM | POA: Diagnosis not present

## 2022-03-18 DIAGNOSIS — I10 Essential (primary) hypertension: Secondary | ICD-10-CM | POA: Diagnosis not present

## 2022-03-22 DIAGNOSIS — M25562 Pain in left knee: Secondary | ICD-10-CM | POA: Diagnosis not present

## 2022-03-24 DIAGNOSIS — M25562 Pain in left knee: Secondary | ICD-10-CM | POA: Diagnosis not present

## 2022-03-29 DIAGNOSIS — M25562 Pain in left knee: Secondary | ICD-10-CM | POA: Diagnosis not present

## 2022-04-01 DIAGNOSIS — M25562 Pain in left knee: Secondary | ICD-10-CM | POA: Diagnosis not present

## 2022-04-08 DIAGNOSIS — M25562 Pain in left knee: Secondary | ICD-10-CM | POA: Diagnosis not present

## 2022-04-14 DIAGNOSIS — M25562 Pain in left knee: Secondary | ICD-10-CM | POA: Diagnosis not present

## 2022-04-15 ENCOUNTER — Telehealth: Payer: Self-pay | Admitting: *Deleted

## 2022-04-15 DIAGNOSIS — M170 Bilateral primary osteoarthritis of knee: Secondary | ICD-10-CM | POA: Diagnosis not present

## 2022-04-15 NOTE — Telephone Encounter (Signed)
Patient Name: Jonathan Woodward  DOB: June 16, 1932 MRN: [id number]  Primary Cardiologist: Thurmon Fair, MD  Chart reviewed as part of pre-operative protocol coverage.   -Pharmacy team please provide guidance on holding Eliquis for right total knee replacement.  Thank you,   Napoleon Form, Leodis Rains, NP 04/15/2022, 1:47 PM

## 2022-04-15 NOTE — Telephone Encounter (Signed)
Patient with diagnosis of afib on Eliquis for anticoagulation.    Procedure: right total knee replacement Date of procedure: TBD  CHA2DS2-VASc Score = 3  This indicates a 3.2% annual risk of stroke. The patient's score is based upon: CHF History: 0 HTN History: 1 Diabetes History: 0 Stroke History: 0 Vascular Disease History: 0 Age Score: 2 Gender Score: 0  DCCV Sept 2023.  CrCl 17m/min Platelet count 164K  Per office protocol, patient can hold Eliquis for 3 days prior to procedure.    **This guidance is not considered finalized until pre-operative APP has relayed final recommendations.**

## 2022-04-15 NOTE — Telephone Encounter (Signed)
Pre-operative Risk Assessment    Patient Name: Jonathan Woodward  DOB: [birth year] MRN: [id number]      Request for Surgical Clearance    Procedure:   RIGHT TOTAL KNEE REPLACEMENT  Date of Surgery:  Clearance TBD                                 Surgeon:  DR. DANIEL MARCHWIANY Surgeon's Group or Practice Name:  [facility] Phone number:  [phone] EXT [phone] ATTN: [facility] Fax number:  [phone]   Type of Clearance Requested:   - Medical  - Pharmacy:  Hold Apixaban (Eliquis)     Type of Anesthesia:  Spinal   Additional requests/questions:    [name]   04/15/2022, 1:30 PM

## 2022-04-15 NOTE — Addendum Note (Signed)
Addended by: Mariangel Ringley E on: 04/15/2022 02:08 PM   Modules accepted: Orders

## 2022-04-15 NOTE — Telephone Encounter (Signed)
Name: Jonathan Woodward  DOB: 03/10/1933  MRN: [id number]  Primary Cardiologist: Thurmon Fair, MD   Preoperative team, please contact this patient and set up a phone call appointment for further preoperative risk assessment. Please obtain consent and complete medication review. Thank you for your help.  I confirm that guidance regarding antiplatelet and oral anticoagulation therapy has been completed and, if necessary, noted below.  Per office protocol, patient can hold Eliquis for 3 days prior to procedure.    Napoleon Form, Leodis Rains, NP 04/15/2022, 2:34 PM [facility]

## 2022-04-16 ENCOUNTER — Telehealth: Payer: Self-pay | Admitting: *Deleted

## 2022-04-16 NOTE — Telephone Encounter (Signed)
Pt has been scheduled for tele visit 04/22/22 @ 9 am. Med rec and consent are done.

## 2022-04-16 NOTE — Telephone Encounter (Signed)
Pt has been scheduled for tele visit 04/22/22 @ 9 am. Med rec and consent are done.     Patient Consent for Virtual Visit        Jonathan Woodward has provided verbal consent on 04/16/2022 for a virtual visit (video or telephone).   CONSENT FOR VIRTUAL VISIT FOR:  Jonathan Woodward  By participating in this virtual visit I agree to the following:  I hereby voluntarily request, consent and authorize [facility] and its employed or contracted physicians, physician assistants, nurse practitioners or other licensed health care professionals (the Practitioner), to provide me with telemedicine health care services (the "Services") as deemed necessary by the treating Practitioner. I acknowledge and consent to receive the Services by the Practitioner via telemedicine. I understand that the telemedicine visit will involve communicating with the Practitioner through live audiovisual communication technology and the disclosure of certain medical information by electronic transmission. I acknowledge that I have been given the opportunity to request an in-person assessment or other available alternative prior to the telemedicine visit and am voluntarily participating in the telemedicine visit.  I understand that I have the right to withhold or withdraw my consent to the use of telemedicine in the course of my care at any time, without affecting my right to future care or treatment, and that the Practitioner or I may terminate the telemedicine visit at any time. I understand that I have the right to inspect all information obtained and/or recorded in the course of the telemedicine visit and may receive copies of available information for a reasonable fee.  I understand that some of the potential risks of receiving the Services via telemedicine include:  Delay or interruption in medical evaluation due to technological equipment failure or disruption; Information transmitted may not be sufficient  (e.g. poor resolution of images) to allow for appropriate medical decision making by the Practitioner; and/or  In rare instances, security protocols could fail, causing a breach of personal health information.  Furthermore, I acknowledge that it is my responsibility to provide information about my medical history, conditions and care that is complete and accurate to the best of my ability. I acknowledge that Practitioner's advice, recommendations, and/or decision may be based on factors not within their control, such as incomplete or inaccurate data provided by me or distortions of diagnostic images or specimens that may result from electronic transmissions. I understand that the practice of medicine is not an exact science and that Practitioner makes no warranties or guarantees regarding treatment outcomes. I acknowledge that a copy of this consent can be made available to me via my patient portal ([facility]), or I can request a printed copy by calling the office of [facility].    I understand that my insurance will be billed for this visit.   I have read or had this consent read to me. I understand the contents of this consent, which adequately explains the benefits and risks of the Services being provided via telemedicine.  I have been provided ample opportunity to ask questions regarding this consent and the Services and have had my questions answered to my satisfaction. I give my informed consent for the services to be provided through the use of telemedicine in my medical care

## 2022-04-19 DIAGNOSIS — M1991 Primary osteoarthritis, unspecified site: Secondary | ICD-10-CM | POA: Diagnosis not present

## 2022-04-19 DIAGNOSIS — R768 Other specified abnormal immunological findings in serum: Secondary | ICD-10-CM | POA: Diagnosis not present

## 2022-04-19 DIAGNOSIS — R700 Elevated erythrocyte sedimentation rate: Secondary | ICD-10-CM | POA: Diagnosis not present

## 2022-04-19 DIAGNOSIS — E663 Overweight: Secondary | ICD-10-CM | POA: Diagnosis not present

## 2022-04-19 DIAGNOSIS — M79641 Pain in right hand: Secondary | ICD-10-CM | POA: Diagnosis not present

## 2022-04-19 DIAGNOSIS — M2560 Stiffness of unspecified joint, not elsewhere classified: Secondary | ICD-10-CM | POA: Diagnosis not present

## 2022-04-19 DIAGNOSIS — Z6828 Body mass index (BMI) 28.0-28.9, adult: Secondary | ICD-10-CM | POA: Diagnosis not present

## 2022-04-19 DIAGNOSIS — M25521 Pain in right elbow: Secondary | ICD-10-CM | POA: Diagnosis not present

## 2022-04-19 DIAGNOSIS — M2540 Effusion, unspecified joint: Secondary | ICD-10-CM | POA: Diagnosis not present

## 2022-04-19 DIAGNOSIS — M79642 Pain in left hand: Secondary | ICD-10-CM | POA: Diagnosis not present

## 2022-04-19 DIAGNOSIS — R7982 Elevated C-reactive protein (CRP): Secondary | ICD-10-CM | POA: Diagnosis not present

## 2022-04-19 DIAGNOSIS — R5383 Other fatigue: Secondary | ICD-10-CM | POA: Diagnosis not present

## 2022-04-21 NOTE — Progress Notes (Unsigned)
Virtual Visit via Telephone Note   Because of Jonathan Woodward Study's co-morbid illnesses, he is at least at moderate risk for complications without adequate follow up.  This format is felt to be most appropriate for this patient at this time.  The patient did not have access to video technology/had technical difficulties with video requiring transitioning to audio format only (telephone).  All issues noted in this document were discussed and addressed.  No physical exam could be performed with this format.  Please refer to the patient's chart for his consent to telehealth for [name].  Evaluation Performed:  Preoperative cardiovascular risk assessment _____________   Date:  04/21/2022   Patient ID:  Jonathan Woodward, DOB 23-Nov-1932, MRN [id number] Patient Location:  Home Provider location:   Office  Primary Care Provider:  Lonie Peak, PA-C Primary Cardiologist:  Thurmon Fair, MD  Chief Complaint / Patient Profile   89 y.o. y/o male with a h/o mild hypertension, PAF on chronic anticoagulation, normal nuclear stress test and placed on flecainide with successful repeat cardioversion 12/29/2021, mildly reduced RV function on echo, RBBB, and OA/RA who is pending right total knee replacement and presents today for telephonic preoperative cardiovascular risk assessment.  History of Present Illness    Jonathan Woodward is a 89 y.o. male who presents via audio/video conferencing for a telehealth visit today.  Pt was last seen in cardiology clinic on 01/13/22 by Dr. Royann Shivers.  At that time Jonathan Woodward was doing well.  The patient is now pending procedure as outlined above. Since his last visit, he denies chest pain, shortness of breath, lower extremity edema, fatigue, palpitations, melena, hematuria, hemoptysis, diaphoresis, weakness, presyncope, syncope, orthopnea, and PND. He remains active and does not have any specific cardiac concerns.    Past Medical History     Past Medical History:  Diagnosis Date   Arthritis    Atrial fibrillation (HCC)    Glaucoma    HTN (hypertension)    Past Surgical History:  Procedure Laterality Date   CARDIOVERSION N/A 10/16/2021   Procedure: CARDIOVERSION;  Surgeon: Chrystie Nose, MD;  Location: [facility] ENDOSCOPY;  Service: Cardiovascular;  Laterality: N/A;   CARDIOVERSION N/A 12/29/2021   Procedure: CARDIOVERSION;  Surgeon: Parke Poisson, MD;  Location: [facility] ENDOSCOPY;  Service: Cardiovascular;  Laterality: N/A;   CATARACT EXTRACTION     TOTAL KNEE ARTHROPLASTY Left 03/03/2022   Procedure: TOTAL KNEE ARTHROPLASTY;  Surgeon: Joen Laura, MD;  Location: [facility] ORS;  Service: Orthopedics;  Laterality: Left;    Allergies  No Known Allergies  Home Medications    Prior to Admission medications   Medication Sig Start Date End Date Taking? Authorizing Provider  amLODipine (NORVASC) 2.5 MG tablet Take 1 tablet (2.5 mg total) by mouth daily. 10/21/21 10/16/22  Croitoru, Mihai, MD  apixaban (ELIQUIS) 5 MG TABS tablet Take 5 mg by mouth 2 (two) times daily.    [provider]  diphenhydrAMINE-APAP, sleep, (TYLENOL PM EXTRA STRENGTH PO) Take 1 tablet by mouth at bedtime as needed (pain/sleep).    [provider]  Flaxseed, Linseed, (FLAXSEED OIL) 1000 MG CAPS Take 1,000 mg by mouth in the morning.    [provider]  flecainide (TAMBOCOR) 50 MG tablet Take 1 tablet (50 mg total) by mouth 2 (two) times daily. Please contact the office to schedule appointment for additional refills. 01/18/22   Croitoru, Mihai, MD  losartan (COZAAR) 50 MG tablet Take 50 mg by mouth 2 (two) times  daily. 07/20/21   [provider]  metoprolol succinate (TOPROL-XL) 25 MG 24 hr tablet Take 1 tablet (25 mg total) by mouth daily. 12/18/21   Croitoru, Mihai, MD  NON FORMULARY Take 1 tablet by mouth in the morning and at bedtime. [facility]( [facility])    [provider]  Omega-3 Fatty  Acids (FISH OIL) 1000 MG CAPS Take 1,000 mg by mouth in the morning.    [provider]  sennosides-docusate sodium (SENOKOT-S) 8.6-50 MG tablet Take 1 tablet by mouth daily.    [provider]    Physical Exam    Vital Signs:  Jonathan Woodward does not have vital signs available for review today.  Given telephonic nature of communication, physical exam is limited. AAOx3. NAD. Normal affect.  Speech and respirations are unlabored.  Accessory Clinical Findings    None  Assessment & Plan    1.  Preoperative Cardiovascular Risk Assessment:The patient is doing well from a cardiac perspective. Therefore, based on ACC/AHA guidelines, the patient would be at acceptable risk for the planned procedure without further cardiovascular testing. According to the Revised Cardiac Risk Index (RCRI), his Perioperative Risk of Major Cardiac Event is (%): 0.9.  His Functional Capacity in METs is: 7.04 according to the [facility] Activity Status Index (DASI).  The patient was advised that if he develops new symptoms prior to surgery to contact our office to arrange for a follow-up visit, and he verbalized understanding.  Per office protocol, patient can hold Eliquis for 3 days prior to procedure. He should resume Eliquis as soon as hemodynamically stable postoperatively.   A copy of this note will be routed to requesting surgeon.  Time:   Today, I have spent 7 minutes with the patient with telehealth technology discussing medical history, symptoms, and management plan.     Levi Aland, NP-C  04/22/2022, 9:01 AM [address] Office [phone] Fax [phone]

## 2022-04-22 ENCOUNTER — Ambulatory Visit: Payer: PPO | Attending: Cardiology | Admitting: Nurse Practitioner

## 2022-04-22 ENCOUNTER — Encounter: Payer: Self-pay | Admitting: Nurse Practitioner

## 2022-04-22 DIAGNOSIS — Z01810 Encounter for preprocedural cardiovascular examination: Secondary | ICD-10-CM

## 2022-05-03 DIAGNOSIS — M2560 Stiffness of unspecified joint, not elsewhere classified: Secondary | ICD-10-CM | POA: Diagnosis not present

## 2022-05-03 DIAGNOSIS — R5383 Other fatigue: Secondary | ICD-10-CM | POA: Diagnosis not present

## 2022-05-03 DIAGNOSIS — R700 Elevated erythrocyte sedimentation rate: Secondary | ICD-10-CM | POA: Diagnosis not present

## 2022-05-03 DIAGNOSIS — Z6828 Body mass index (BMI) 28.0-28.9, adult: Secondary | ICD-10-CM | POA: Diagnosis not present

## 2022-05-03 DIAGNOSIS — R768 Other specified abnormal immunological findings in serum: Secondary | ICD-10-CM | POA: Diagnosis not present

## 2022-05-03 DIAGNOSIS — R7982 Elevated C-reactive protein (CRP): Secondary | ICD-10-CM | POA: Diagnosis not present

## 2022-05-03 DIAGNOSIS — E663 Overweight: Secondary | ICD-10-CM | POA: Diagnosis not present

## 2022-05-12 DIAGNOSIS — Z1331 Encounter for screening for depression: Secondary | ICD-10-CM | POA: Diagnosis not present

## 2022-05-12 DIAGNOSIS — Z139 Encounter for screening, unspecified: Secondary | ICD-10-CM | POA: Diagnosis not present

## 2022-05-12 DIAGNOSIS — I4891 Unspecified atrial fibrillation: Secondary | ICD-10-CM | POA: Diagnosis not present

## 2022-05-12 DIAGNOSIS — M179 Osteoarthritis of knee, unspecified: Secondary | ICD-10-CM | POA: Diagnosis not present

## 2022-05-12 DIAGNOSIS — I10 Essential (primary) hypertension: Secondary | ICD-10-CM | POA: Diagnosis not present

## 2022-05-17 DIAGNOSIS — Z6828 Body mass index (BMI) 28.0-28.9, adult: Secondary | ICD-10-CM | POA: Diagnosis not present

## 2022-05-17 DIAGNOSIS — M170 Bilateral primary osteoarthritis of knee: Secondary | ICD-10-CM | POA: Diagnosis not present

## 2022-05-17 DIAGNOSIS — E663 Overweight: Secondary | ICD-10-CM | POA: Diagnosis not present

## 2022-05-17 DIAGNOSIS — M0579 Rheumatoid arthritis with rheumatoid factor of multiple sites without organ or systems involvement: Secondary | ICD-10-CM | POA: Diagnosis not present

## 2022-06-02 DIAGNOSIS — H52203 Unspecified astigmatism, bilateral: Secondary | ICD-10-CM | POA: Diagnosis not present

## 2022-06-02 DIAGNOSIS — D3132 Benign neoplasm of left choroid: Secondary | ICD-10-CM | POA: Diagnosis not present

## 2022-06-02 DIAGNOSIS — Z961 Presence of intraocular lens: Secondary | ICD-10-CM | POA: Diagnosis not present

## 2022-06-04 DIAGNOSIS — D649 Anemia, unspecified: Secondary | ICD-10-CM | POA: Diagnosis not present

## 2022-06-11 DIAGNOSIS — M1711 Unilateral primary osteoarthritis, right knee: Secondary | ICD-10-CM | POA: Diagnosis not present

## 2022-06-15 ENCOUNTER — Ambulatory Visit: Payer: Self-pay | Admitting: Physician Assistant

## 2022-06-15 DIAGNOSIS — G8929 Other chronic pain: Secondary | ICD-10-CM

## 2022-06-15 NOTE — Care Plan (Signed)
Ortho Bundle Case Management Note  Patient Details  Name: Jonathan Woodward MRN: [id number] Date of Birth: 02-02-1933   met with patient and daughter in the office for H&P. he will discharge to home with family to assist. has rolling walker at home. HHPT referral to [facility] and OPPT set up with [facility]. discharge instructions discussed and questions answered. appointments confirmed. Patient and MD in agreement. Choice offered.                   DME Arranged:    DME Agency:     HH Arranged:  PT HH Agency:  [facility]  Additional Comments: Please contact me with any questions of if this plan should need to change.  Ladell Heads,  [place] [facility]  [phone] 06/15/2022, 2:19 PM

## 2022-06-15 NOTE — Progress Notes (Signed)
Surgery orders requested via Epic inbox. °

## 2022-06-15 NOTE — H&P (Signed)
TOTAL KNEE ADMISSION H&P  Patient is being admitted for right total knee arthroplasty.  Subjective:  Chief Complaint:right knee pain.  HPI: Jonathan Woodward, 89 y.o. male, has a history of pain and functional disability in the right knee due to arthritis and has failed non-surgical conservative treatments for greater than 12 weeks to includeNSAID's and/or analgesics, corticosteriod injections, and activity modification.  Onset of symptoms was gradual, starting 8 years ago with gradually worsening course since that time. The patient noted no past surgery on the right knee(s).  Patient currently rates pain in the right knee(s) at 8 out of 10 with activity. Patient has night pain, worsening of pain with activity and weight bearing, pain that interferes with activities of daily living, pain with passive range of motion, crepitus, and joint swelling.  Patient has evidence of periarticular osteophytes and joint space narrowing by imaging studies. There is no active infection.  Patient Active Problem List   Diagnosis Date Noted  . Localized osteoarthritis of left knee 03/03/2022  . Persistent atrial fibrillation [facility])    Past Medical History:  Diagnosis Date  . Arthritis   . Atrial fibrillation ([place])   . Glaucoma   . HTN (hypertension)     Past Surgical History:  Procedure Laterality Date  . CARDIOVERSION N/A 10/16/2021   Procedure: CARDIOVERSION;  Surgeon: [name], MD;  Location: [facility] ENDOSCOPY;  Service: Cardiovascular;  Laterality: N/A;  . CARDIOVERSION N/A 12/29/2021   Procedure: CARDIOVERSION;  Surgeon: Elouise Munroe, MD;  Location: [facility] ENDOSCOPY;  Service: Cardiovascular;  Laterality: N/A;  . CATARACT EXTRACTION    . TOTAL KNEE ARTHROPLASTY Left 03/03/2022   Procedure: TOTAL KNEE ARTHROPLASTY;  Surgeon: Willaim Sheng, MD;  Location: [facility] ORS;  Service: Orthopedics;  Laterality: Left;    Current Outpatient Medications  Medication Sig Dispense Refill Last Dose  .  amLODipine (NORVASC) 2.5 MG tablet Take 1 tablet (2.5 mg total) by mouth daily. 90 tablet 3   . apixaban (ELIQUIS) 5 MG TABS tablet Take 5 mg by mouth 2 (two) times daily.     . diphenhydrAMINE-APAP, sleep, (TYLENOL PM EXTRA STRENGTH PO) Take 1 tablet by mouth at bedtime as needed (pain/sleep).     . Flaxseed, Linseed, (FLAXSEED OIL) 1000 MG CAPS Take 1,000 mg by mouth in the morning.     . flecainide (TAMBOCOR) 50 MG tablet Take 1 tablet (50 mg total) by mouth 2 (two) times daily. Please contact the office to schedule appointment for additional refills. 180 tablet 1   . losartan (COZAAR) 50 MG tablet Take 50 mg by mouth 2 (two) times daily.     . metoprolol succinate (TOPROL-XL) 25 MG 24 hr tablet Take 1 tablet (25 mg total) by mouth daily. 90 tablet 3   . NON FORMULARY Take 1 tablet by mouth in the morning and at bedtime. [facility]( [facility])     . Omega-3 Fatty Acids (FISH OIL) 1000 MG CAPS Take 1,000 mg by mouth in the morning.     . sennosides-docusate sodium (SENOKOT-S) 8.6-50 MG tablet Take 1 tablet by mouth daily.      No current facility-administered medications for this visit.   No Known Allergies  Social History   Tobacco Use  . Smoking status: Former    Types: Cigarettes, Pipe, Cigars    Quit date: 1980    Years since quitting: 44.2  . Smokeless tobacco: Former    Types: Chew, Snuff    Quit date: 1980  Substance  Use Topics  . Alcohol use: Never    No family history on file.   Review of Systems  Musculoskeletal:  Positive for arthralgias.  All other systems reviewed and are negative.  Objective:  Physical Exam Constitutional:      General: He is not in acute distress.    Appearance: Normal appearance.  HENT:     Head: Normocephalic and atraumatic.  Eyes:     Extraocular Movements: Extraocular movements intact.     Pupils: Pupils are equal, round, and reactive to light.  Cardiovascular:     Rate and Rhythm: Normal rate and regular rhythm.      Pulses: Normal pulses.     Heart sounds: Normal heart sounds.  Pulmonary:     Effort: Pulmonary effort is normal. No respiratory distress.     Breath sounds: Normal breath sounds. No wheezing.  Abdominal:     General: Abdomen is flat. Bowel sounds are normal. There is no distension.     Palpations: Abdomen is soft.     Tenderness: There is no abdominal tenderness.  Musculoskeletal:     Cervical back: Normal range of motion and neck supple.     Right knee: Swelling and bony tenderness present. No deformity, effusion or erythema. Decreased range of motion. Tenderness present over the medial joint line.  Lymphadenopathy:     Cervical: No cervical adenopathy.  Skin:    General: Skin is warm and dry.     Findings: No erythema or rash.  Neurological:     General: No focal deficit present.     Mental Status: He is alert and oriented to person, place, and time.  Psychiatric:        Mood and Affect: Mood normal.        Behavior: Behavior normal.   Vital signs in last 24 hours: '@VSRANGES'$ @  Labs:   Estimated body mass index is 27.28 kg/m as calculated from the following:   Height as of 03/03/22: '5\' 6"'$  (1.676 m).   Weight as of 03/03/22: 76.7 kg.   Imaging Review Plain radiographs demonstrate severe degenerative joint disease of the right knee(s). The overall alignment ismild varus. The bone quality appears to be good for age and reported activity level.      Assessment/Plan:  End stage arthritis, right knee   The patient history, physical examination, clinical judgment of the provider and imaging studies are consistent with end stage degenerative joint disease of the right knee(s) and total knee arthroplasty is deemed medically necessary. The treatment options including medical management, injection therapy arthroscopy and arthroplasty were discussed at length. The risks and benefits of total knee arthroplasty were presented and reviewed. The risks due to aseptic loosening,  infection, stiffness, patella tracking problems, thromboembolic complications and other imponderables were discussed. The patient acknowledged the explanation, agreed to proceed with the plan and consent was signed. Patient is being admitted for inpatient treatment for surgery, pain control, PT, OT, prophylactic antibiotics, VTE prophylaxis, progressive ambulation and ADL's and discharge planning. The patient is planning to be discharged home with home health services    Anticipated LOS equal to or greater than 2 midnights due to - Age 16 and older with one or more of the following:  - Obesity  - Expected need for hospital services (PT, OT, Nursing) required for safe  discharge  - Anticipated need for postoperative skilled nursing care or inpatient rehab  - Active co-morbidities: Cardiac Arrhythmia OR   - Unanticipated findings during/Post Surgery: None  -  Patient is a high risk of re-admission due to: None

## 2022-06-15 NOTE — H&P (View-Only) (Signed)
TOTAL KNEE ADMISSION H&P  Patient is being admitted for right total knee arthroplasty.  Subjective:  Chief Complaint:right knee pain.  HPI: Jonathan Woodward, 89 y.o. male, has a history of pain and functional disability in the right knee due to arthritis and has failed non-surgical conservative treatments for greater than 12 weeks to includeNSAID's and/or analgesics, corticosteriod injections, and activity modification.  Onset of symptoms was gradual, starting 8 years ago with gradually worsening course since that time. The patient noted no past surgery on the right knee(s).  Patient currently rates pain in the right knee(s) at 8 out of 10 with activity. Patient has night pain, worsening of pain with activity and weight bearing, pain that interferes with activities of daily living, pain with passive range of motion, crepitus, and joint swelling.  Patient has evidence of periarticular osteophytes and joint space narrowing by imaging studies. There is no active infection.  Patient Active Problem List   Diagnosis Date Noted  . Localized osteoarthritis of left knee 03/03/2022  . Persistent atrial fibrillation (HCC)    Past Medical History:  Diagnosis Date  . Arthritis   . Atrial fibrillation (HCC)   . Glaucoma   . HTN (hypertension)     Past Surgical History:  Procedure Laterality Date  . CARDIOVERSION N/A 10/16/2021   Procedure: CARDIOVERSION;  Surgeon: Hilty, Kenneth C, MD;  Location: [facility] ENDOSCOPY;  Service: Cardiovascular;  Laterality: N/A;  . CARDIOVERSION N/A 12/29/2021   Procedure: CARDIOVERSION;  Surgeon: Acharya, Gayatri A, MD;  Location: [facility] ENDOSCOPY;  Service: Cardiovascular;  Laterality: N/A;  . CATARACT EXTRACTION    . TOTAL KNEE ARTHROPLASTY Left 03/03/2022   Procedure: TOTAL KNEE ARTHROPLASTY;  Surgeon: Marchwiany, Daniel A, MD;  Location: [facility] ORS;  Service: Orthopedics;  Laterality: Left;    Current Outpatient Medications  Medication Sig Dispense Refill Last Dose  .  amLODipine (NORVASC) 2.5 MG tablet Take 1 tablet (2.5 mg total) by mouth daily. 90 tablet 3   . apixaban (ELIQUIS) 5 MG TABS tablet Take 5 mg by mouth 2 (two) times daily.     . diphenhydrAMINE-APAP, sleep, (TYLENOL PM EXTRA STRENGTH PO) Take 1 tablet by mouth at bedtime as needed (pain/sleep).     . Flaxseed, Linseed, (FLAXSEED OIL) 1000 MG CAPS Take 1,000 mg by mouth in the morning.     . flecainide (TAMBOCOR) 50 MG tablet Take 1 tablet (50 mg total) by mouth 2 (two) times daily. Please contact the office to schedule appointment for additional refills. 180 tablet 1   . losartan (COZAAR) 50 MG tablet Take 50 mg by mouth 2 (two) times daily.     . metoprolol succinate (TOPROL-XL) 25 MG 24 hr tablet Take 1 tablet (25 mg total) by mouth daily. 90 tablet 3   . NON FORMULARY Take 1 tablet by mouth in the morning and at bedtime. [facility]( [facility])     . Omega-3 Fatty Acids (FISH OIL) 1000 MG CAPS Take 1,000 mg by mouth in the morning.     . sennosides-docusate sodium (SENOKOT-S) 8.6-50 MG tablet Take 1 tablet by mouth daily.      No current facility-administered medications for this visit.   No Known Allergies  Social History   Tobacco Use  . Smoking status: Former    Types: Cigarettes, Pipe, Cigars    Quit date: 1980    Years since quitting: 44.2  . Smokeless tobacco: Former    Types: Chew, Snuff    Quit date: 1980  Substance   Use Topics  . Alcohol use: Never    No family history on file.   Review of Systems  Musculoskeletal:  Positive for arthralgias.  All other systems reviewed and are negative.  Objective:  Physical Exam Constitutional:      General: He is not in acute distress.    Appearance: Normal appearance.  HENT:     Head: Normocephalic and atraumatic.  Eyes:     Extraocular Movements: Extraocular movements intact.     Pupils: Pupils are equal, round, and reactive to light.  Cardiovascular:     Rate and Rhythm: Normal rate and regular rhythm.      Pulses: Normal pulses.     Heart sounds: Normal heart sounds.  Pulmonary:     Effort: Pulmonary effort is normal. No respiratory distress.     Breath sounds: Normal breath sounds. No wheezing.  Abdominal:     General: Abdomen is flat. Bowel sounds are normal. There is no distension.     Palpations: Abdomen is soft.     Tenderness: There is no abdominal tenderness.  Musculoskeletal:     Cervical back: Normal range of motion and neck supple.     Right knee: Swelling and bony tenderness present. No deformity, effusion or erythema. Decreased range of motion. Tenderness present over the medial joint line.  Lymphadenopathy:     Cervical: No cervical adenopathy.  Skin:    General: Skin is warm and dry.     Findings: No erythema or rash.  Neurological:     General: No focal deficit present.     Mental Status: He is alert and oriented to person, place, and time.  Psychiatric:        Mood and Affect: Mood normal.        Behavior: Behavior normal.   Vital signs in last 24 hours: @VSRANGES@  Labs:   Estimated body mass index is 27.28 kg/m as calculated from the following:   Height as of 03/03/22: 5' 6" (1.676 m).   Weight as of 03/03/22: 76.7 kg.   Imaging Review Plain radiographs demonstrate severe degenerative joint disease of the right knee(s). The overall alignment ismild varus. The bone quality appears to be good for age and reported activity level.      Assessment/Plan:  End stage arthritis, right knee   The patient history, physical examination, clinical judgment of the provider and imaging studies are consistent with end stage degenerative joint disease of the right knee(s) and total knee arthroplasty is deemed medically necessary. The treatment options including medical management, injection therapy arthroscopy and arthroplasty were discussed at length. The risks and benefits of total knee arthroplasty were presented and reviewed. The risks due to aseptic loosening,  infection, stiffness, patella tracking problems, thromboembolic complications and other imponderables were discussed. The patient acknowledged the explanation, agreed to proceed with the plan and consent was signed. Patient is being admitted for inpatient treatment for surgery, pain control, PT, OT, prophylactic antibiotics, VTE prophylaxis, progressive ambulation and ADL's and discharge planning. The patient is planning to be discharged home with home health services    Anticipated LOS equal to or greater than 2 midnights due to - Age 65 and older with one or more of the following:  - Obesity  - Expected need for hospital services (PT, OT, Nursing) required for safe  discharge  - Anticipated need for postoperative skilled nursing care or inpatient rehab  - Active co-morbidities: Cardiac Arrhythmia OR   - Unanticipated findings during/Post Surgery: None  -   Patient is a high risk of re-admission due to: None 

## 2022-06-19 NOTE — Progress Notes (Signed)
COVID Vaccine received:  '[]'$  No '[x]'$  Yes Date of any COVID positive Test in last 90 days:  PCP - Cyndi Bender PA-C [facility] [phone]   fax) [phone] Cardiologist - Sanda Klein, MD   Christen Bame, NP  cardiac clearance 04-22-2022 telephone note in [facility] Aurora, MD  Chest x-ray -  EKG - 01-13-2022  Epic  Stress Test - Carlton Adam 11-04-2021  epic ECHO - 10-10-2021  Epic Cardiac Cath -   PCR screen: '[x]'$  Ordered & Completed                      '[]'$   No Order but Needs PROFEND                      '[]'$   N/A for this surgery  Surgery Plan:  '[]'$  Ambulatory                            '[x]'$  Outpatient in bed                            '[]'$  Admit  Anesthesia:    '[]'$  General  '[x]'$  Spinal                           '[]'$   Choice '[]'$   MAC  Pacemaker / ICD device '[x]'$  No '[]'$  Yes        Device order form faxed '[x]'$  No    '[]'$   Yes      Faxed to:  Spinal Cord Stimulator:'[x]'$  No '[]'$  Yes      (Remind patient to bring remote DOS) Other Implants:   History of Sleep Apnea? '[x]'$  No '[]'$  Yes   CPAP used?- '[x]'$  No '[]'$  Yes    Does the patient monitor blood sugar? '[]'$  No '[]'$  Yes  '[x]'$  N/A  Blood Thinner / Instructions: Eliquis  Hold 2-3  Days, ok'd Aspirin Instructions: none  ERAS Protocol Ordered: '[]'$  No  '[x]'$  Yes PRE-SURGERY '[x]'$  ENSURE  '[]'$  G2  Patient is to be NPO after: 06:15 am  Comments:   Activity level: Patient can not climb a flight of stairs without difficulty; Patient can not perform ADLs without assistance.   Anesthesia review: A. Fib- cardioversions x2, HTN, glaucoma, RA  Patient denies shortness of breath, fever, cough and chest pain at PAT appointment.  Patient verbalized understanding and agreement to the Pre-Surgical Instructions that were given to them at this PAT appointment. Patient was also educated of the need to review these PAT instructions again prior to his surgery.I reviewed the appropriate phone numbers to call if they have any and  questions or concerns.

## 2022-06-19 NOTE — Patient Instructions (Signed)
SURGICAL WAITING ROOM VISITATION Patients having surgery or a procedure may have no more than 2 support people in the waiting area - these visitors may rotate in the visitor waiting room.   Due to an increase in RSV and influenza rates and associated hospitalizations, children ages 71 and under may not visit patients in [place]. If the patient needs to stay at the hospital during part of their recovery, the visitor guidelines for inpatient rooms apply.  PRE-OP VISITATION  Pre-op nurse will coordinate an appropriate time for 1 support person to accompany the patient in pre-op.  This support person may not rotate.  This visitor will be contacted when the time is appropriate for the visitor to come back in the pre-op area.  Please refer to the [facility] website for the visitor guidelines for Inpatients (after your surgery is over and you are in a regular room).  You are not required to quarantine at this time prior to your surgery. However, you must do this: Hand Hygiene often Do NOT share personal items Notify your provider if you are in close contact with someone who has COVID or you develop fever 100.4 or greater, new onset of sneezing, cough, sore throat, shortness of breath or body aches.  If you test positive for Covid or have been in contact with anyone that has tested positive in the last 10 days please notify you surgeon.    Your procedure is scheduled on:  Friday  July 02, 2022  Report to [facility] Main Entrance: [place] entrance where the [employer] is available.   Report to admitting at: 06:45 am  +++++Call this number if you have any questions or problems the morning of surgery [phone]  Do not eat food after Midnight the night prior to your surgery/procedure.  After Midnight you may have the following liquids until 06:15  AM DAY OF SURGERY  Clear Liquid Diet Water Black Coffee (sugar ok, NO MILK/CREAM OR CREAMERS)  Tea (sugar ok, NO  MILK/CREAM OR CREAMERS) regular and decaf                             Plain Jell-O  with no fruit (NO RED)                                           Fruit ices (not with fruit pulp, NO RED)                                     Popsicles (NO RED)                                                                  Juice: apple, WHITE grape, WHITE cranberry Sports drinks like Gatorade or Powerade (NO RED)                    The day of surgery:  Drink ONE (1) Pre-Surgery Clear Ensure at  06:15   AM the morning of surgery. Drink in one sitting. Do  not sip.  This drink was given to you during your hospital pre-op appointment visit. Nothing else to drink after completing the Pre-Surgery Clear Ensure : No candy, chewing gum or throat lozenges.    FOLLOW ANY ADDITIONAL PRE OP INSTRUCTIONS YOU RECEIVED FROM YOUR SURGEON'S OFFICE!!!   Oral Hygiene is also important to reduce your risk of infection.        Remember - BRUSH YOUR TEETH THE MORNING OF SURGERY WITH YOUR REGULAR TOOTHPASTE  Do NOT smoke after Midnight the night before surgery.  Take ONLY these medicines the morning of surgery with A SIP OF WATER: Flecainide (Tambocor, amlodipine, metoprolol                   You may not have any metal on your body including jewelry, and body piercing  Do not wear lotions, powders, cologne, or deodorant  Men may shave face and neck.  Contacts, Hearing Aids, dentures or bridgework may not be worn into surgery. DENTURES WILL BE REMOVED PRIOR TO SURGERY PLEASE DO NOT APPLY "Poly grip" OR ADHESIVES!!!  You may bring a small overnight bag with you on the day of surgery, only pack items that are not valuable. [place] IS NOT RESPONSIBLE   FOR VALUABLES THAT ARE LOST OR STOLEN.   Do not bring your home medications to the hospital. The Pharmacy will dispense medications listed on your medication list to you during your admission in the Hospital.  Special Instructions: Bring a copy of your healthcare  power of attorney and living will documents the day of surgery, if you wish to have them scanned into your [facility] Medical Records- EPIC  Please read over the following fact sheets you were given: IF YOU HAVE QUESTIONS ABOUT YOUR PRE-OP INSTRUCTIONS, PLEASE CALL [phone]  Jonathan Woodward)   [facility] - Preparing for Surgery Before surgery, you can play an important role.  Because skin is not sterile, your skin needs to be as free of germs as possible.  You can reduce the number of germs on your skin by washing with CHG (chlorahexidine gluconate) soap before surgery.  CHG is an antiseptic cleaner which kills germs and bonds with the skin to continue killing germs even after washing. Please DO NOT use if you have an allergy to CHG or antibacterial soaps.  If your skin becomes reddened/irritated stop using the CHG and inform your nurse when you arrive at Short Stay. Do not shave (including legs and underarms) for at least 48 hours prior to the first CHG shower.  You may shave your face/neck.  Please follow these instructions carefully:  1.  Shower with CHG Soap the night before surgery and the  morning of surgery.  2.  If you choose to wash your hair, wash your hair first as usual with your normal  shampoo.  3.  After you shampoo, rinse your hair and body thoroughly to remove the shampoo.                             4.  Use CHG as you would any other liquid soap.  You can apply chg directly to the skin and wash.  Gently with a scrungie or clean washcloth.  5.  Apply the CHG Soap to your body ONLY FROM THE NECK DOWN.   Do not use on face/ open  Wound or open sores. Avoid contact with eyes, ears mouth and genitals (private parts).                       Wash face,  Genitals (private parts) with your normal soap.             6.  Wash thoroughly, paying special attention to the area where your  surgery  will be performed.  7.  Thoroughly rinse your body with warm water from the neck  down.  8.  DO NOT shower/wash with your normal soap after using and rinsing off the CHG Soap.            9.  Pat yourself dry with a clean towel.            10.  Wear clean pajamas.            11.  Place clean sheets on your bed the night of your first shower and do not  sleep with pets.  ON THE DAY OF SURGERY : Do not apply any lotions/deodorants the morning of surgery.  Please wear clean clothes to the hospital/surgery center.    FAILURE TO FOLLOW THESE INSTRUCTIONS MAY RESULT IN THE CANCELLATION OF YOUR SURGERY  PATIENT SIGNATURE_________________________________  NURSE SIGNATURE__________________________________  ________________________________________________________________________        Jonathan Woodward    An incentive spirometer is a tool that can help keep your lungs clear and active. This tool measures how well you are filling your lungs with each breath. Taking long deep breaths may help reverse or decrease the chance of developing breathing (pulmonary) problems (especially infection) following: A long period of time when you are unable to move or be active. BEFORE THE PROCEDURE  If the spirometer includes an indicator to show your best effort, your nurse or respiratory therapist will set it to a desired goal. If possible, sit up straight or lean slightly forward. Try not to slouch. Hold the incentive spirometer in an upright position. INSTRUCTIONS FOR USE  Sit on the edge of your bed if possible, or sit up as far as you can in bed or on a chair. Hold the incentive spirometer in an upright position. Breathe out normally. Place the mouthpiece in your mouth and seal your lips tightly around it. Breathe in slowly and as deeply as possible, raising the piston or the ball toward the top of the column. Hold your breath for 3-5 seconds or for as long as possible. Allow the piston or ball to fall to the bottom of the column. Remove the mouthpiece from your mouth and  breathe out normally. Rest for a few seconds and repeat Steps 1 through 7 at least 10 times every 1-2 hours when you are awake. Take your time and take a few normal breaths between deep breaths. The spirometer may include an indicator to show your best effort. Use the indicator as a goal to work toward during each repetition. After each set of 10 deep breaths, practice coughing to be sure your lungs are clear. If you have an incision (the cut made at the time of surgery), support your incision when coughing by placing a pillow or rolled up towels firmly against it. Once you are able to get out of bed, walk around indoors and cough well. You may stop using the incentive spirometer when instructed by your caregiver.  RISKS AND COMPLICATIONS Take your time so you do not get dizzy or light-headed. If you  are in pain, you may need to take or ask for pain medication before doing incentive spirometry. It is harder to take a deep breath if you are having pain. AFTER USE Rest and breathe slowly and easily. It can be helpful to keep track of a log of your progress. Your caregiver can provide you with a simple table to help with this. If you are using the spirometer at home, follow these instructions: [place] IF:  You are having difficultly using the spirometer. You have trouble using the spirometer as often as instructed. Your pain medication is not giving enough relief while using the spirometer. You develop fever of 100.5 F (38.1 C) or higher.                                                                                                    SEEK IMMEDIATE MEDICAL CARE IF:  You cough up bloody sputum that had not been present before. You develop fever of 102 F (38.9 C) or greater. You develop worsening pain at or near the incision site. MAKE SURE YOU:  Understand these instructions. Will watch your condition. Will get help right away if you are not doing well or get worse. Document  Released: 08/09/2006 Document Revised: 06/21/2011 Document Reviewed: 10/10/2006 [facility] Patient Information 2014 [place], [place].      WHAT IS A BLOOD TRANSFUSION? Blood Transfusion Information  A transfusion is the replacement of blood or some of its parts. Blood is made up of multiple cells which provide different functions. Red blood cells carry oxygen and are used for blood loss replacement. White blood cells fight against infection. Platelets control bleeding. Plasma helps clot blood. Other blood products are available for specialized needs, such as hemophilia or other clotting disorders. BEFORE THE TRANSFUSION  Who gives blood for transfusions?  Healthy volunteers who are fully evaluated to make sure their blood is safe. This is blood bank blood. Transfusion therapy is the safest it has ever been in the practice of medicine. Before blood is taken from a donor, a complete history is taken to make sure that person has no history of diseases nor engages in risky social behavior (examples are intravenous drug use or sexual activity with multiple partners). The donor's travel history is screened to minimize risk of transmitting infections, such as malaria. The donated blood is tested for signs of infectious diseases, such as HIV and hepatitis. The blood is then tested to be sure it is compatible with you in order to minimize the chance of a transfusion reaction. If you or a relative donates blood, this is often done in anticipation of surgery and is not appropriate for emergency situations. It takes many days to process the donated blood. RISKS AND COMPLICATIONS Although transfusion therapy is very safe and saves many lives, the main dangers of transfusion include:  Getting an infectious disease. Developing a transfusion reaction. This is an allergic reaction to something in the blood you were given. Every precaution is taken to prevent this. The decision to have a blood transfusion has been  considered carefully by your  caregiver before blood is given. Blood is not given unless the benefits outweigh the risks. AFTER THE TRANSFUSION Right after receiving a blood transfusion, you will usually feel much better and more energetic. This is especially true if your red blood cells have gotten low (anemic). The transfusion raises the level of the red blood cells which carry oxygen, and this usually causes an energy increase. The nurse administering the transfusion will monitor you carefully for complications. HOME CARE INSTRUCTIONS  No special instructions are needed after a transfusion. You may find your energy is better. Speak with your caregiver about any limitations on activity for underlying diseases you may have. SEEK MEDICAL CARE IF:  Your condition is not improving after your transfusion. You develop redness or irritation at the intravenous (IV) site. SEEK IMMEDIATE MEDICAL CARE IF:  Any of the following symptoms occur over the next 12 hours: Shaking chills. You have a temperature by mouth above 102 F (38.9 C), not controlled by medicine. Chest, back, or muscle pain. People around you feel you are not acting correctly or are confused. Shortness of breath or difficulty breathing. Dizziness and fainting. You get a rash or develop hives. You have a decrease in urine output. Your urine turns a dark color or changes to pink, red, or brown. Any of the following symptoms occur over the next 10 days: You have a temperature by mouth above 102 F (38.9 C), not controlled by medicine. Shortness of breath. Weakness after normal activity. The white part of the eye turns yellow (jaundice). You have a decrease in the amount of urine or are urinating less often. Your urine turns a dark color or changes to pink, red, or brown. Document Released: 03/26/2000 Document Revised: 06/21/2011 Document Reviewed: 11/13/2007 [facility] Patient Information 2014 [place],  [place].  _______________________________________________________________________

## 2022-06-22 ENCOUNTER — Encounter (HOSPITAL_COMMUNITY): Payer: Self-pay

## 2022-06-22 ENCOUNTER — Encounter (HOSPITAL_COMMUNITY)
Admission: RE | Admit: 2022-06-22 | Discharge: 2022-06-22 | Disposition: A | Discharge status: Home / Self Care | Payer: PPO | Source: Ambulatory Visit | Attending: Orthopedic Surgery | Admitting: Orthopedic Surgery

## 2022-06-22 ENCOUNTER — Other Ambulatory Visit: Payer: Self-pay

## 2022-06-22 VITALS — BP 178/73 | HR 59 | Temp 97.9°F | Resp 14 | Ht 66.0 in | Wt 170.0 lb

## 2022-06-22 DIAGNOSIS — M1711 Unilateral primary osteoarthritis, right knee: Secondary | ICD-10-CM | POA: Insufficient documentation

## 2022-06-22 DIAGNOSIS — Z01812 Encounter for preprocedural laboratory examination: Secondary | ICD-10-CM | POA: Diagnosis not present

## 2022-06-22 DIAGNOSIS — I4891 Unspecified atrial fibrillation: Secondary | ICD-10-CM | POA: Insufficient documentation

## 2022-06-22 DIAGNOSIS — G8929 Other chronic pain: Secondary | ICD-10-CM | POA: Diagnosis not present

## 2022-06-22 DIAGNOSIS — Z87891 Personal history of nicotine dependence: Secondary | ICD-10-CM | POA: Diagnosis not present

## 2022-06-22 DIAGNOSIS — I10 Essential (primary) hypertension: Secondary | ICD-10-CM | POA: Diagnosis not present

## 2022-06-22 DIAGNOSIS — I4510 Unspecified right bundle-branch block: Secondary | ICD-10-CM | POA: Insufficient documentation

## 2022-06-22 DIAGNOSIS — Z01818 Encounter for other preprocedural examination: Secondary | ICD-10-CM

## 2022-06-22 HISTORY — DX: Cardiac arrhythmia, unspecified: I49.9

## 2022-06-22 LAB — CBC WITH DIFFERENTIAL/PLATELET
Abs Immature Granulocytes: 0.02 10*3/uL (ref 0.00–0.07)
Basophils Absolute: 0 10*3/uL (ref 0.0–0.1)
Basophils Relative: 1 %
Eosinophils Absolute: 0.1 10*3/uL (ref 0.0–0.5)
Eosinophils Relative: 1 %
HCT: 39 % (ref 39.0–52.0)
Hemoglobin: 12.3 g/dL — ABNORMAL LOW (ref 13.0–17.0)
Immature Granulocytes: 0 %
Lymphocytes Relative: 16 %
Lymphs Abs: 0.8 10*3/uL (ref 0.7–4.0)
MCH: 28.9 pg (ref 26.0–34.0)
MCHC: 31.5 g/dL (ref 30.0–36.0)
MCV: 91.5 fL (ref 80.0–100.0)
Monocytes Absolute: 0.5 10*3/uL (ref 0.1–1.0)
Monocytes Relative: 10 %
Neutro Abs: 3.9 10*3/uL (ref 1.7–7.7)
Neutrophils Relative %: 72 %
Platelets: 152 10*3/uL (ref 150–400)
RBC: 4.26 MIL/uL (ref 4.22–5.81)
RDW: 16 % — ABNORMAL HIGH (ref 11.5–15.5)
WBC: 5.4 10*3/uL (ref 4.0–10.5)
nRBC: 0 % (ref 0.0–0.2)

## 2022-06-22 LAB — COMPREHENSIVE METABOLIC PANEL
ALT: 13 U/L (ref 0–44)
AST: 15 U/L (ref 15–41)
Albumin: 3.8 g/dL (ref 3.5–5.0)
Alkaline Phosphatase: 87 U/L (ref 38–126)
Anion gap: 9 (ref 5–15)
BUN: 13 mg/dL (ref 8–23)
CO2: 24 mmol/L (ref 22–32)
Calcium: 8.9 mg/dL (ref 8.9–10.3)
Chloride: 100 mmol/L (ref 98–111)
Creatinine, Ser: 0.82 mg/dL (ref 0.61–1.24)
GFR, Estimated: >60 mL/min (ref >60)
Glucose, Bld: 81 mg/dL (ref 70–99)
Potassium: 4 mmol/L (ref 3.5–5.1)
Sodium: 133 mmol/L — ABNORMAL LOW (ref 135–145)
Total Bilirubin: 0.5 mg/dL (ref 0.3–1.2)
Total Protein: 6.7 g/dL (ref 6.5–8.1)

## 2022-06-22 LAB — SURGICAL PCR SCREEN
MRSA, PCR: NEGATIVE
Staphylococcus aureus: NEGATIVE

## 2022-06-24 NOTE — Progress Notes (Signed)
Anesthesia Chart Review   Case: [id number] Date/Time: 07/02/22 1112   Procedure: TOTAL KNEE ARTHROPLASTY (Right: Knee)   Anesthesia type: Spinal   Pre-op diagnosis: OA RIGHT KNEE   Location: [facility] ROOM 07 / [facility]   Surgeons: Willaim Sheng, MD       DISCUSSION:89 y.o. former smoker with h/o HTN, atrial fibrillation, RBBB, right knee OA scheduled for above procedure 07/02/22 with Dr. Charlies Constable.   Pt last seen by cardiology 04/22/2022. Per OV note, "Preoperative Cardiovascular Risk Assessment:The patient is doing well from a cardiac perspective. Therefore, based on ACC/AHA guidelines, the patient would be at acceptable risk for the planned procedure without further cardiovascular testing. According to the Revised Cardiac Risk Index (RCRI), his Perioperative Risk of Major Cardiac Event is (%): 0.9.  His Functional Capacity in METs is: 7.04 according to the [facility] Activity Status Index (DASI).   The patient was advised that if he develops new symptoms prior to surgery to contact our office to arrange for a follow-up visit, and he verbalized understanding.   Per office protocol, patient can hold Eliquis for 3 days prior to procedure. He should resume Eliquis as soon as hemodynamically stable postoperatively"  Anticipate pt can proceed with planned procedure barring acute status change.   VS: BP (!) 178/73   Pulse (!) 59   Temp 36.6 C (Oral)   Resp 14   Ht '5\' 6"'$  (1.676 m)   Wt 77.1 kg   SpO2 99%   BMI 27.44 kg/m   PROVIDERS: Cyndi Bender, PA-C is PCP   Cardiologist - Sanda Klein, MD  LABS: Labs reviewed: Acceptable for surgery. (all labs ordered are listed, but only abnormal results are displayed)  Labs Reviewed  CBC WITH DIFFERENTIAL/PLATELET - Abnormal; Notable for the following components:      Result Value   Hemoglobin 12.3 (*)    RDW 16.0 (*)    All other components within normal limits  COMPREHENSIVE METABOLIC PANEL - Abnormal; Notable for the following  components:   Sodium 133 (*)    All other components within normal limits  SURGICAL PCR SCREEN  TYPE AND SCREEN     IMAGES:   EKG:   CV: Myocardial Perfusion 11/04/2021   Findings are consistent with no prior ischemia. The study is low risk.   No ST deviation was noted.   Left ventricular function is abnormal. Global function is mildly reduced. Nuclear stress EF: 49 %. The left ventricular ejection fraction is mildly decreased (45-54%). End diastolic cavity size is normal.   Prior study not available for comparison.   Low risk stress nuclear study with normal perfusion. There is mild reduction in left ventricular systolic function, but this may be related to poor gating in the setting of atrial fibrillation.  Echo 10/09/2021 1. Left ventricular ejection fraction, by estimation, is 65 to 70%. The  left ventricle has normal function. The left ventricle has no regional  wall motion abnormalities. There is mild left ventricular hypertrophy.  Left ventricular diastolic parameters  are indeterminate. Elevated left ventricular end-diastolic pressure. The  E/e' is 9.   2. Right ventricular systolic function is mildly reduced. The right  ventricular size is mildly enlarged. There is moderately elevated  pulmonary artery systolic pressure. The estimated right ventricular  systolic pressure is 123456 mmHg.   3. Left atrial size was severely dilated.   4. Right atrial size was moderately dilated.   5. The mitral valve is grossly normal. Trivial mitral valve  regurgitation. No  evidence of mitral stenosis.   6. Tricuspid valve regurgitation is mild to moderate.   7. The aortic valve is grossly normal. There is mild calcification of the  aortic valve. Aortic valve regurgitation is trivial. No aortic stenosis is  present.   8. The inferior vena cava is normal in size with <50% respiratory  variability, suggesting right atrial pressure of 8 mmHg.  Past Medical History:  Diagnosis Date    Arthritis    Rheumatoid Arthritis   Atrial fibrillation ([place])    Dysrhythmia    Glaucoma    HTN (hypertension)     Past Surgical History:  Procedure Laterality Date   CARDIOVERSION N/A 10/16/2021   Procedure: CARDIOVERSION;  Surgeon: [name], MD;  [facility] ENDOSCOPY;  Service: Cardiovascular;  Laterality: N/A;   CARDIOVERSION N/A 12/29/2021   Procedure: CARDIOVERSION;  Surgeon: Elouise Munroe, MD;  Location: [facility] ENDOSCOPY;  Service: Cardiovascular;  Laterality: N/A;   CATARACT EXTRACTION     TOTAL KNEE ARTHROPLASTY Left 03/03/2022   Procedure: TOTAL KNEE ARTHROPLASTY;  Surgeon: Willaim Sheng, MD;  Location: [facility]S;  Service: Orthopedics;  Laterality: Left;    MEDICATIONS:  methotrexate (RHEUMATREX) 5 MG tablet   predniSONE (DELTASONE) 5 MG tablet   amLODipine (NORVASC) 2.5 MG tablet   apixaban (ELIQUIS) 5 MG TABS tablet   Flaxseed, Linseed, (FLAXSEED OIL) 1000 MG CAPS   flecainide (TAMBOCOR) 50 MG tablet   folic acid (FOLVITE) A999333 MCG tablet   losartan (COZAAR) 50 MG tablet   metoprolol succinate (TOPROL-XL) 25 MG 24 hr tablet   Omega-3 Fatty Acids (FISH OIL) 1000 MG CAPS   sennosides-docusate sodium (SENOKOT-S) 8.6-50 MG tablet   No current facility-administered medications for this encounter.     Jonathan Felix Ward, PA-C [facility]Surgical Testing [phone]

## 2022-06-24 NOTE — Anesthesia Preprocedure Evaluation (Addendum)
Anesthesia Evaluation  Patient identified by MRN, date of birth, ID band Patient awake    Reviewed: Allergy & Precautions, H&P , NPO status , Patient's Chart, lab work & pertinent test results  Airway Mallampati: II   Neck ROM: full    Dental   Pulmonary former smoker   breath sounds clear to auscultation       Cardiovascular hypertension, + dysrhythmias Atrial Fibrillation  Rhythm:irregular Rate:Normal     Neuro/Psych    GI/Hepatic   Endo/Other    Renal/GU      Musculoskeletal  (+) Arthritis ,    Abdominal   Peds  Hematology   Anesthesia Other Findings   Reproductive/Obstetrics                             Anesthesia Physical Anesthesia Plan  ASA: 3  Anesthesia Plan: Spinal and MAC   Post-op Pain Management: Regional block*   Induction: Intravenous  PONV Risk Score and Plan: 1 and Propofol infusion, Midazolam, Ondansetron and Treatment may vary due to age or medical condition  Airway Management Planned: Simple Face Mask  Additional Equipment:   Intra-op Plan:   Post-operative Plan:   Informed Consent: I have reviewed the patients History and Physical, chart, labs and discussed the procedure including the risks, benefits and alternatives for the proposed anesthesia with the patient or authorized representative who has indicated his/her understanding and acceptance.     Dental advisory given  Plan Discussed with: CRNA, Anesthesiologist and Surgeon  Anesthesia Plan Comments: (See PAT note 06/22/22)       Anesthesia Quick Evaluation

## 2022-07-01 DIAGNOSIS — M1712 Unilateral primary osteoarthritis, left knee: Secondary | ICD-10-CM | POA: Diagnosis not present

## 2022-07-02 ENCOUNTER — Ambulatory Visit (HOSPITAL_BASED_OUTPATIENT_CLINIC_OR_DEPARTMENT_OTHER): Payer: PPO | Admitting: Anesthesiology

## 2022-07-02 ENCOUNTER — Ambulatory Visit (HOSPITAL_COMMUNITY): Payer: PPO | Admitting: Physician Assistant

## 2022-07-02 ENCOUNTER — Observation Stay (HOSPITAL_COMMUNITY)
Admission: RE | Admit: 2022-07-02 | Discharge: 2022-07-03 | Disposition: A | Discharge status: Home Health | Payer: PPO | Source: Ambulatory Visit | Attending: Orthopedic Surgery | Admitting: Orthopedic Surgery

## 2022-07-02 ENCOUNTER — Encounter (HOSPITAL_COMMUNITY): Payer: Self-pay | Admitting: Orthopedic Surgery

## 2022-07-02 ENCOUNTER — Other Ambulatory Visit: Payer: Self-pay | Admitting: *Deleted

## 2022-07-02 ENCOUNTER — Encounter (HOSPITAL_COMMUNITY)
Admission: RE | Disposition: A | Discharge status: Home Health | Payer: Self-pay | Source: Ambulatory Visit | Attending: Orthopedic Surgery

## 2022-07-02 ENCOUNTER — Other Ambulatory Visit: Payer: Self-pay

## 2022-07-02 ENCOUNTER — Observation Stay (HOSPITAL_COMMUNITY): Payer: PPO

## 2022-07-02 DIAGNOSIS — I4891 Unspecified atrial fibrillation: Secondary | ICD-10-CM | POA: Insufficient documentation

## 2022-07-02 DIAGNOSIS — I10 Essential (primary) hypertension: Secondary | ICD-10-CM

## 2022-07-02 DIAGNOSIS — Z96652 Presence of left artificial knee joint: Secondary | ICD-10-CM | POA: Diagnosis not present

## 2022-07-02 DIAGNOSIS — M1712 Unilateral primary osteoarthritis, left knee: Secondary | ICD-10-CM

## 2022-07-02 DIAGNOSIS — M1711 Unilateral primary osteoarthritis, right knee: Secondary | ICD-10-CM | POA: Diagnosis not present

## 2022-07-02 DIAGNOSIS — Z87891 Personal history of nicotine dependence: Secondary | ICD-10-CM | POA: Diagnosis not present

## 2022-07-02 DIAGNOSIS — Z79899 Other long term (current) drug therapy: Secondary | ICD-10-CM | POA: Insufficient documentation

## 2022-07-02 DIAGNOSIS — Z471 Aftercare following joint replacement surgery: Secondary | ICD-10-CM | POA: Diagnosis not present

## 2022-07-02 DIAGNOSIS — Z96651 Presence of right artificial knee joint: Secondary | ICD-10-CM | POA: Diagnosis not present

## 2022-07-02 DIAGNOSIS — Z7901 Long term (current) use of anticoagulants: Secondary | ICD-10-CM | POA: Insufficient documentation

## 2022-07-02 DIAGNOSIS — G8918 Other acute postprocedural pain: Secondary | ICD-10-CM | POA: Diagnosis not present

## 2022-07-02 HISTORY — PX: TOTAL KNEE ARTHROPLASTY: SHX125

## 2022-07-02 LAB — TYPE AND SCREEN
ABO/RH(D): O POS
Antibody Screen: NEGATIVE

## 2022-07-02 LAB — ABO/RH: ABO/RH(D): O POS

## 2022-07-02 SURGERY — ARTHROPLASTY, KNEE, TOTAL
Anesthesia: Regional | Site: Knee | Laterality: Right

## 2022-07-02 MED ORDER — HYDROMORPHONE HCL 2 MG/ML IJ SOLN
INTRAMUSCULAR | Status: AC
  Filled 2022-07-02: qty 1

## 2022-07-02 MED ORDER — ROPIVACAINE HCL 5 MG/ML IJ SOLN
INTRAMUSCULAR | Status: DC | PRN
  Administered 2022-07-02: 25 mL via PERINEURAL

## 2022-07-02 MED ORDER — PHENYLEPHRINE HCL (PRESSORS) 10 MG/ML IV SOLN
INTRAVENOUS | Status: AC
  Filled 2022-07-02: qty 1

## 2022-07-02 MED ORDER — SODIUM CHLORIDE (PF) 0.9 % IJ SOLN
INTRAMUSCULAR | Status: AC
  Filled 2022-07-02: qty 50

## 2022-07-02 MED ORDER — CEFAZOLIN SODIUM-DEXTROSE 2-4 GM/100ML-% IV SOLN
2.0000 g | Freq: Four times a day (QID) | INTRAVENOUS | Status: AC
  Administered 2022-07-02 (×2): 2 g via INTRAVENOUS
  Filled 2022-07-02 (×2): qty 100

## 2022-07-02 MED ORDER — METOPROLOL SUCCINATE ER 25 MG PO TB24
25.0000 mg | ORAL_TABLET | Freq: Every day | ORAL | Status: DC
  Administered 2022-07-03: 25 mg via ORAL
  Filled 2022-07-02: qty 1

## 2022-07-02 MED ORDER — GLYCOPYRROLATE 0.2 MG/ML IJ SOLN
INTRAMUSCULAR | Status: DC | PRN
  Administered 2022-07-02: .1 mg via INTRAVENOUS

## 2022-07-02 MED ORDER — SODIUM CHLORIDE 0.9% FLUSH
INTRAVENOUS | Status: DC | PRN
  Administered 2022-07-02: 60 mL

## 2022-07-02 MED ORDER — LACTATED RINGERS IV SOLN: INTRAVENOUS | Status: DC

## 2022-07-02 MED ORDER — LOSARTAN POTASSIUM 50 MG PO TABS: 50.0000 mg | ORAL_TABLET | Freq: Two times a day (BID) | ORAL | Status: DC

## 2022-07-02 MED ORDER — APIXABAN 2.5 MG PO TABS
2.5000 mg | ORAL_TABLET | Freq: Two times a day (BID) | ORAL | Status: DC
  Administered 2022-07-03: 2.5 mg via ORAL
  Filled 2022-07-02: qty 1

## 2022-07-02 MED ORDER — SODIUM CHLORIDE 0.9 % IV SOLN: INTRAVENOUS | Status: DC

## 2022-07-02 MED ORDER — ONDANSETRON HCL 4 MG/2ML IJ SOLN
INTRAMUSCULAR | Status: DC | PRN
  Administered 2022-07-02: 4 mg via INTRAVENOUS

## 2022-07-02 MED ORDER — LIDOCAINE HCL (CARDIAC) PF 100 MG/5ML IV SOSY
PREFILLED_SYRINGE | INTRAVENOUS | Status: DC | PRN
  Administered 2022-07-02: 60 mg via INTRAVENOUS

## 2022-07-02 MED ORDER — KETOROLAC TROMETHAMINE 15 MG/ML IJ SOLN
7.5000 mg | Freq: Four times a day (QID) | INTRAMUSCULAR | Status: DC
  Administered 2022-07-02 – 2022-07-03 (×3): 7.5 mg via INTRAVENOUS
  Filled 2022-07-02 (×3): qty 1

## 2022-07-02 MED ORDER — DEXAMETHASONE SODIUM PHOSPHATE 10 MG/ML IJ SOLN
INTRAMUSCULAR | Status: DC | PRN
  Administered 2022-07-02: 10 mg via INTRAVENOUS

## 2022-07-02 MED ORDER — APIXABAN 2.5 MG PO TABS: 2.5000 mg | ORAL_TABLET | Freq: Two times a day (BID) | ORAL | 0 refills | Status: DC

## 2022-07-02 MED ORDER — ONDANSETRON HCL 4 MG PO TABS: 4.0000 mg | ORAL_TABLET | Freq: Three times a day (TID) | ORAL | 0 refills | Status: AC | PRN

## 2022-07-02 MED ORDER — ORAL CARE MOUTH RINSE: 15.0000 mL | Freq: Once | OROMUCOSAL | Status: AC

## 2022-07-02 MED ORDER — ACETAMINOPHEN 500 MG PO TABS
1000.0000 mg | ORAL_TABLET | Freq: Four times a day (QID) | ORAL | Status: AC
  Administered 2022-07-02 – 2022-07-03 (×4): 1000 mg via ORAL
  Filled 2022-07-02 (×4): qty 2

## 2022-07-02 MED ORDER — FENTANYL CITRATE PF 50 MCG/ML IJ SOSY
100.0000 ug | PREFILLED_SYRINGE | INTRAMUSCULAR | Status: AC
  Administered 2022-07-02: 50 ug via INTRAVENOUS
  Filled 2022-07-02: qty 2

## 2022-07-02 MED ORDER — CEFAZOLIN SODIUM-DEXTROSE 2-4 GM/100ML-% IV SOLN
2.0000 g | INTRAVENOUS | Status: AC
  Administered 2022-07-02: 2 g via INTRAVENOUS
  Filled 2022-07-02: qty 100

## 2022-07-02 MED ORDER — BUPIVACAINE LIPOSOME 1.3 % IJ SUSP: 20.0000 mL | Freq: Once | INTRAMUSCULAR | Status: DC

## 2022-07-02 MED ORDER — MIDAZOLAM HCL 2 MG/2ML IJ SOLN
2.0000 mg | INTRAMUSCULAR | Status: DC
  Filled 2022-07-02: qty 2

## 2022-07-02 MED ORDER — LOSARTAN POTASSIUM 50 MG PO TABS
50.0000 mg | ORAL_TABLET | Freq: Once | ORAL | Status: AC
  Administered 2022-07-02: 50 mg via ORAL
  Filled 2022-07-02 (×2): qty 1

## 2022-07-02 MED ORDER — OXYCODONE HCL 5 MG PO TABS: 5.0000 mg | ORAL_TABLET | ORAL | 0 refills | Status: AC | PRN

## 2022-07-02 MED ORDER — HYDROMORPHONE HCL 1 MG/ML IJ SOLN: 0.5000 mg | INTRAMUSCULAR | Status: DC | PRN

## 2022-07-02 MED ORDER — APIXABAN 5 MG PO TABS: 5.0000 mg | ORAL_TABLET | Freq: Two times a day (BID) | ORAL | Status: DC

## 2022-07-02 MED ORDER — DIPHENHYDRAMINE HCL 12.5 MG/5ML PO ELIX
12.5000 mg | ORAL_SOLUTION | ORAL | Status: DC | PRN
  Administered 2022-07-02: 25 mg via ORAL
  Filled 2022-07-02: qty 10

## 2022-07-02 MED ORDER — SODIUM CHLORIDE (PF) 0.9 % IJ SOLN
INTRAMUSCULAR | Status: AC
  Filled 2022-07-02: qty 10

## 2022-07-02 MED ORDER — FENTANYL CITRATE (PF) 100 MCG/2ML IJ SOLN
INTRAMUSCULAR | Status: DC | PRN
  Administered 2022-07-02: 50 ug via INTRAVENOUS

## 2022-07-02 MED ORDER — GLYCOPYRROLATE 0.2 MG/ML IJ SOLN
INTRAMUSCULAR | Status: AC
  Filled 2022-07-02: qty 1

## 2022-07-02 MED ORDER — OXYCODONE HCL 5 MG/5ML PO SOLN: 5.0000 mg | Freq: Once | ORAL | Status: DC | PRN

## 2022-07-02 MED ORDER — CHLORHEXIDINE GLUCONATE 0.12 % MT SOLN
15.0000 mL | Freq: Once | OROMUCOSAL | Status: AC
  Administered 2022-07-02: 15 mL via OROMUCOSAL

## 2022-07-02 MED ORDER — DEXAMETHASONE SODIUM PHOSPHATE 10 MG/ML IJ SOLN
INTRAMUSCULAR | Status: AC
  Filled 2022-07-02: qty 1

## 2022-07-02 MED ORDER — 0.9 % SODIUM CHLORIDE (POUR BTL) OPTIME
TOPICAL | Status: DC | PRN
  Administered 2022-07-02: 1000 mL

## 2022-07-02 MED ORDER — FLECAINIDE ACETATE 50 MG PO TABS: 50.0000 mg | ORAL_TABLET | Freq: Two times a day (BID) | ORAL | 1 refills | Status: DC

## 2022-07-02 MED ORDER — POLYETHYLENE GLYCOL 3350 17 G PO PACK: 17.0000 g | PACK | Freq: Every day | ORAL | Status: DC | PRN

## 2022-07-02 MED ORDER — FENTANYL CITRATE PF 50 MCG/ML IJ SOSY: 25.0000 ug | PREFILLED_SYRINGE | INTRAMUSCULAR | Status: DC | PRN

## 2022-07-02 MED ORDER — BUPIVACAINE LIPOSOME 1.3 % IJ SUSP
INTRAMUSCULAR | Status: DC | PRN
  Administered 2022-07-02: 20 mL

## 2022-07-02 MED ORDER — FOLIC ACID 1 MG PO TABS
500.0000 ug | ORAL_TABLET | Freq: Every day | ORAL | Status: DC
  Administered 2022-07-03: 0.5 mg via ORAL
  Filled 2022-07-02: qty 1

## 2022-07-02 MED ORDER — LIDOCAINE HCL (PF) 2 % IJ SOLN
INTRAMUSCULAR | Status: AC
  Filled 2022-07-02: qty 5

## 2022-07-02 MED ORDER — METHOCARBAMOL 500 MG PO TABS: 500.0000 mg | ORAL_TABLET | Freq: Four times a day (QID) | ORAL | Status: DC | PRN

## 2022-07-02 MED ORDER — PROPOFOL 10 MG/ML IV BOLUS
INTRAVENOUS | Status: AC
  Filled 2022-07-02: qty 20

## 2022-07-02 MED ORDER — HYDRALAZINE HCL 20 MG/ML IJ SOLN: 10.0000 mg | Freq: Three times a day (TID) | INTRAMUSCULAR | Status: DC | PRN

## 2022-07-02 MED ORDER — WATER FOR IRRIGATION, STERILE IR SOLN
Status: DC | PRN
  Administered 2022-07-02: 2000 mL

## 2022-07-02 MED ORDER — PANTOPRAZOLE SODIUM 40 MG PO TBEC
40.0000 mg | DELAYED_RELEASE_TABLET | Freq: Every day | ORAL | Status: DC
  Administered 2022-07-03: 40 mg via ORAL
  Filled 2022-07-02: qty 1

## 2022-07-02 MED ORDER — POVIDONE-IODINE 10 % EX SWAB: 2.0000 | Freq: Once | CUTANEOUS | Status: DC

## 2022-07-02 MED ORDER — AMLODIPINE BESYLATE 5 MG PO TABS
2.5000 mg | ORAL_TABLET | Freq: Every day | ORAL | Status: DC
  Administered 2022-07-03: 2.5 mg via ORAL
  Filled 2022-07-02: qty 1

## 2022-07-02 MED ORDER — METOPROLOL TARTRATE 5 MG/5ML IV SOLN
INTRAVENOUS | Status: DC | PRN
  Administered 2022-07-02: 2 mg via INTRAVENOUS
  Administered 2022-07-02: 3 mg via INTRAVENOUS

## 2022-07-02 MED ORDER — PREDNISONE 5 MG PO TABS
5.0000 mg | ORAL_TABLET | Freq: Every day | ORAL | Status: DC
  Administered 2022-07-03: 5 mg via ORAL
  Filled 2022-07-02: qty 1

## 2022-07-02 MED ORDER — EPHEDRINE 5 MG/ML INJ
INTRAVENOUS | Status: AC
  Filled 2022-07-02: qty 5

## 2022-07-02 MED ORDER — FENTANYL CITRATE (PF) 100 MCG/2ML IJ SOLN
INTRAMUSCULAR | Status: AC
  Filled 2022-07-02: qty 2

## 2022-07-02 MED ORDER — LOSARTAN POTASSIUM 50 MG PO TABS
50.0000 mg | ORAL_TABLET | Freq: Two times a day (BID) | ORAL | Status: DC
  Administered 2022-07-03: 50 mg via ORAL
  Filled 2022-07-02: qty 1

## 2022-07-02 MED ORDER — PROPOFOL 10 MG/ML IV BOLUS
INTRAVENOUS | Status: DC | PRN
  Administered 2022-07-02: 150 mg via INTRAVENOUS

## 2022-07-02 MED ORDER — ONDANSETRON HCL 4 MG PO TABS: 4.0000 mg | ORAL_TABLET | Freq: Four times a day (QID) | ORAL | Status: DC | PRN

## 2022-07-02 MED ORDER — METHOCARBAMOL 1000 MG/10ML IJ SOLN: 500.0000 mg | Freq: Four times a day (QID) | INTRAVENOUS | Status: DC | PRN

## 2022-07-02 MED ORDER — ISOPROPYL ALCOHOL 70 % SOLN
Status: DC | PRN
  Administered 2022-07-02: 1 via TOPICAL

## 2022-07-02 MED ORDER — PHENOL 1.4 % MT LIQD: 1.0000 | OROMUCOSAL | Status: DC | PRN

## 2022-07-02 MED ORDER — TRANEXAMIC ACID-NACL 1000-0.7 MG/100ML-% IV SOLN
1000.0000 mg | INTRAVENOUS | Status: AC
  Administered 2022-07-02: 1000 mg via INTRAVENOUS
  Filled 2022-07-02: qty 100

## 2022-07-02 MED ORDER — DOCUSATE SODIUM 100 MG PO CAPS
100.0000 mg | ORAL_CAPSULE | Freq: Two times a day (BID) | ORAL | Status: DC
  Administered 2022-07-02 – 2022-07-03 (×2): 100 mg via ORAL
  Filled 2022-07-02 (×2): qty 1

## 2022-07-02 MED ORDER — OXYCODONE HCL 5 MG PO TABS: 5.0000 mg | ORAL_TABLET | ORAL | Status: DC | PRN

## 2022-07-02 MED ORDER — OXYCODONE HCL 5 MG PO TABS: 5.0000 mg | ORAL_TABLET | Freq: Once | ORAL | Status: DC | PRN

## 2022-07-02 MED ORDER — APIXABAN 5 MG PO TABS: 5.0000 mg | ORAL_TABLET | Freq: Two times a day (BID) | ORAL | 0 refills | Status: AC

## 2022-07-02 MED ORDER — ONDANSETRON HCL 4 MG/2ML IJ SOLN
INTRAMUSCULAR | Status: AC
  Filled 2022-07-02: qty 2

## 2022-07-02 MED ORDER — ACETAMINOPHEN 500 MG PO TABS: 1000.0000 mg | ORAL_TABLET | Freq: Three times a day (TID) | ORAL | 0 refills | Status: AC | PRN

## 2022-07-02 MED ORDER — SODIUM CHLORIDE 0.9 % IR SOLN
Status: DC | PRN
  Administered 2022-07-02: 3000 mL

## 2022-07-02 MED ORDER — METHOCARBAMOL 500 MG PO TABS: 500.0000 mg | ORAL_TABLET | Freq: Three times a day (TID) | ORAL | 0 refills | Status: AC | PRN

## 2022-07-02 MED ORDER — EPHEDRINE SULFATE (PRESSORS) 50 MG/ML IJ SOLN
INTRAMUSCULAR | Status: DC | PRN
  Administered 2022-07-02: 10 mg via INTRAVENOUS
  Administered 2022-07-02: 5 mg via INTRAVENOUS

## 2022-07-02 MED ORDER — BUPIVACAINE LIPOSOME 1.3 % IJ SUSP
INTRAMUSCULAR | Status: AC
  Filled 2022-07-02: qty 20

## 2022-07-02 MED ORDER — ACETAMINOPHEN 325 MG PO TABS: 325.0000 mg | ORAL_TABLET | Freq: Four times a day (QID) | ORAL | Status: DC | PRN

## 2022-07-02 MED ORDER — MENTHOL 3 MG MT LOZG: 1.0000 | LOZENGE | OROMUCOSAL | Status: DC | PRN

## 2022-07-02 MED ORDER — HYDROMORPHONE HCL 1 MG/ML IJ SOLN
INTRAMUSCULAR | Status: DC | PRN
  Administered 2022-07-02 (×3): .5 mg via INTRAVENOUS

## 2022-07-02 MED ORDER — ONDANSETRON HCL 4 MG/2ML IJ SOLN: 4.0000 mg | Freq: Four times a day (QID) | INTRAMUSCULAR | Status: DC | PRN

## 2022-07-02 MED ORDER — APIXABAN 2.5 MG PO TABS: 2.5000 mg | ORAL_TABLET | Freq: Two times a day (BID) | ORAL | Status: DC

## 2022-07-02 MED ORDER — ACETAMINOPHEN 500 MG PO TABS
1000.0000 mg | ORAL_TABLET | Freq: Once | ORAL | Status: AC
  Administered 2022-07-02: 1000 mg via ORAL
  Filled 2022-07-02: qty 2

## 2022-07-02 SURGICAL SUPPLY — 62 items
ADH SKN CLS APL DERMABOND .7 (GAUZE/BANDAGES/DRESSINGS) ×2
APL PRP STRL LF DISP 70% ISPRP (MISCELLANEOUS) ×2
BLADE SAG 18X100X1.27 (BLADE) ×1 IMPLANT
BLADE SAW SAG 35X64 .89 (BLADE) ×1 IMPLANT
BNDG CMPR 5X3 CHSV STRCH STRL (GAUZE/BANDAGES/DRESSINGS) ×1
BNDG CMPR 5X62 HK CLSR LF (GAUZE/BANDAGES/DRESSINGS) ×1
BNDG COHESIVE 3X5 TAN ST LF (GAUZE/BANDAGES/DRESSINGS) ×1 IMPLANT
BNDG ELASTIC 6INX 5YD STR LF (GAUZE/BANDAGES/DRESSINGS) IMPLANT
BOWL SMART MIX CTS (DISPOSABLE) ×1 IMPLANT
BSPLAT TIB 5D G CMNT STM RT (Knees) ×1 IMPLANT
CEMENT BONE R 1X40 (Cement) IMPLANT
CEMENT BONE REFOBACIN R1X40 US (Cement) IMPLANT
CHLORAPREP W/TINT 26 (MISCELLANEOUS) ×2 IMPLANT
COMP FEM CMT PERSONA SZ9 RT (Joint) ×1 IMPLANT
COMP PATELLAR 35 STD 9 THK (Orthopedic Implant) IMPLANT
COMPONENT FEM CMT PRSONA SZ9RT (Joint) IMPLANT
COVER SURGICAL LIGHT HANDLE (MISCELLANEOUS) ×1 IMPLANT
CUFF TOURN SGL QUICK 34 (TOURNIQUET CUFF) ×1
CUFF TRNQT CYL 34X4.125X (TOURNIQUET CUFF) ×1 IMPLANT
DERMABOND ADVANCED .7 DNX12 (GAUZE/BANDAGES/DRESSINGS) ×1 IMPLANT
DRAPE INCISE IOBAN 85X60 (DRAPES) ×1 IMPLANT
DRAPE SHEET LG 3/4 BI-LAMINATE (DRAPES) ×1 IMPLANT
DRAPE U-SHAPE 47X51 STRL (DRAPES) ×1 IMPLANT
DRSG AQUACEL AG ADV 3.5X10 (GAUZE/BANDAGES/DRESSINGS) IMPLANT
ELECT REM PT RETURN 15FT ADLT (MISCELLANEOUS) ×1 IMPLANT
GAUZE SPONGE 4X4 12PLY STRL (GAUZE/BANDAGES/DRESSINGS) ×1 IMPLANT
GLOVE BIO SURGEON STRL SZ 6.5 (GLOVE) ×2 IMPLANT
GLOVE BIOGEL PI IND STRL 6.5 (GLOVE) ×1 IMPLANT
GLOVE BIOGEL PI IND STRL 8 (GLOVE) ×1 IMPLANT
GLOVE SURG ORTHO 8.0 STRL STRW (GLOVE) ×2 IMPLANT
GOWN STRL REUS W/ TWL XL LVL3 (GOWN DISPOSABLE) ×2 IMPLANT
GOWN STRL REUS W/TWL XL LVL3 (GOWN DISPOSABLE) ×2
HANDPIECE INTERPULSE COAX TIP (DISPOSABLE) ×1
HDLS TROCR DRIL PIN KNEE 75 (PIN) ×1
HOOD PEEL AWAY T7 (MISCELLANEOUS) ×3 IMPLANT
INSERT TIBIAL PERSONA 10 RT (Insert) IMPLANT
KIT TURNOVER KIT A (KITS) IMPLANT
MANIFOLD NEPTUNE II (INSTRUMENTS) ×1 IMPLANT
MARKER SKIN DUAL TIP RULER LAB (MISCELLANEOUS) ×1 IMPLANT
NS IRRIG 1000ML POUR BTL (IV SOLUTION) ×1 IMPLANT
PACK TOTAL KNEE CUSTOM (KITS) ×1 IMPLANT
PATELLA ZIMMER 35MM (Orthopedic Implant) ×1 IMPLANT
PIN DRILL HDLS TROCAR 75 4PK (PIN) IMPLANT
SCREW HEADED 33MM KNEE (MISCELLANEOUS) IMPLANT
SET HNDPC FAN SPRY TIP SCT (DISPOSABLE) ×1 IMPLANT
SOLUTION IRRIG SURGIPHOR (IV SOLUTION) IMPLANT
STEM TIB ST PERS 14+30 (Stem) IMPLANT
STEM TIBIA 5 DEG SZ G R KNEE (Knees) IMPLANT
STRIP CLOSURE SKIN 1/2X4 (GAUZE/BANDAGES/DRESSINGS) ×1 IMPLANT
SUT MNCRL AB 3-0 PS2 18 (SUTURE) ×1 IMPLANT
SUT STRATAFIX 0 PDS 27 VIOLET (SUTURE) ×1
SUT STRATAFIX PDO 1 14 VIOLET (SUTURE) ×1
SUT STRATFX PDO 1 14 VIOLET (SUTURE) ×1
SUT VIC AB 2-0 CT2 27 (SUTURE) ×2 IMPLANT
SUTURE STRATFX 0 PDS 27 VIOLET (SUTURE) ×1 IMPLANT
SUTURE STRATFX PDO 1 14 VIOLET (SUTURE) ×1 IMPLANT
SYR 50ML LL SCALE MARK (SYRINGE) ×1 IMPLANT
TIBIA STEM 5 DEG SZ G R KNEE (Knees) ×1 IMPLANT
TIBIAL INSERT PERSONA 10 RT (Insert) ×1 IMPLANT
TUBE SUCTION HIGH CAP CLEAR NV (SUCTIONS) ×1 IMPLANT
UNDERPAD 30X36 HEAVY ABSORB (UNDERPADS AND DIAPERS) ×1 IMPLANT
WRAP KNEE MAXI GEL POST OP (GAUZE/BANDAGES/DRESSINGS) IMPLANT

## 2022-07-02 NOTE — Anesthesia Procedure Notes (Signed)
Procedure Name: LMA Insertion Date/Time: 07/02/2022 10:31 AM  Performed by: Randye Lobo, CRNAPre-anesthesia Checklist: Patient identified, Emergency Drugs available, Suction available and Patient being monitored Patient Re-evaluated:Patient Re-evaluated prior to induction Oxygen Delivery Method: Circle System Utilized Preoxygenation: Pre-oxygenation with 100% oxygen Induction Type: IV induction Ventilation: Mask ventilation without difficulty LMA: LMA inserted LMA Size: 4.0 Number of attempts: 1 Airway Equipment and Method: Bite block Placement Confirmation: positive ETCO2 Tube secured with: Tape Dental Injury: Teeth and Oropharynx as per pre-operative assessment

## 2022-07-02 NOTE — Progress Notes (Signed)
Dr. Zachery Dakins notified of Pt elevated BP 194/82. Order received for one time dose of PO losartan 50 mg. Medication given as ordered. Will continue to monitor pt BP.

## 2022-07-02 NOTE — Interval H&P Note (Signed)
The patient has been re-examined, and the chart reviewed, and there have been no interval changes to the documented history and physical.    Plan for R TKA for R knee OA  The operative side was examined and the patient was confirmed to have sensation to DPN, SPN, TN intact, Motor EHL, ext, flex 5/5, and DP 2+, PT 2+, No significant edema.   The risks, benefits, and alternatives have been discussed at length with patient, and the patient is willing to proceed.  R knee marked. Consent has been signed.  

## 2022-07-02 NOTE — Op Note (Signed)
DATE OF SURGERY:  07/02/2022 TIME: 12:18 PM  PATIENT NAME:  Jonathan Woodward   AGE: 89 y.o.    PRE-OPERATIVE DIAGNOSIS:  End stage right knee osteoarthritis  POST-OPERATIVE DIAGNOSIS:  Same  PROCEDURE:  right Total Knee Arthroplasty  SURGEON:  Caeleb Batalla A Hezekiah Veltre, MD   ASSISTANT:  Dorise Bullion, PA-C, present and scrubbed throughout the case, critical for assistance with exposure, retraction, instrumentation, and closure.   OPERATIVE IMPLANTS: Zimmer persona cemented 9 standard femur, G tibia baseplate cmeented with [id number] stem extension, 68mm poly MC insert, 51mm patella cemented  Implant Name Type Inv. Item Serial No. Manufacturer Lot No. LRB No. Used Action  CEMENT BONE R 1X40 - [id number] Cement CEMENT BONE R 1X40  ZIMMER RECON(ORTH,TRAU,BIO,SG) [id number] Right 2 Implanted  TIBIA STEM 5 DEG SZ G R KNEE - [id number] Knees TIBIA STEM 5 DEG SZ G R KNEE  ZIMMER RECON(ORTH,TRAU,BIO,SG) [id number] Right 1 Implanted  STEM TIB ST PERS 14+30 - [id number] Stem STEM TIB ST PERS 14+30  ZIMMER RECON(ORTH,TRAU,BIO,SG) [id number] Right 1 Implanted  PATELLA ZIMMER 35MM - [id number] Orthopedic Implant PATELLA ZIMMER 35MM  ZIMMER RECON(ORTH,TRAU,BIO,SG) [id number] Right 1 Implanted  COMP FEM CMT PERSONA SZ9 RT - [id number] Joint COMP FEM CMT PERSONA SZ9 RT  ZIMMER RECON(ORTH,TRAU,BIO,SG) [id number] Right 1 Implanted  TIBIAL INSERT PERSONA 10 RT - [id number] Insert TIBIAL INSERT PERSONA 10 RT  ZIMMER RECON(ORTH,TRAU,BIO,SG) [id number] Right 1 Implanted      PREOPERATIVE INDICATIONS:  Jonathan Woodward is a 89 y.o. year old male with end stage bone on bone degenerative arthritis of the knee who failed conservative treatment, including injections, antiinflammatories, activity modification, and assistive devices, and had significant impairment of their activities of daily living, and elected for Total Knee Arthroplasty.   The risks, benefits, and alternatives were discussed at length including but not  limited to the risks of infection, bleeding, nerve injury, stiffness, blood clots, the need for revision surgery, cardiopulmonary complications, among others, and they were willing to proceed.   ESTIMATED BLOOD LOSS: 50cc  OPERATIVE DESCRIPTION:   Once adequate anesthesia was induced, preoperative antibiotics, 2 gm of ancef,1 gm of Tranexamic Acid, and 8 mg of Decadron administered, the patient was positioned supine with a right thigh tourniquet placed.  The right lower extremity was prepped and draped in sterile fashion.  A time-  out was performed identifying the patient, planned procedure, and the appropriate extremity.     The leg was  exsanguinated, tourniquet elevated to 250 mmHg.  A midline incision was  made followed by median parapatellar arthrotomy. Anterior horn of the medial meniscus was released and resected. A medial release was performed, the infrapatellar fat pad was resected with care taken to protect the patellar tendon. The suprapatellar fat was removed to exposed the distal anterior femur. The anterior horn of the lateral meniscus and ACL were released.    Following initial  exposure, I first started with the femur  The femoral  canal was opened with a drill, canal was suctioned to try to prevent fat emboli.  An  intramedullary rod was passed set at 5 degrees valgus, 10 mm. The distal femur was resected.  Following this resection, the tibia was  subluxated anteriorly.  Using the extramedullary guide, 10 mm of bone was resected off   the proximal lateral tibia.  We confirmed the gap would be  stable medially and laterally with a size 29mm spacer block as well as confirmed that the tibial cut was perpendicular in the coronal plane, checking  with an alignment rod.    Once this was done, the posterior femoral referencing femoral sizer was placed under to the posterior condyles with 5 degrees of external rotational which was parallel to the transepicondylar axis and perpendicular to  Jonathan Woodward. The femur was sized to be a size 9 in the anterior-  posterior dimension. The  anterior, posterior, and  chamfer cuts were made without difficulty nor   notching making certain that I was along the anterior cortex to help  with flexion gap stability. Next a laminar spreader was placed with the knee in flexion and the medial lateral menisci were resected.  5 cc of the Exparel mixture was injected in the medial side of the back of the knee and 3 cc in the lateral side.  1/2 inch curved osteotome was used to resect posterior osteophyte that was then removed with a pituitary rongeur.       At this point, the tibia was sized to be a size G.  The size G tray was  then pinned in position. Trial reduction was now carried with a 9 femur, G tibia, a 60mm MC insert.  The knee had full extension and was stable to varus valgus stress in extension.  The knee was slightly tight in flexion and the PCL was partially released.   Attention was next directed to the patella.  Precut  measurement was noted to be 25 mm.  I resected down to 15 mm and used a  15 patellar button to restore patellar height as well as cover the cut surface.     The patella lug holes were drilled and a 35 mm patella poly trial was placed.    The knee was brought to full extension with good flexion stability with the patella tracking through the trochlea without application of pressure.     Next the femoral component was again assessed and determined to be seated and appropriately lateralized.  The femoral lug holes were drilled.  The femoral component was then removed. Tibial component was again assessed and felt to be seated and appropriately rotated with the medial third of the tubercle. The tibia was then drilled, and keel punched.     Final components were  opened and cement was mixed.      Final implants were then  cemented onto cleaned and dried cut surfaces of bone with the knee brought to extension with a 10 mm MC poly.   The knee was irrigated with sterile Betadine diluted in saline as well as pulse lavage normal saline.  The synovial lining was  then injected a dilute Exparel.      Once the cement had fully cured, excess cement was removed throughout the knee.  I confirmed that I was satisfied with the range of motion and stability, and the final 32mm MC poly insert was chosen.  It was placed into the knee.         The tourniquet had been let down.  No significant hemostasis was required.  The medial parapatellar arthrotomy was then reapproximated using #1 Stratafix sutures with the knee  in flexion.  The remaining wound was closed with 0 stratafix, 2-0 Vicryl, and running 3-0 Monocryl. The knee was cleaned, dried, dressed sterilely using Dermabond and   Aquacel dressing.  The patient was then brought to recovery room in stable condition, tolerating the procedure  well. There were no complications.  Post op recs: WB: WBAT Abx: ancef Imaging: PACU xrays DVT prophylaxis: Eliquis 2.5  mg postop day 1 and 2, resume Eliquis 5 mg twice daily starting postop day 3 Follow up: 2 weeks after surgery for a wound check with Dr. Zachery Dakins at [facility].  Address: [address], [place], [place] [address]  Office Phone: [phone]    Charlies Constable, MD Orthopaedic Surgery

## 2022-07-02 NOTE — Discharge Instructions (Signed)

## 2022-07-02 NOTE — TOC Transition Note (Signed)
Transition of Care [facility]) - CM/SW Discharge Note  Patient Details  Name: Jonathan Woodward MRN: [id number] Date of Birth: [birth year]  Transition of Care [facility]) CM/SW Contact:  Sherie Don, LCSW Phone Number: 07/02/2022, 3:58 PM  Clinical Narrative: Patient is expected to discharge home after working with PT. CSW met with patient to confirm discharge plan. Patient will go home with HHPT, which was prearranged with [place]. Patient has a rolling walker at home, so there are no DME needs at this time. TOC signing off.    Final next level of care: Home w Home Health Services Barriers to Discharge: No Barriers Identified  Patient Goals and CMS Choice CMS Medicare.gov Compare Post Acute Care list provided to:: Patient Choice offered to / list presented to : Patient  Discharge Plan and Services Additional resources added to the After Visit Summary for         DME Arranged: N/A DME Agency: NA HH Arranged: PT HH Agency: Magazine features editor spoke with at [place] in orthopedist's office  Social Determinants of Health ([place]) Interventions [place]: No Food Insecurity (03/03/2022)  Housing: Low Risk  (03/03/2022)  Transportation Needs: No Transportation Needs (03/03/2022)  Utilities: Not At Risk (03/03/2022)  Tobacco Use: Medium Risk (07/02/2022)   Readmission Risk Interventions     No data to display

## 2022-07-02 NOTE — Progress Notes (Signed)
Orthopedic Tech Progress Note Patient Details:  Jonathan Woodward [birth year] [id number] Bone Foam was left at bedside in PACU. Ortho Devices Type of Ortho Device: Bone foam zero knee Ortho Device/Splint Interventions: Ordered      Gracianna Vink E Xela Oregel 07/02/2022, 1:28 PM

## 2022-07-02 NOTE — Plan of Care (Signed)

## 2022-07-02 NOTE — Anesthesia Procedure Notes (Signed)
Anesthesia Regional Block: Adductor canal block   Pre-Anesthetic Checklist: , timeout performed,  Correct Patient, Correct Site, Correct Laterality,  Correct Procedure, Correct Position, site marked,  Risks and benefits discussed,  Surgical consent,  Pre-op evaluation,  At surgeon's request and post-op pain management  Laterality: Right  Prep: chloraprep       Needles:  Injection technique: Single-shot  Needle Type: Echogenic Needle     Needle Length: 9cm  Needle Gauge: 21     Additional Needles:   Narrative:  Start time: 07/02/2022 9:50 AM End time: 07/02/2022 9:58 AM Injection made incrementally with aspirations every 5 mL.  Performed by: Personally  Anesthesiologist: Albertha Ghee, MD  Additional Notes: Pt tolerated the procedure well.

## 2022-07-02 NOTE — Evaluation (Signed)
Physical Therapy Evaluation Patient Details Name: Jonathan Woodward MRN: [id number] DOB: 02-19-1933 Today's Date: 07/02/2022  History of Present Illness  89 yo male presents to therapy s/p R TKA on 07/02/2022 due to failure of conservative measures. 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  Clinical Impression    Khaliel Lenn is a 89 y.o. male POD 0 s/p R TKA. Patient reports IND with mobility at baseline. Patient is now limited by functional impairments (see PT problem list below) and requires S for bed mobility and min guard for transfers. Patient was able to ambulate 88 feet with RW and min guard and progressing to close S  level of assist. Patient instructed in exercise to facilitate ROM and circulation to manage edema. Patient will benefit from continued skilled PT interventions to address impairments and progress towards PLOF. Acute PT will follow to progress mobility and stair training in preparation for safe discharge home.      Recommendations for follow up therapy are one component of a multi-disciplinary discharge planning process, led by the attending physician.  Recommendations may be updated based on patient status, additional functional criteria and insurance authorization.  Follow Up Recommendations Home health PT      Assistance Recommended at Discharge Intermittent Supervision/Assistance  Patient can return home with the following  A little help with walking and/or transfers;A little help with bathing/dressing/bathroom;Assistance with cooking/housework;Assist for transportation;Help with stairs or ramp for entrance    Equipment Recommendations None recommended by PT (pt has DME in home setting)  Recommendations for Other Services       Functional Status Assessment Patient has had a recent decline in their functional status and demonstrates the ability to make significant improvements in function in a  reasonable and predictable amount of time.     Precautions / Restrictions Precautions Precautions: Knee;Fall Restrictions Weight Bearing Restrictions: No      Mobility  Bed Mobility Overal bed mobility: Needs Assistance Bed Mobility: Supine to Sit     Supine to sit: Supervision     General bed mobility comments: HOB elevated    Transfers Overall transfer level: Needs assistance Equipment used: Rolling walker (2 wheels) Transfers: Sit to/from Stand Sit to Stand: Min guard           General transfer comment: cues for proper UE and AD placement    Ambulation/Gait Ambulation/Gait assistance: Min guard (progressing to close S) Gait Distance (Feet): 88 Feet Assistive device: Rolling walker (2 wheels) Gait Pattern/deviations: Step-through pattern       General Gait Details: pt demonstrating good reciprocal pattern LEs passing in stance phase and B foot clearance with quick cadence  Stairs            Wheelchair Mobility    Modified Rankin (Stroke Patients Only)       Balance Overall balance assessment: Needs assistance Sitting-balance support: Feet supported Sitting balance-Leahy Scale: Good     Standing balance support: No upper extremity supported (with static standing and requries B UE support at [facility] for dynamic) Standing balance-Leahy Scale: Fair                               Pertinent Vitals/Pain Pain Assessment Pain Assessment: No/denies pain    Home Living Family/patient expects to be discharged to:: Private residence Living Arrangements: Spouse/significant other Available Help at Discharge: Family Type of Home: House Home Access: Stairs  to enter Entrance Stairs-Rails: Left Entrance Stairs-Number of Steps: 3   Home Layout: One level Home Equipment: Agricultural consultant (2 wheels);Toilet riser      Prior Function Prior Level of Function : Independent/Modified Independent;History of Falls (last six months)              Mobility Comments: IND with all ADLs, self care tasks, IADLs and driving       Hand Dominance        Extremity/Trunk Assessment        Lower Extremity Assessment Lower Extremity Assessment: RLE deficits/detail RLE Deficits / Details: ankle DF/PF 5/5 SLT < 10 degree lag RLE Sensation: WNL       Communication   Communication: HOH  Cognition Arousal/Alertness: Awake/alert Behavior During Therapy: WFL for tasks assessed/performed Overall Cognitive Status: Within Functional Limits for tasks assessed                                          General Comments      Exercises Total Joint Exercises Ankle Circles/Pumps: AROM, Both, 20 reps Quad Sets: AROM, Right, 5 reps Heel Slides: AROM, Right, 5 reps Hip ABduction/ADduction: AROM, Right, 5 reps Straight Leg Raises: AROM, Right, 5 reps Long Arc Quad: AROM, Right, 5 reps   Assessment/Plan    PT Assessment Patient needs continued PT services  PT Problem List Decreased strength;Decreased range of motion;Decreased activity tolerance;Decreased balance;Decreased mobility       PT Treatment Interventions DME instruction;Gait training;Stair training;Functional mobility training;Therapeutic activities;Therapeutic exercise;Balance training;Neuromuscular re-education;Patient/family education;Modalities    PT Goals (Current goals can be found in the Care Plan section)  Acute Rehab PT Goals Patient Stated Goal: to be able to get back to planting this spring PT Goal Formulation: With patient Time For Goal Achievement: 07/16/22 Potential to Achieve Goals: Good    Frequency 7X/week     Co-evaluation               AM-PAC PT "6 Clicks" Mobility  Outcome Measure Help needed turning from your back to your side while in a flat bed without using bedrails?: None Help needed moving from lying on your back to sitting on the side of a flat bed without using bedrails?: A Little Help needed moving to and from a bed  to a chair (including a wheelchair)?: A Little Help needed standing up from a chair using your arms (e.g., wheelchair or bedside chair)?: A Little Help needed to walk in hospital room?: A Little Help needed climbing 3-5 steps with a railing? : Total 6 Click Score: 17    End of Session Equipment Utilized During Treatment: Gait belt Activity Tolerance: Patient tolerated treatment well;No increased pain Patient left: in chair;with call bell/phone within reach;with family/visitor present Nurse Communication: Mobility status PT Visit Diagnosis: Unsteadiness on feet (R26.81);Other abnormalities of gait and mobility (R26.89);Muscle weakness (generalized) (M62.81)    Time: 8119-1478 PT Time Calculation (min) (ACUTE ONLY): 29 min   Charges:   PT Evaluation $PT Eval Low Complexity: 1 Low PT Treatments $Gait Training: 8-22 mins        Rica Mote, PT   Jacqualyn Posey 07/02/2022, 6:11 PM

## 2022-07-02 NOTE — Transfer of Care (Signed)
Immediate Anesthesia Transfer of Care Note  Patient: Jonathan Woodward  Procedure(s) Performed: TOTAL KNEE ARTHROPLASTY (Right: Knee)  Patient Location: PACU  Anesthesia Type:GA combined with regional for post-op pain  Level of Consciousness: awake, alert , oriented, patient cooperative, and responds to stimulation  Airway & Oxygen Therapy: Patient Spontanous Breathing and Patient connected to face mask oxygen  Post-op Assessment: Report given to RN, Post -op Vital signs reviewed and stable, and Patient moving all extremities  Post vital signs: Reviewed and stable  Last Vitals:  Vitals Value Taken Time  BP 177/84 07/02/22 1306  Temp    Pulse 66 07/02/22 1309  Resp 18 07/02/22 1309  SpO2 96 % 07/02/22 1309  Vitals shown include unvalidated device data.  Last Pain:  Vitals:   07/02/22 1010  TempSrc:   PainSc: Asleep         Complications: No notable events documented.

## 2022-07-03 DIAGNOSIS — M1711 Unilateral primary osteoarthritis, right knee: Secondary | ICD-10-CM | POA: Diagnosis not present

## 2022-07-03 LAB — CBC
HCT: 31.6 % — ABNORMAL LOW (ref 39.0–52.0)
Hemoglobin: 10.3 g/dL — ABNORMAL LOW (ref 13.0–17.0)
MCH: 29.5 pg (ref 26.0–34.0)
MCHC: 32.6 g/dL (ref 30.0–36.0)
MCV: 90.5 fL (ref 80.0–100.0)
Platelets: 147 10*3/uL — ABNORMAL LOW (ref 150–400)
RBC: 3.49 MIL/uL — ABNORMAL LOW (ref 4.22–5.81)
RDW: 16.2 % — ABNORMAL HIGH (ref 11.5–15.5)
WBC: 8.3 10*3/uL (ref 4.0–10.5)
nRBC: 0 % (ref 0.0–0.2)

## 2022-07-03 LAB — BASIC METABOLIC PANEL
Anion gap: 6 (ref 5–15)
BUN: 20 mg/dL (ref 8–23)
CO2: 28 mmol/L (ref 22–32)
Calcium: 8.4 mg/dL — ABNORMAL LOW (ref 8.9–10.3)
Chloride: 99 mmol/L (ref 98–111)
Creatinine, Ser: 0.9 mg/dL (ref 0.61–1.24)
GFR, Estimated: >60 mL/min (ref >60)
Glucose, Bld: 134 mg/dL — ABNORMAL HIGH (ref 70–99)
Potassium: 4.2 mmol/L (ref 3.5–5.1)
Sodium: 133 mmol/L — ABNORMAL LOW (ref 135–145)

## 2022-07-03 NOTE — Progress Notes (Signed)
Physical Therapy Treatment Patient Details Name: Jonathan Woodward MRN: [id number] DOB: 10-13-1932 Today's Date: 07/03/2022   History of Present Illness 89 yo male presents to therapy s/p R TKA on 07/02/2022 due to failure of conservative measures. 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    PT Comments    Pt motivated and progressing well with mobility.  Pt up to ambulate increased distance in hall, negotiated stairs and initiated HEP with written instruction provided and reviewed.  Pt eager for dc home this date.   Recommendations for follow up therapy are one component of a multi-disciplinary discharge planning process, led by the attending physician.  Recommendations may be updated based on patient status, additional functional criteria and insurance authorization.  Follow Up Recommendations  Home health PT     Assistance Recommended at Discharge Intermittent Supervision/Assistance  Patient can return home with the following A little help with walking and/or transfers;A little help with bathing/dressing/bathroom;Assistance with cooking/housework;Assist for transportation;Help with stairs or ramp for entrance   Equipment Recommendations  None recommended by PT    Recommendations for Other Services       Precautions / Restrictions Precautions Precautions: Knee;Fall Restrictions Weight Bearing Restrictions: No     Mobility  Bed Mobility Overal bed mobility: Modified Independent Bed Mobility: Supine to Sit, Sit to Supine     Supine to sit: Modified independent (Device/Increase time) Sit to supine: Modified independent (Device/Increase time)   General bed mobility comments: no physical assist    Transfers Overall transfer level: Needs assistance Equipment used: Rolling walker (2 wheels) Transfers: Sit to/from Stand Sit to Stand: Supervision           General transfer comment: cues for proper UE and  AD placement    Ambulation/Gait Ambulation/Gait assistance: Min guard, Supervision Gait Distance (Feet): 250 Feet Assistive device: Rolling walker (2 wheels) Gait Pattern/deviations: Step-through pattern       General Gait Details: pt demonstrating good reciprocal pattern LEs passing in stance phase and B foot clearance with quick cadence   Stairs Stairs: Yes Stairs assistance: Min guard Stair Management: One rail Left, Alternating pattern, Forwards Number of Stairs: 2 General stair comments: min cues for sequence   Wheelchair Mobility    Modified Rankin (Stroke Patients Only)       Balance Overall balance assessment: Needs assistance Sitting-balance support: Feet supported Sitting balance-Leahy Scale: Good     Standing balance support: No upper extremity supported Standing balance-Leahy Scale: Fair                              Cognition Arousal/Alertness: Awake/alert Behavior During Therapy: WFL for tasks assessed/performed Overall Cognitive Status: Within Functional Limits for tasks assessed                                          Exercises Total Joint Exercises Ankle Circles/Pumps: AROM, Both, 20 reps Quad Sets: AROM, Right, 10 reps, Supine Heel Slides: AROM, Right, 15 reps, Supine Straight Leg Raises: AROM, Right, 15 reps, Supine Long Arc Quad: AROM, Right, 10 reps, Seated Goniometric ROM: -5 - 105 AAROM L knee    General Comments        Pertinent Vitals/Pain Pain Assessment Pain Assessment: No/denies pain    Home Living  Prior Function            PT Goals (current goals can now be found in the care plan section) Acute Rehab PT Goals Patient Stated Goal: to be able to get back to planting this spring PT Goal Formulation: With patient Time For Goal Achievement: 07/16/22 Potential to Achieve Goals: Good Progress towards PT goals: Progressing toward goals    Frequency     7X/week      PT Plan Current plan remains appropriate    Co-evaluation              AM-PAC PT "6 Clicks" Mobility   Outcome Measure  Help needed turning from your back to your side while in a flat bed without using bedrails?: None Help needed moving from lying on your back to sitting on the side of a flat bed without using bedrails?: A Little Help needed moving to and from a bed to a chair (including a wheelchair)?: A Little Help needed standing up from a chair using your arms (e.g., wheelchair or bedside chair)?: A Little Help needed to walk in hospital room?: A Little Help needed climbing 3-5 steps with a railing? : A Little 6 Click Score: 19    End of Session Equipment Utilized During Treatment: Gait belt Activity Tolerance: Patient tolerated treatment well;No increased pain Patient left: in chair;with call bell/phone within reach;with family/visitor present Nurse Communication: Mobility status PT Visit Diagnosis: Unsteadiness on feet (R26.81);Other abnormalities of gait and mobility (R26.89);Muscle weakness (generalized) (M62.81)     Time: 4098-1191 PT Time Calculation (min) (ACUTE ONLY): 32 min  Charges:  $Gait Training: 8-22 mins $Therapeutic Exercise: 8-22 mins                     Mauro Kaufmann PT Acute Rehabilitation Services Pager [phone] Office [phone]    Cowan Pilar 07/03/2022, 9:05 AM

## 2022-07-03 NOTE — Progress Notes (Signed)
    Subjective: Patient reports pain as mild to moderate.  Tolerating diet.  Urinating.   No CP, SOB.  Has mobilized well OOB with PT/OT. Ready to go home.  Objective:   VITALS:   Vitals:   07/02/22 2041 07/03/22 0140 07/03/22 0511 07/03/22 1034  BP: (!) 171/65 (!) 188/79 (!) 167/69 (!) 166/66  Pulse: 63 70 81 74  Resp: 18 18 20 15   Temp: 98 F (36.7 C) 97.7 F (36.5 C) 98.3 F (36.8 C) 98.1 F (36.7 C)  TempSrc:    Oral  SpO2: 94% 93% 95% 97%  Weight:      Height:          Latest Ref Rng & Units 07/03/2022    3:39 AM 06/22/2022   10:44 AM 03/04/2022    3:36 AM  CBC  WBC 4.0 - 10.5 K/uL 8.3  5.4  7.3   Hemoglobin 13.0 - 17.0 g/dL 10.3  12.3  8.9   Hematocrit 39.0 - 52.0 % 31.6  39.0  27.6   Platelets 150 - 400 K/uL 147  152  164       Latest Ref Rng & Units 07/03/2022    3:39 AM 06/22/2022   10:44 AM 03/04/2022    3:36 AM  BMP  Glucose 70 - 99 mg/dL 134  81  128   BUN 8 - 23 mg/dL 20  13  15    Creatinine 0.61 - 1.24 mg/dL 0.90  0.82  0.81   Sodium 135 - 145 mmol/L 133  133  137   Potassium 3.5 - 5.1 mmol/L 4.2  4.0  4.0   Chloride 98 - 111 mmol/L 99  100  105   CO2 22 - 32 mmol/L 28  24  28    Calcium 8.9 - 10.3 mg/dL 8.4  8.9  8.5    Intake/Output      03/22 0701 03/23 0700 03/23 0701 03/24 0700   P.O. 820 240   I.V. (mL/kg) 1864.8 (24.2) 0 (0)   IV Piggyback 300    Total Intake(mL/kg) 2984.8 (38.7) 240 (3.1)   Urine (mL/kg/hr) 200    Blood 50    Total Output 250    Net +2734.8 +240        Urine Occurrence 3 x 1 x   Stool Occurrence 0 x       Physical Exam: General: NAD.  Sitting up in bedside chair, calm, comfortable Resp: No increased wob Cardio: regular rate and rhythm ABD soft Neurologically intact MSK Neurovascularly intact Sensation intact distally Intact pulses distally Dorsiflexion/Plantar flexion intact Incision: dressing C/D/I   Assessment: 1 Day Post-Op  S/P Procedure(s) (LRB): TOTAL KNEE ARTHROPLASTY (Right) by Dr.  Zachery Dakins on 07/02/22  Principal Problem:   Primary osteoarthritis of right knee   Plan:  Advance diet Up with therapy Incentive Spirometry Elevate and Apply ice  Weightbearing: WBAT RLE Insicional and dressing care: Dressings left intact until follow-up and Reinforce dressings as needed Orthopedic device(s): None Showering: Keep dressing dry VTE prophylaxis:  resume home Eliquis  , SCDs, ambulation Pain control: continue planned regimen Follow - up plan: 2 weeks Contact information:  Edmonia Lynch MD, Aggie Moats PA-C  Dispo: Home today with HHPT already set up.     Britt Bottom, PA-C Office [phone] 07/03/2022, 11:18 AM

## 2022-07-03 NOTE — Plan of Care (Signed)

## 2022-07-03 NOTE — Progress Notes (Signed)
Pt alert and oriented. Surgical dressing clean, dry and intact. No questions or queries regarding discharge instructions. Belongings sent home with pt.  

## 2022-07-03 NOTE — TOC Transition Note (Signed)
Transition of Care [facility]) - CM/SW Discharge Note   Patient Details  Name: Jonathan Woodward MRN: [id number] Date of Birth: 1932-07-18  Transition of Care [facility]) CM/SW Contact:  Roseanne Kaufman, RN Phone Number: 07/03/2022, 9:15 AM   Clinical Narrative:   TOC has set up [facility] services with [place].  No additional TOC needs.    Final next level of care: Home w Home Health Services Barriers to Discharge: No Barriers Identified   Patient Goals and CMS Choice CMS Medicare.gov Compare Post Acute Care list provided to:: Patient Choice offered to / list presented to : Patient  Discharge Placement                         Discharge Plan and Services Additional resources added to the After Visit Summary for                  DME Arranged: N/A DME Agency: NA       HH Arranged: PT HH Agency: Licensed conveyancer spoke with at [place] in orthopedist's office  Social Determinants of Health ([place]) Interventions SDOH Screenings   Food Insecurity: No Food Insecurity (07/02/2022)  Housing: Low Risk  (07/02/2022)  Transportation Needs: No Transportation Needs (07/02/2022)  Utilities: Not At Risk (07/02/2022)  Tobacco Use: Medium Risk (07/02/2022)     Readmission Risk Interventions     No data to display

## 2022-07-03 NOTE — Discharge Summary (Signed)
Physician Discharge Summary  Patient ID: Jonathan Woodward MRN: [id number] DOB/AGE: 1932-08-24 89 y.o.  Admit date: 07/02/2022 Discharge date: 07/03/2022  Admission Diagnoses: right knee OA  Discharge Diagnoses:  Principal Problem:   Primary osteoarthritis of right knee   Discharged Condition: fair  Hospital Course: Patient underwent a right TKA by Dr. Blanchie Dessert on 07/02/22 without complications. He spent the night in observation for pain control and mobilization.     Discharge Exam: Blood pressure (!) 166/66, pulse 74, temperature 98.1 F (36.7 C), temperature source Oral, resp. rate 15, height 5\' 6"  (1.676 m), weight 77.1 kg, SpO2 97 %. General appearance: alert, cooperative, and no distress Resp: no use of accessory muscles Extremities: extremities normal, atraumatic, no cyanosis or edema Pulses:  L brachial 2+ R brachial 2+  L radial 2+ R radial 2+  L inguinal 2+ R inguinal 2+  L popliteal 2+ R popliteal 2+  L posterior tibial 2+ R posterior tibial 2+  L dorsalis pedis 2+ R dorsalis pedis 2+   Neurologic: Grossly normal Incision/Wound: c/d/i  Disposition: Discharge disposition: 06-Home-Health Care Svc       Discharge Instructions     Call MD / Call 911   Complete by: As directed    If you experience chest pain or shortness of breath, CALL 911 and be transported to the hospital emergency room.  If you develope a fever above 101 F, pus (white drainage) or increased drainage or redness at the wound, or calf pain, call your surgeon's office.   Call MD / Call 911   Complete by: As directed    If you experience chest pain or shortness of breath, CALL 911 and be transported to the hospital emergency room.  If you develope a fever above 101 F, pus (white drainage) or increased drainage or redness at the wound, or calf pain, call your surgeon's office.   Call MD / Call 911   Complete by: As directed    If you experience chest pain or shortness of breath, CALL 911 and  be transported to the hospital emergency room.  If you develope a fever above 101 F, pus (white drainage) or increased drainage or redness at the wound, or calf pain, call your surgeon's office.   Constipation Prevention   Complete by: As directed    Drink plenty of fluids.  Prune juice may be helpful.  You may use a stool softener, such as Colace (over the counter) 100 mg twice a day.  Use MiraLax (over the counter) for constipation as needed.   Constipation Prevention   Complete by: As directed    Drink plenty of fluids.  Prune juice may be helpful.  You may use a stool softener, such as Colace (over the counter) 100 mg twice a day.  Use MiraLax (over the counter) for constipation as needed.   Diet - low sodium heart healthy   Complete by: As directed    Diet - low sodium heart healthy   Complete by: As directed    Diet - low sodium heart healthy   Complete by: As directed    Do not put a pillow under the knee. Place it under the heel.   Complete by: As directed    Do not put a pillow under the knee. Place it under the heel.   Complete by: As directed    Do not put a pillow under the knee. Place it under the heel.   Complete by: As directed  Driving restrictions   Complete by: As directed    No driving for 4 weeks   Driving restrictions   Complete by: As directed    No driving for 4 weeks   Increase activity slowly as tolerated   Complete by: As directed    Increase activity slowly as tolerated   Complete by: As directed    Post-operative opioid taper instructions:   Complete by: As directed    POST-OPERATIVE OPIOID TAPER INSTRUCTIONS: It is important to wean off of your opioid medication as soon as possible. If you do not need pain medication after your surgery it is ok to stop day one. Opioids include: Codeine, Hydrocodone(Norco, Vicodin), Oxycodone(Percocet, oxycontin) and hydromorphone amongst others.  Long term and even short term use of opiods can cause: Increased pain  response Dependence Constipation Depression Respiratory depression And more.  Withdrawal symptoms can include Flu like symptoms Nausea, vomiting And more Techniques to manage these symptoms Hydrate well Eat regular healthy meals Stay active Use relaxation techniques(deep breathing, meditating, yoga) Do Not substitute Alcohol to help with tapering If you have been on opioids for less than two weeks and do not have pain than it is ok to stop all together.  Plan to wean off of opioids This plan should start within one week post op of your joint replacement. Maintain the same interval or time between taking each dose and first decrease the dose.  Cut the total daily intake of opioids by one tablet each day Next start to increase the time between doses. The last dose that should be eliminated is the evening dose.      Post-operative opioid taper instructions:   Complete by: As directed    POST-OPERATIVE OPIOID TAPER INSTRUCTIONS: It is important to wean off of your opioid medication as soon as possible. If you do not need pain medication after your surgery it is ok to stop day one. Opioids include: Codeine, Hydrocodone(Norco, Vicodin), Oxycodone(Percocet, oxycontin) and hydromorphone amongst others.  Long term and even short term use of opiods can cause: Increased pain response Dependence Constipation Depression Respiratory depression And more.  Withdrawal symptoms can include Flu like symptoms Nausea, vomiting And more Techniques to manage these symptoms Hydrate well Eat regular healthy meals Stay active Use relaxation techniques(deep breathing, meditating, yoga) Do Not substitute Alcohol to help with tapering If you have been on opioids for less than two weeks and do not have pain than it is ok to stop all together.  Plan to wean off of opioids This plan should start within one week post op of your joint replacement. Maintain the same interval or time between taking  each dose and first decrease the dose.  Cut the total daily intake of opioids by one tablet each day Next start to increase the time between doses. The last dose that should be eliminated is the evening dose.      Post-operative opioid taper instructions:   Complete by: As directed    POST-OPERATIVE OPIOID TAPER INSTRUCTIONS: It is important to wean off of your opioid medication as soon as possible. If you do not need pain medication after your surgery it is ok to stop day one. Opioids include: Codeine, Hydrocodone(Norco, Vicodin), Oxycodone(Percocet, oxycontin) and hydromorphone amongst others.  Long term and even short term use of opiods can cause: Increased pain response Dependence Constipation Depression Respiratory depression And more.  Withdrawal symptoms can include Flu like symptoms Nausea, vomiting And more Techniques to manage these symptoms Hydrate well Eat regular  healthy meals Stay active Use relaxation techniques(deep breathing, meditating, yoga) Do Not substitute Alcohol to help with tapering If you have been on opioids for less than two weeks and do not have pain than it is ok to stop all together.  Plan to wean off of opioids This plan should start within one week post op of your joint replacement. Maintain the same interval or time between taking each dose and first decrease the dose.  Cut the total daily intake of opioids by one tablet each day Next start to increase the time between doses. The last dose that should be eliminated is the evening dose.      Weight bearing as tolerated   Complete by: As directed       Allergies as of 07/03/2022   No Known Allergies      Medication List     TAKE these medications    acetaminophen 500 MG tablet Commonly known as: TYLENOL Take 2 tablets (1,000 mg total) by mouth every 8 (eight) hours as needed.   amLODipine 2.5 MG tablet Commonly known as: NORVASC Take 1 tablet (2.5 mg total) by mouth daily.    apixaban 2.5 MG Tabs tablet Commonly known as: Eliquis Take 1 tablet (2.5 mg total) by mouth 2 (two) times daily for 2 days. What changed: You were already taking a medication with the same name, and this prescription was added. Make sure you understand how and when to take each.   apixaban 5 MG Tabs tablet Commonly known as: Eliquis Take 1 tablet (5 mg total) by mouth 2 (two) times daily. Start taking on: July 05, 2022 What changed: These instructions start on July 05, 2022. If you are unsure what to do until then, ask your doctor or other care provider.   Fish Oil 1000 MG Caps Take 1,000 mg by mouth in the morning.   Flaxseed Oil 1000 MG Caps Take 1,000 mg by mouth in the morning.   flecainide 50 MG tablet Commonly known as: TAMBOCOR Take 1 tablet (50 mg total) by mouth 2 (two) times daily. Please contact the office to schedule appointment for additional refills.   folic acid 400 MCG tablet Commonly known as: FOLVITE Take 400 mcg by mouth daily.   losartan 50 MG tablet Commonly known as: COZAAR Take 50 mg by mouth 2 (two) times daily.   methocarbamol 500 MG tablet Commonly known as: ROBAXIN Take 1 tablet (500 mg total) by mouth every 8 (eight) hours as needed for up to 10 days for muscle spasms.   methotrexate 5 MG tablet Commonly known as: RHEUMATREX Take 10 mg by mouth once a week. Caution: Chemotherapy. Protect from light.takes on Thursdays.   metoprolol succinate 25 MG 24 hr tablet Commonly known as: TOPROL-XL Take 1 tablet (25 mg total) by mouth daily.   ondansetron 4 MG tablet Commonly known as: Zofran Take 1 tablet (4 mg total) by mouth every 8 (eight) hours as needed for up to 14 days for nausea or vomiting.   oxyCODONE 5 MG immediate release tablet Commonly known as: Roxicodone Take 1 tablet (5 mg total) by mouth every 4 (four) hours as needed for up to 7 days for severe pain or moderate pain.   predniSONE 5 MG tablet Commonly known as: DELTASONE Take  5 mg by mouth daily with breakfast.   sennosides-docusate sodium 8.6-50 MG tablet Commonly known as: SENOKOT-S Take 1 tablet by mouth daily.  Discharge Care Instructions  (From admission, onward)           Start     Ordered   07/03/22 0000  Weight bearing as tolerated        07/03/22 1123            Follow-up Information     Joen Laura, MD. Go on 07/16/2022.   Specialty: Orthopedic Surgery Why: Your appointment is scheduled for 4:30 Contact information: [address] [place] [address] [phone]         [facility], [facility] Follow up.   Specialty: Home Health Services Why: HHPT will provide 6 home visits prior to starting outpatient physical therapy Contact information: [address] [place] [address] [phone]         Therapy, [facility] Follow up on 07/19/2022.   Specialty: Physical Therapy Why: Your outpatient physical therapy will start at 10:00 Contact information: [address] [place] [address] [phone]                 Signed: Marzetta Board 07/03/2022, 11:24 AM

## 2022-07-04 DIAGNOSIS — Z7901 Long term (current) use of anticoagulants: Secondary | ICD-10-CM | POA: Diagnosis not present

## 2022-07-04 DIAGNOSIS — I10 Essential (primary) hypertension: Secondary | ICD-10-CM | POA: Diagnosis not present

## 2022-07-04 DIAGNOSIS — I4819 Other persistent atrial fibrillation: Secondary | ICD-10-CM | POA: Diagnosis not present

## 2022-07-04 DIAGNOSIS — K5900 Constipation, unspecified: Secondary | ICD-10-CM | POA: Diagnosis not present

## 2022-07-04 DIAGNOSIS — Z471 Aftercare following joint replacement surgery: Secondary | ICD-10-CM | POA: Diagnosis not present

## 2022-07-04 DIAGNOSIS — H409 Unspecified glaucoma: Secondary | ICD-10-CM | POA: Diagnosis not present

## 2022-07-04 DIAGNOSIS — Z87891 Personal history of nicotine dependence: Secondary | ICD-10-CM | POA: Diagnosis not present

## 2022-07-04 DIAGNOSIS — Z96651 Presence of right artificial knee joint: Secondary | ICD-10-CM | POA: Diagnosis not present

## 2022-07-04 DIAGNOSIS — Z96652 Presence of left artificial knee joint: Secondary | ICD-10-CM | POA: Diagnosis not present

## 2022-07-04 DIAGNOSIS — Z7951 Long term (current) use of inhaled steroids: Secondary | ICD-10-CM | POA: Diagnosis not present

## 2022-07-04 NOTE — Anesthesia Postprocedure Evaluation (Signed)
Anesthesia Post Note  Patient: Jonathan Woodward  Procedure(s) Performed: TOTAL KNEE ARTHROPLASTY (Right: Knee)     Patient location during evaluation: PACU Anesthesia Type: Regional and General Level of consciousness: awake and alert Pain management: pain level controlled Vital Signs Assessment: post-procedure vital signs reviewed and stable Respiratory status: spontaneous breathing, nonlabored ventilation, respiratory function stable and patient connected to nasal cannula oxygen Cardiovascular status: blood pressure returned to baseline and stable Postop Assessment: no apparent nausea or vomiting Anesthetic complications: no   No notable events documented.  Last Vitals:  Vitals:   07/03/22 0511 07/03/22 1034  BP: (!) 167/69 (!) 166/66  Pulse: 81 74  Resp: 20 15  Temp: 36.8 C 36.7 C  SpO2: 95% 97%    Last Pain:  Vitals:   07/03/22 1034  TempSrc: Oral  PainSc:                  [name]

## 2022-07-06 ENCOUNTER — Encounter (HOSPITAL_COMMUNITY): Payer: Self-pay | Admitting: Orthopedic Surgery

## 2022-07-13 DIAGNOSIS — Z96651 Presence of right artificial knee joint: Secondary | ICD-10-CM | POA: Diagnosis not present

## 2022-07-13 DIAGNOSIS — I10 Essential (primary) hypertension: Secondary | ICD-10-CM | POA: Diagnosis not present

## 2022-07-13 DIAGNOSIS — Z7901 Long term (current) use of anticoagulants: Secondary | ICD-10-CM | POA: Diagnosis not present

## 2022-07-13 DIAGNOSIS — K5900 Constipation, unspecified: Secondary | ICD-10-CM | POA: Diagnosis not present

## 2022-07-13 DIAGNOSIS — H409 Unspecified glaucoma: Secondary | ICD-10-CM | POA: Diagnosis not present

## 2022-07-13 DIAGNOSIS — Z471 Aftercare following joint replacement surgery: Secondary | ICD-10-CM | POA: Diagnosis not present

## 2022-07-13 DIAGNOSIS — Z96652 Presence of left artificial knee joint: Secondary | ICD-10-CM | POA: Diagnosis not present

## 2022-07-13 DIAGNOSIS — I4819 Other persistent atrial fibrillation: Secondary | ICD-10-CM | POA: Diagnosis not present

## 2022-07-13 DIAGNOSIS — Z7951 Long term (current) use of inhaled steroids: Secondary | ICD-10-CM | POA: Diagnosis not present

## 2022-07-13 DIAGNOSIS — Z87891 Personal history of nicotine dependence: Secondary | ICD-10-CM | POA: Diagnosis not present

## 2022-07-16 DIAGNOSIS — Z96652 Presence of left artificial knee joint: Secondary | ICD-10-CM | POA: Diagnosis not present

## 2022-07-16 DIAGNOSIS — Z96651 Presence of right artificial knee joint: Secondary | ICD-10-CM | POA: Diagnosis not present

## 2022-07-16 DIAGNOSIS — K5900 Constipation, unspecified: Secondary | ICD-10-CM | POA: Diagnosis not present

## 2022-07-16 DIAGNOSIS — Z7951 Long term (current) use of inhaled steroids: Secondary | ICD-10-CM | POA: Diagnosis not present

## 2022-07-16 DIAGNOSIS — Z7901 Long term (current) use of anticoagulants: Secondary | ICD-10-CM | POA: Diagnosis not present

## 2022-07-16 DIAGNOSIS — I10 Essential (primary) hypertension: Secondary | ICD-10-CM | POA: Diagnosis not present

## 2022-07-16 DIAGNOSIS — M1711 Unilateral primary osteoarthritis, right knee: Secondary | ICD-10-CM | POA: Diagnosis not present

## 2022-07-16 DIAGNOSIS — Z471 Aftercare following joint replacement surgery: Secondary | ICD-10-CM | POA: Diagnosis not present

## 2022-07-16 DIAGNOSIS — Z87891 Personal history of nicotine dependence: Secondary | ICD-10-CM | POA: Diagnosis not present

## 2022-07-16 DIAGNOSIS — H409 Unspecified glaucoma: Secondary | ICD-10-CM | POA: Diagnosis not present

## 2022-07-16 DIAGNOSIS — I4819 Other persistent atrial fibrillation: Secondary | ICD-10-CM | POA: Diagnosis not present

## 2022-07-19 ENCOUNTER — Encounter: Payer: Self-pay | Admitting: Cardiovascular Disease

## 2022-07-19 ENCOUNTER — Ambulatory Visit: Payer: PPO | Attending: Cardiovascular Disease | Admitting: Cardiovascular Disease

## 2022-07-19 VITALS — BP 156/66 | HR 71 | Ht 66.0 in | Wt 177.0 lb

## 2022-07-19 DIAGNOSIS — M0579 Rheumatoid arthritis with rheumatoid factor of multiple sites without organ or systems involvement: Secondary | ICD-10-CM | POA: Diagnosis not present

## 2022-07-19 DIAGNOSIS — I4819 Other persistent atrial fibrillation: Secondary | ICD-10-CM | POA: Diagnosis not present

## 2022-07-19 DIAGNOSIS — M25561 Pain in right knee: Secondary | ICD-10-CM | POA: Diagnosis not present

## 2022-07-19 DIAGNOSIS — M1711 Unilateral primary osteoarthritis, right knee: Secondary | ICD-10-CM | POA: Diagnosis not present

## 2022-07-19 DIAGNOSIS — Z79899 Other long term (current) drug therapy: Secondary | ICD-10-CM | POA: Diagnosis not present

## 2022-07-19 DIAGNOSIS — Z5181 Encounter for therapeutic drug level monitoring: Secondary | ICD-10-CM | POA: Diagnosis not present

## 2022-07-19 DIAGNOSIS — I2721 Secondary pulmonary arterial hypertension: Secondary | ICD-10-CM | POA: Diagnosis not present

## 2022-07-19 DIAGNOSIS — I519 Heart disease, unspecified: Secondary | ICD-10-CM

## 2022-07-19 DIAGNOSIS — I10 Essential (primary) hypertension: Secondary | ICD-10-CM

## 2022-07-19 DIAGNOSIS — D6869 Other thrombophilia: Secondary | ICD-10-CM

## 2022-07-19 MED ORDER — AMLODIPINE BESYLATE 5 MG PO TABS: 5.0000 mg | ORAL_TABLET | Freq: Every day | ORAL | 3 refills | Status: DC

## 2022-07-19 NOTE — Progress Notes (Signed)
Cardiology Office Note:    Date:  07/19/2022   ID:  Jonathan Woodward, DOB [birth year], MRN [id number]  PCP:  Jonathan Peak, PA-C   [facility] Providers Cardiologist:  Thurmon Fair, MD     Referring MD: Jonathan Peak, PA-C   Chief Complaint  Patient presents with   Atrial Fibrillation    History of Present Illness:    Jonathan Woodward is a 89 y.o. male with a hx of persistent atrial fibrillation status post successful cardioversion, rheumatoid arthritis of multiple joints, mild hypertension, knee osteoarthritis, history of carpal tunnel syndrome and surgery for cataracts.  He had initial successful cardioversion on October 16, 2021 with rapid recurrence of the arrhythmia in about 2 weeks.  He had successful cardioversion and maintenance of sinus rhythm on December 29, 2021 while on flecainide therapy.  He has remained in sinus rhythm since then, as far as we can tell.  He has never been aware of palpitations.  He is on chronic anticoagulation.  He is on a low-dose of beta-blocker and did not tolerate higher doses well due to bradycardia while in sinus rhythm (although the same dose is insufficient for rate control when he is in atrial fibrillation).  His blood pressure has been relatively high typically in the 140s/60s.  He has not had any readings less than 140 and today in the office his blood pressure was 152/60, rechecked 156/66  In the interim he has undergone sequential bilateral knee replacement surgery, on the left side in November 2023 and on the right side on 07/02/2022 and has had a remarkably rapid and easy recovery.  He did not have problems with atrial fibrillation during either surgical admission.  Labs confirm that he has rheumatoid arthritis (seropositive) and he is now receiving methotrexate under the supervision of his rheumatologist, Dr. Dierdre Forth, with excellent relief in symptoms.  He is also had bilateral shoulder injections that have helped with his mobility  a lot.  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  The patient specifically denies any chest pain at rest or with exertion, dyspnea at rest or with exertion, orthopnea, paroxysmal nocturnal dyspnea, syncope, palpitations, focal neurological deficits, intermittent claudication, lower extremity edema, unexplained weight gain, cough, hemoptysis or wheezing.  He has not had falls, injuries or bleeding problems  He is unaware of the arrhythmia.  He is remarkably physically active for his age.  He does a lot of yard work, cutting down trees and clearing limbs.  He lives independently with his 87 year old significant other on a large property.  He had COVID-19 infection about a year ago and has noticed that he has mild exertional dyspnea (NYHA functional class II) since then.   Past Medical History:  Diagnosis Date   Arthritis    Rheumatoid Arthritis   Atrial fibrillation    Dysrhythmia    Glaucoma    HTN (hypertension)     Past Surgical History:  Procedure Laterality Date   CARDIOVERSION N/A 10/16/2021   Procedure: CARDIOVERSION;  Surgeon: Chrystie Nose, MD;  Location: [facility] ENDOSCOPY;  Service: Cardiovascular;  Laterality: N/A;   CARDIOVERSION N/A 12/29/2021   Procedure: CARDIOVERSION;  Surgeon: Parke Poisson, MD;  Location: [facility] ENDOSCOPY;  Service: Cardiovascular;  Laterality: N/A;   CATARACT EXTRACTION     TOTAL KNEE ARTHROPLASTY Left 03/03/2022   Procedure: TOTAL KNEE ARTHROPLASTY;  Surgeon: Joen Laura, MD;  Location: [facility] ORS;  Service: Orthopedics;  Laterality: Left;   TOTAL KNEE ARTHROPLASTY Right 07/02/2022  Procedure: TOTAL KNEE ARTHROPLASTY;  Surgeon: Joen Laura, MD;  Location: [facility] ORS;  Service: Orthopedics;  Laterality: Right;    Current Medications: Current Meds  Medication Sig   amLODipine (NORVASC) 2.5 MG tablet Take 1 tablet (2.5 mg total) by mouth daily.   apixaban (ELIQUIS) 5 MG TABS tablet Take 1 tablet (5 mg total) by mouth 2  (two) times daily.   Flaxseed, Linseed, (FLAXSEED OIL) 1000 MG CAPS Take 1,000 mg by mouth in the morning.   flecainide (TAMBOCOR) 50 MG tablet Take 1 tablet (50 mg total) by mouth 2 (two) times daily. Please contact the office to schedule appointment for additional refills.   folic acid (FOLVITE) 400 MCG tablet Take 400 mcg by mouth daily.   losartan (COZAAR) 50 MG tablet Take 50 mg by mouth 2 (two) times daily.   methotrexate (RHEUMATREX) 2.5 MG tablet Take 2.5 mg by mouth once a week. Take 4 tablets weekly   metoprolol succinate (TOPROL-XL) 25 MG 24 hr tablet Take 1 tablet (25 mg total) by mouth daily.   Omega-3 Fatty Acids (FISH OIL) 1000 MG CAPS Take 1,000 mg by mouth in the morning.   predniSONE (DELTASONE) 5 MG tablet Take 5 mg by mouth daily with breakfast.   sennosides-docusate sodium (SENOKOT-S) 8.6-50 MG tablet Take 1 tablet by mouth daily.     Allergies:   Patient has no known allergies.   Social History   Socioeconomic History   Marital status: Single    Spouse name: Not on file   Number of children: Not on file   Years of education: Not on file   Highest education level: Not on file  Occupational History   Not on file  Tobacco Use   Smoking status: Former    Types: Cigarettes, Pipe, Cigars    Quit date: 1980    Years since quitting: 44.2   Smokeless tobacco: Former    Types: Chew, Snuff    Quit date: 1980  Vaping Use   Vaping Use: Never used  Substance and Sexual Activity   Alcohol use: Never   Drug use: Never   Sexual activity: Not Currently  Other Topics Concern   Not on file  Social History Narrative   Not on file   Social Determinants of Health   Financial Resource Strain: Not on file  Food Insecurity: No Food Insecurity (07/02/2022)   Hunger Vital Sign    Worried About Running Out of Food in the Last Year: Never true    Ran Out of Food in the Last Year: Never true  Transportation Needs: No Transportation Needs (07/02/2022)   PRAPARE - Therapist, art (Medical): No    Lack of Transportation (Non-Medical): No  Physical Activity: Not on file  Stress: Not on file  Social Connections: Not on file     Family History: The patient's family history is significantly negative for cardiac illness or stroke  ROS:   Please see the history of present illness.     All other systems reviewed and are negative.  EKGs/Labs/Other Studies Reviewed:    The following studies were reviewed today:   Echocardiogram 10/09/2021    1. Left ventricular ejection fraction, by estimation, is 65 to 70%. The  left ventricle has normal function. The left ventricle has no regional  wall motion abnormalities. There is mild left ventricular hypertrophy.  Left ventricular diastolic parameters  are indeterminate. Elevated left ventricular end-diastolic pressure. The  E/e' is 66.  2. Right ventricular systolic function is mildly reduced. The right  ventricular size is mildly enlarged. There is moderately elevated  pulmonary artery systolic pressure. The estimated right ventricular  systolic pressure is 46.7 mmHg.   3. Left atrial size was severely dilated.   4. Right atrial size was moderately dilated.   5. The mitral valve is grossly normal. Trivial mitral valve  regurgitation. No evidence of mitral stenosis.   6. Tricuspid valve regurgitation is mild to moderate.   7. The aortic valve is grossly normal. There is mild calcification of the  aortic valve. Aortic valve regurgitation is trivial. No aortic stenosis is  present.   8. The inferior vena cava is normal in size with <50% respiratory  variability, suggesting right atrial pressure of 8 mmHg.   EKG:  EKG is ordered today.  It is unchanged from previous tracings and shows normal sinus rhythm with right bundle branch block.  RBBB proceeds treatment with flecainide.  Recent Labs: 06/22/2022: ALT 13 07/03/2022: BUN 20; Creatinine, Ser 0.90; Hemoglobin 10.3; Platelets 147; Potassium  4.2; Sodium 133  Recent Lipid Panel No results found for: "CHOL", "TRIG", "HDL", "CHOLHDL", "VLDL", "LDLCALC", "LDLDIRECT" 08/31/2021 Cholesterol 163, HDL 69, LDL 82, triglycerides 62  Risk Assessment/Calculations:    CHA2DS2-VASc Score = 3   This indicates a 3.2% annual risk of stroke. The patient's score is based upon: CHF History: 0 HTN History: 1 Diabetes History: 0 Stroke History: 0 Vascular Disease History: 0 Age Score: 2 Gender Score: 0            Physical Exam:    VS:  BP (!) 156/66   Pulse 71   Ht 5\' 6"  (1.676 m)   Wt 177 lb (80.3 kg)   SpO2 96%   BMI 28.57 kg/m     Wt Readings from Last 3 Encounters:  07/19/22 177 lb (80.3 kg)  07/02/22 170 lb (77.1 kg)  06/22/22 170 lb (77.1 kg)     General: Alert, oriented x3, no distress, appears substantially younger than stated age. Head: no evidence of trauma, PERRL, EOMI, no exophtalmos or lid lag, no myxedema, no xanthelasma; normal ears, nose and oropharynx Neck: normal jugular venous pulsations and no hepatojugular reflux; brisk carotid pulses without delay and no carotid bruits Chest: clear to auscultation, no signs of consolidation by percussion or palpation, normal fremitus, symmetrical and full respiratory excursions Cardiovascular: normal position and quality of the apical impulse, regular rhythm, normal first and widely split second heart sounds, no murmurs, rubs or gallops Abdomen: no tenderness or distention, no masses by palpation, no abnormal pulsatility or arterial bruits, normal bowel sounds, no hepatosplenomegaly Extremities: no clubbing, cyanosis or edema; 2+ radial, ulnar and brachial pulses bilaterally; 2+ right femoral, posterior tibial and dorsalis pedis pulses; 2+ left femoral, posterior tibial and dorsalis pedis pulses; no subclavian or femoral bruits Neurological: grossly nonfocal Psych: Normal mood and affect    ASSESSMENT:    1. Persistent atrial fibrillation   2. Essential  hypertension   3. Right ventricular dysfunction   4. PAH (pulmonary artery hypertension)   5. Acquired thrombophilia   6. Encounter for monitoring flecainide therapy   7. Rheumatoid arthritis involving multiple sites with positive rheumatoid factor        PLAN:    In order of problems listed above:  AFib: Successfully maintaining sinus rhythm on flecainide.  No clear evidence that the arrhythmia had a big impact on his functional status, but he is tolerating the current treatment well.  CHA2DS2-VASc 3 on appropriate anticoagulation. HTN: Not well-controlled.  Increase amlodipine to 5 mg daily. RV dysfunction: No change in his breathing status or stamina.  In fact feels much better after he started treatment for rheumatoid arthritis.  His echocardiogram does show evidence of mildly reduced right ventricular systolic function and mild right ventricular dilation and the PA pressure is elevated at roughly 47 mmHg.  He does not have symptoms of sleep apnea or any signs of right heart failure.  He smoked for a while, but quit 40 years ago and does not have typical findings of COPD.  Developed mild exertional dyspnea following COVID-19 infection last year.  Has RBBB on ECG.  He does not have overt clinical findings of right heart failure/hypervolemia.  He will be on chronic anticoagulation.  I am not entirely sure of the benefit of aggressive work-up for his pulmonary hypertension at this point.  Will monitor with an echocardiogram later this year. Anticoagulation: No bleeding problems. Flecainide: No signs of proarrhythmia or toxicity clinically or by his EKG. History of carpal tunnel release: No other findings to raise suspicion for amyloidosis on his history.  No evidence of infiltrative cardiomyopathy on echocardiogram..  Normal voltage on ECG.  Does not have symptoms of heart failure. Rheumatoid arthritis: Substantial subjective improvement and objective reduction in the signs of inflammation in  his digits.  On methotrexate therapy with Dr. Dierdre Forth.  Still on a very small dose of prednisone.   Shared Decision Making/Informed Consent The risks (stroke, cardiac arrhythmias rarely resulting in the need for a temporary or permanent pacemaker, skin irritation or burns and complications associated with conscious sedation including aspiration, arrhythmia, respiratory failure and death), benefits (restoration of normal sinus rhythm) and alternatives of a direct current cardioversion were explained in detail to Mr. Ohm and he agrees to proceed.     Medication Adjustments/Labs and Tests Ordered: Current medicines are reviewed at length with the patient today.  Concerns regarding medicines are outlined above.  Orders Placed This Encounter  Procedures   EKG 12-Lead   No orders of the defined types were placed in this encounter.   Patient Instructions  Medication Instructions:  No changes *If you need a refill on your cardiac medications before your next appointment, please call your pharmacy*  Follow-Up: At [facility], you and your health needs are our priority.  As part of our continuing mission to provide you with exceptional heart care, we have created designated Provider Care Teams.  These Care Teams include your primary Cardiologist (physician) and Advanced Practice Providers (APPs -  Physician Assistants and Nurse Practitioners) who all work together to provide you with the care you need, when you need it.  We recommend signing up for the patient portal called "MyChart".  Sign up information is provided on this After Visit Summary.  MyChart is used to connect with patients for Virtual Visits (Telemedicine).  Patients are able to view lab/test results, encounter notes, upcoming appointments, etc.  Non-urgent messages can be sent to your provider as well.   To learn more about what you can do with MyChart, go to [email or web address].    Your next appointment:   6  month(s)  Provider:   Thurmon Fair, MD       Signed, Thurmon Fair, MD  07/19/2022 12:57 PM    [facility]

## 2022-07-19 NOTE — Patient Instructions (Addendum)
Medication Instructions:  Increase amlodipine to 5 mg daily *If you need a refill on your cardiac medications before your next appointment, please call your pharmacy*  Follow-Up: At [facility], you and your health needs are our priority.  As part of our continuing mission to provide you with exceptional heart care, we have created designated Provider Care Teams.  These Care Teams include your primary Cardiologist (physician) and Advanced Practice Providers (APPs -  Physician Assistants and Nurse Practitioners) who all work together to provide you with the care you need, when you need it.  We recommend signing up for the patient portal called "MyChart".  Sign up information is provided on this After Visit Summary.  MyChart is used to connect with patients for Virtual Visits (Telemedicine).  Patients are able to view lab/test results, encounter notes, upcoming appointments, etc.  Non-urgent messages can be sent to your provider as well.   To learn more about what you can do with MyChart, go to [email or web address].    Your next appointment:   6 month(s)  Provider:   Thurmon Fair, MD

## 2022-07-21 DIAGNOSIS — M1711 Unilateral primary osteoarthritis, right knee: Secondary | ICD-10-CM | POA: Diagnosis not present

## 2022-07-21 DIAGNOSIS — M25561 Pain in right knee: Secondary | ICD-10-CM | POA: Diagnosis not present

## 2022-07-26 DIAGNOSIS — M25561 Pain in right knee: Secondary | ICD-10-CM | POA: Diagnosis not present

## 2022-07-26 DIAGNOSIS — M1711 Unilateral primary osteoarthritis, right knee: Secondary | ICD-10-CM | POA: Diagnosis not present

## 2022-07-28 DIAGNOSIS — M1711 Unilateral primary osteoarthritis, right knee: Secondary | ICD-10-CM | POA: Diagnosis not present

## 2022-07-28 DIAGNOSIS — M25561 Pain in right knee: Secondary | ICD-10-CM | POA: Diagnosis not present

## 2022-08-02 DIAGNOSIS — M1711 Unilateral primary osteoarthritis, right knee: Secondary | ICD-10-CM | POA: Diagnosis not present

## 2022-08-02 DIAGNOSIS — M25561 Pain in right knee: Secondary | ICD-10-CM | POA: Diagnosis not present

## 2022-08-04 DIAGNOSIS — M25561 Pain in right knee: Secondary | ICD-10-CM | POA: Diagnosis not present

## 2022-08-04 DIAGNOSIS — M1711 Unilateral primary osteoarthritis, right knee: Secondary | ICD-10-CM | POA: Diagnosis not present

## 2022-08-09 DIAGNOSIS — M25561 Pain in right knee: Secondary | ICD-10-CM | POA: Diagnosis not present

## 2022-08-09 DIAGNOSIS — M1711 Unilateral primary osteoarthritis, right knee: Secondary | ICD-10-CM | POA: Diagnosis not present

## 2022-08-11 DIAGNOSIS — M1711 Unilateral primary osteoarthritis, right knee: Secondary | ICD-10-CM | POA: Diagnosis not present

## 2022-08-11 DIAGNOSIS — M25561 Pain in right knee: Secondary | ICD-10-CM | POA: Diagnosis not present

## 2022-08-12 DIAGNOSIS — M1711 Unilateral primary osteoarthritis, right knee: Secondary | ICD-10-CM | POA: Diagnosis not present

## 2022-09-09 DIAGNOSIS — M1710 Unilateral primary osteoarthritis, unspecified knee: Secondary | ICD-10-CM | POA: Diagnosis not present

## 2022-09-16 DIAGNOSIS — Z6828 Body mass index (BMI) 28.0-28.9, adult: Secondary | ICD-10-CM | POA: Diagnosis not present

## 2022-09-16 DIAGNOSIS — E663 Overweight: Secondary | ICD-10-CM | POA: Diagnosis not present

## 2022-09-16 DIAGNOSIS — M0579 Rheumatoid arthritis with rheumatoid factor of multiple sites without organ or systems involvement: Secondary | ICD-10-CM | POA: Diagnosis not present

## 2022-09-16 DIAGNOSIS — M170 Bilateral primary osteoarthritis of knee: Secondary | ICD-10-CM | POA: Diagnosis not present

## 2022-09-27 ENCOUNTER — Other Ambulatory Visit: Payer: Self-pay | Admitting: Cardiovascular Disease

## 2022-10-11 ENCOUNTER — Other Ambulatory Visit: Payer: Self-pay | Admitting: Cardiovascular Disease

## 2022-11-04 DIAGNOSIS — M25812 Other specified joint disorders, left shoulder: Secondary | ICD-10-CM | POA: Diagnosis not present

## 2022-11-04 DIAGNOSIS — M25811 Other specified joint disorders, right shoulder: Secondary | ICD-10-CM | POA: Diagnosis not present

## 2022-11-12 DIAGNOSIS — G4700 Insomnia, unspecified: Secondary | ICD-10-CM | POA: Diagnosis not present

## 2022-11-12 DIAGNOSIS — I10 Essential (primary) hypertension: Secondary | ICD-10-CM | POA: Diagnosis not present

## 2022-11-12 DIAGNOSIS — I4891 Unspecified atrial fibrillation: Secondary | ICD-10-CM | POA: Diagnosis not present

## 2022-11-12 DIAGNOSIS — M069 Rheumatoid arthritis, unspecified: Secondary | ICD-10-CM | POA: Diagnosis not present

## 2022-12-15 ENCOUNTER — Telehealth: Payer: Self-pay | Admitting: Cardiovascular Disease

## 2022-12-15 NOTE — Telephone Encounter (Signed)
Pt c/o BP issue: STAT if pt c/o blurred vision, one-sided weakness or slurred speech  1. What are your last 5 BP readings?  9/04: 224/92, 186/83 Dropped within 1 hour  2. Are you having any other symptoms (ex. Dizziness, headache, blurred vision, passed out)? Nervous/anxious   3. What is your BP issue?  Patient states BP is elevated and he feels nervous.

## 2022-12-15 NOTE — Telephone Encounter (Signed)
Returned pt call. Phone was answered and then hung up.

## 2022-12-16 ENCOUNTER — Encounter: Payer: Self-pay | Admitting: Cardiovascular Disease

## 2022-12-16 NOTE — Telephone Encounter (Signed)
Attempt #2 in returning pt's call. Pt states his BP is running high. Today it was 177/86 and yesterday it was 224/92 and 186/83. Pt called his family doctor and he changed his Metoprolol Succinate to 50mg  a day. Pt would like to let Dr. Royann Shivers know and see if he has any other recommendations.

## 2022-12-16 NOTE — Telephone Encounter (Signed)
In the past when we used a higher dose of metoprolol he had problems with fatigue due to a slow heart rate.  Please ask him to report his heart rate as well when he communicates his blood pressure.  If his slow heart rate becomes an issue, he might do better transitioning to carvedilol instead of metoprolol, which will reduce his blood pressure more for the same degree of heart rate reduction.

## 2022-12-16 NOTE — Telephone Encounter (Signed)
Returned call to pt with Dr. Royann Shivers, LVM with the recommendations below. Also sent a MyChart message.    Per Dr. Royann Shivers In the past when we used a higher dose of metoprolol he had problems with fatigue due to a slow heart rate.  Please ask him to report his heart rate as well when he communicates his blood pressure.  If his slow heart rate becomes an issue, he might do better transitioning to carvedilol instead of metoprolol, which will reduce his blood pressure more for the same degree of heart rate reduction.

## 2022-12-23 DIAGNOSIS — M0579 Rheumatoid arthritis with rheumatoid factor of multiple sites without organ or systems involvement: Secondary | ICD-10-CM | POA: Diagnosis not present

## 2022-12-23 DIAGNOSIS — E663 Overweight: Secondary | ICD-10-CM | POA: Diagnosis not present

## 2022-12-23 DIAGNOSIS — M170 Bilateral primary osteoarthritis of knee: Secondary | ICD-10-CM | POA: Diagnosis not present

## 2022-12-23 DIAGNOSIS — Z6829 Body mass index (BMI) 29.0-29.9, adult: Secondary | ICD-10-CM | POA: Diagnosis not present

## 2022-12-31 ENCOUNTER — Other Ambulatory Visit: Payer: Self-pay | Admitting: Cardiovascular Disease

## 2023-01-11 DIAGNOSIS — I4891 Unspecified atrial fibrillation: Secondary | ICD-10-CM | POA: Diagnosis not present

## 2023-01-11 DIAGNOSIS — I10 Essential (primary) hypertension: Secondary | ICD-10-CM | POA: Diagnosis not present

## 2023-01-16 ENCOUNTER — Encounter: Payer: Self-pay | Admitting: Cardiovascular Disease

## 2023-01-17 NOTE — Telephone Encounter (Signed)
Medication list updated.

## 2023-01-19 DIAGNOSIS — M25811 Other specified joint disorders, right shoulder: Secondary | ICD-10-CM | POA: Diagnosis not present

## 2023-01-25 ENCOUNTER — Emergency Department (HOSPITAL_COMMUNITY): Payer: PPO

## 2023-01-25 ENCOUNTER — Encounter (HOSPITAL_COMMUNITY): Payer: Self-pay | Admitting: Emergency Medicine

## 2023-01-25 ENCOUNTER — Inpatient Hospital Stay (HOSPITAL_COMMUNITY)
Admission: EM | Admit: 2023-01-25 | Discharge: 2023-01-27 | DRG: 065 | Disposition: A | Discharge status: Home Health | Payer: PPO | Attending: Family Medicine | Admitting: Family Medicine

## 2023-01-25 ENCOUNTER — Other Ambulatory Visit: Payer: Self-pay

## 2023-01-25 DIAGNOSIS — I639 Cerebral infarction, unspecified: Secondary | ICD-10-CM

## 2023-01-25 DIAGNOSIS — M069 Rheumatoid arthritis, unspecified: Secondary | ICD-10-CM | POA: Diagnosis not present

## 2023-01-25 DIAGNOSIS — R4182 Altered mental status, unspecified: Secondary | ICD-10-CM | POA: Diagnosis not present

## 2023-01-25 DIAGNOSIS — R29701 NIHSS score 1: Secondary | ICD-10-CM | POA: Diagnosis not present

## 2023-01-25 DIAGNOSIS — E785 Hyperlipidemia, unspecified: Secondary | ICD-10-CM | POA: Diagnosis not present

## 2023-01-25 DIAGNOSIS — Z96653 Presence of artificial knee joint, bilateral: Secondary | ICD-10-CM | POA: Diagnosis present

## 2023-01-25 DIAGNOSIS — H409 Unspecified glaucoma: Secondary | ICD-10-CM | POA: Diagnosis not present

## 2023-01-25 DIAGNOSIS — I6340 Cerebral infarction due to embolism of unspecified cerebral artery: Secondary | ICD-10-CM | POA: Diagnosis not present

## 2023-01-25 DIAGNOSIS — I6523 Occlusion and stenosis of bilateral carotid arteries: Secondary | ICD-10-CM | POA: Diagnosis not present

## 2023-01-25 DIAGNOSIS — R001 Bradycardia, unspecified: Secondary | ICD-10-CM | POA: Diagnosis not present

## 2023-01-25 DIAGNOSIS — Z7982 Long term (current) use of aspirin: Secondary | ICD-10-CM | POA: Diagnosis not present

## 2023-01-25 DIAGNOSIS — Z79899 Other long term (current) drug therapy: Secondary | ICD-10-CM

## 2023-01-25 DIAGNOSIS — Z7901 Long term (current) use of anticoagulants: Secondary | ICD-10-CM | POA: Diagnosis not present

## 2023-01-25 DIAGNOSIS — I6389 Other cerebral infarction: Secondary | ICD-10-CM | POA: Diagnosis not present

## 2023-01-25 DIAGNOSIS — I4819 Other persistent atrial fibrillation: Secondary | ICD-10-CM | POA: Diagnosis present

## 2023-01-25 DIAGNOSIS — I6503 Occlusion and stenosis of bilateral vertebral arteries: Secondary | ICD-10-CM | POA: Diagnosis not present

## 2023-01-25 DIAGNOSIS — Z66 Do not resuscitate: Secondary | ICD-10-CM | POA: Diagnosis not present

## 2023-01-25 DIAGNOSIS — Z87891 Personal history of nicotine dependence: Secondary | ICD-10-CM | POA: Diagnosis not present

## 2023-01-25 DIAGNOSIS — F0390 Unspecified dementia without behavioral disturbance: Secondary | ICD-10-CM | POA: Diagnosis present

## 2023-01-25 DIAGNOSIS — M1990 Unspecified osteoarthritis, unspecified site: Secondary | ICD-10-CM | POA: Diagnosis not present

## 2023-01-25 DIAGNOSIS — I4891 Unspecified atrial fibrillation: Secondary | ICD-10-CM | POA: Diagnosis not present

## 2023-01-25 DIAGNOSIS — G9389 Other specified disorders of brain: Secondary | ICD-10-CM | POA: Diagnosis not present

## 2023-01-25 DIAGNOSIS — I63411 Cerebral infarction due to embolism of right middle cerebral artery: Secondary | ICD-10-CM | POA: Diagnosis not present

## 2023-01-25 DIAGNOSIS — I63511 Cerebral infarction due to unspecified occlusion or stenosis of right middle cerebral artery: Secondary | ICD-10-CM | POA: Diagnosis not present

## 2023-01-25 DIAGNOSIS — I10 Essential (primary) hypertension: Secondary | ICD-10-CM | POA: Diagnosis present

## 2023-01-25 DIAGNOSIS — Z7952 Long term (current) use of systemic steroids: Secondary | ICD-10-CM

## 2023-01-25 DIAGNOSIS — Z471 Aftercare following joint replacement surgery: Secondary | ICD-10-CM | POA: Diagnosis not present

## 2023-01-25 HISTORY — DX: Cerebral infarction, unspecified: I63.9

## 2023-01-25 LAB — URINALYSIS, ROUTINE W REFLEX MICROSCOPIC
Bilirubin Urine: NEGATIVE
Glucose, UA: NEGATIVE mg/dL
Ketones, ur: NEGATIVE mg/dL
Leukocytes,Ua: NEGATIVE
Nitrite: NEGATIVE
Protein, ur: NEGATIVE mg/dL
Specific Gravity, Urine: 1.01 (ref 1.005–1.030)
pH: 7 (ref 5.0–8.0)

## 2023-01-25 LAB — DIFFERENTIAL
Abs Immature Granulocytes: 0.02 10*3/uL (ref 0.00–0.07)
Basophils Absolute: 0 10*3/uL (ref 0.0–0.1)
Basophils Relative: 1 %
Eosinophils Absolute: 0.1 10*3/uL (ref 0.0–0.5)
Eosinophils Relative: 2 %
Immature Granulocytes: 0 %
Lymphocytes Relative: 22 %
Lymphs Abs: 1.4 10*3/uL (ref 0.7–4.0)
Monocytes Absolute: 0.8 10*3/uL (ref 0.1–1.0)
Monocytes Relative: 12 %
Neutro Abs: 4.1 10*3/uL (ref 1.7–7.7)
Neutrophils Relative %: 63 %

## 2023-01-25 LAB — COMPREHENSIVE METABOLIC PANEL
ALT: 15 U/L (ref 0–44)
AST: 14 U/L — ABNORMAL LOW (ref 15–41)
Albumin: 3.7 g/dL (ref 3.5–5.0)
Alkaline Phosphatase: 87 U/L (ref 38–126)
Anion gap: 11 (ref 5–15)
BUN: 15 mg/dL (ref 8–23)
CO2: 26 mmol/L (ref 22–32)
Calcium: 9.5 mg/dL (ref 8.9–10.3)
Chloride: 95 mmol/L — ABNORMAL LOW (ref 98–111)
Creatinine, Ser: 1.05 mg/dL (ref 0.61–1.24)
GFR, Estimated: >60 mL/min (ref >60)
Glucose, Bld: 95 mg/dL (ref 70–99)
Potassium: 4.3 mmol/L (ref 3.5–5.1)
Sodium: 132 mmol/L — ABNORMAL LOW (ref 135–145)
Total Bilirubin: 0.5 mg/dL (ref 0.3–1.2)
Total Protein: 6.7 g/dL (ref 6.5–8.1)

## 2023-01-25 LAB — CBC
HCT: 37.5 % — ABNORMAL LOW (ref 39.0–52.0)
Hemoglobin: 12.6 g/dL — ABNORMAL LOW (ref 13.0–17.0)
MCH: 31.6 pg (ref 26.0–34.0)
MCHC: 33.6 g/dL (ref 30.0–36.0)
MCV: 94 fL (ref 80.0–100.0)
Platelets: 177 10*3/uL (ref 150–400)
RBC: 3.99 MIL/uL — ABNORMAL LOW (ref 4.22–5.81)
RDW: 12.9 % (ref 11.5–15.5)
WBC: 6.3 10*3/uL (ref 4.0–10.5)
nRBC: 0 % (ref 0.0–0.2)

## 2023-01-25 LAB — PROTIME-INR
INR: 1.2 (ref 0.8–1.2)
Prothrombin Time: 15.6 s — ABNORMAL HIGH (ref 11.4–15.2)

## 2023-01-25 LAB — APTT: aPTT: 33 s (ref 24–36)

## 2023-01-25 LAB — I-STAT CHEM 8, ED
BUN: 17 mg/dL (ref 8–23)
Calcium, Ion: 1.12 mmol/L — ABNORMAL LOW (ref 1.15–1.40)
Chloride: 96 mmol/L — ABNORMAL LOW (ref 98–111)
Creatinine, Ser: 1 mg/dL (ref 0.61–1.24)
Glucose, Bld: 92 mg/dL (ref 70–99)
HCT: 40 % (ref 39.0–52.0)
Hemoglobin: 13.6 g/dL (ref 13.0–17.0)
Potassium: 4.3 mmol/L (ref 3.5–5.1)
Sodium: 130 mmol/L — ABNORMAL LOW (ref 135–145)
TCO2: 23 mmol/L (ref 22–32)

## 2023-01-25 LAB — URINALYSIS, MICROSCOPIC (REFLEX)

## 2023-01-25 LAB — ETHANOL: Alcohol, Ethyl (B): <10 mg/dL (ref <10)

## 2023-01-25 MED ORDER — IOHEXOL 350 MG/ML SOLN
80.0000 mL | Freq: Once | INTRAVENOUS | Status: AC | PRN
  Administered 2023-01-25: 75 mL via INTRAVENOUS

## 2023-01-25 MED ORDER — SENNOSIDES-DOCUSATE SODIUM 8.6-50 MG PO TABS: 1.0000 | ORAL_TABLET | Freq: Every day | ORAL | Status: DC | PRN

## 2023-01-25 MED ORDER — ASPIRIN 325 MG PO TABS
325.0000 mg | ORAL_TABLET | Freq: Every day | ORAL | Status: DC
  Administered 2023-01-26: 325 mg via ORAL
  Filled 2023-01-25: qty 1

## 2023-01-25 MED ORDER — METHOTREXATE SODIUM 2.5 MG PO TABS
12.5000 mg | ORAL_TABLET | ORAL | Status: DC
  Administered 2023-01-26: 12.5 mg via ORAL
  Filled 2023-01-25: qty 5

## 2023-01-25 MED ORDER — FOLIC ACID 1 MG PO TABS
1.0000 mg | ORAL_TABLET | Freq: Every day | ORAL | Status: DC
  Administered 2023-01-26 – 2023-01-27 (×2): 1 mg via ORAL
  Filled 2023-01-25 (×2): qty 1

## 2023-01-25 MED ORDER — PREDNISONE 5 MG PO TABS
5.0000 mg | ORAL_TABLET | Freq: Every day | ORAL | Status: DC
  Administered 2023-01-26 – 2023-01-27 (×2): 5 mg via ORAL
  Filled 2023-01-25 (×2): qty 1

## 2023-01-25 MED ORDER — FLECAINIDE ACETATE 50 MG PO TABS
50.0000 mg | ORAL_TABLET | Freq: Two times a day (BID) | ORAL | Status: DC
  Administered 2023-01-26 – 2023-01-27 (×3): 50 mg via ORAL
  Filled 2023-01-25 (×4): qty 1

## 2023-01-25 MED ORDER — ACETAMINOPHEN 650 MG RE SUPP: 650.0000 mg | Freq: Four times a day (QID) | RECTAL | Status: DC | PRN

## 2023-01-25 MED ORDER — METOPROLOL SUCCINATE ER 50 MG PO TB24
50.0000 mg | ORAL_TABLET | Freq: Every day | ORAL | Status: DC
  Administered 2023-01-26 – 2023-01-27 (×2): 50 mg via ORAL
  Filled 2023-01-25 (×2): qty 1

## 2023-01-25 MED ORDER — VITAMIN B-12 1000 MCG PO TABS
1000.0000 ug | ORAL_TABLET | Freq: Every day | ORAL | Status: DC
  Administered 2023-01-26 – 2023-01-27 (×2): 1000 ug via ORAL
  Filled 2023-01-25 (×2): qty 1

## 2023-01-25 MED ORDER — ACETAMINOPHEN 325 MG PO TABS: 650.0000 mg | ORAL_TABLET | Freq: Four times a day (QID) | ORAL | Status: DC | PRN

## 2023-01-25 NOTE — Assessment & Plan Note (Signed)
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 -Admit to progressive care, Dr. Jennette Kettle is the attending -Hold Eliquis -CT angiogram ordered, pending -q4h neuro checks -PT/OT eval -hold home antihypertensives pending neuro recommendations -AM BMP, Lipid panel, A1c

## 2023-01-25 NOTE — H&P (Cosign Needed)
Hospital Admission History and Physical Service Pager: [phone]  Patient name: Jonathan Woodward Medical record number: [id number] Date of Birth: [birth year] Age: 89 y.o. Gender: male  Primary Care Provider: Lonie Peak, PA-C Consultants: Neurology Code Status: DNR which was confirmed with the patient Preferred Emergency Contact: Leotis Shames is primary emergency contact Contact Information     Name Relation Home Work Mobile   LINEBERRY,BOBBY Friend [phone]     Daryel November   [phone]   DIXON,JULIE Daughter   [phone]      Other Contacts   None on File     Chief Complaint: Confusion/ new onset difficulty with coordination  Assessment and Plan: [name] is a 89 y.o. male presenting with 2 days of coordination difficulty and confusion, likely due to CVA.    Differential for this patient's presentation of this includes ischemic vs hemorrhagic stroke, substance intoxication, trauma, seizure, dementia, Parkinson's and neuropathy. Hemorrhagic stroke is less likely as there was no evidence of bleeding on the imaging studies (CT and MR), and the acute nature of his change from baseline makes dementia, neuropathy, and Parkinson's disease less likely as well. Blood alcohol level in the ED was <10.  Lab workup thus far has been unremarkabl.  Imaging at this point supports right MCA territory infarct. Assessment & Plan Cerebrovascular accident (CVA), unspecified mechanism (HCC) Large posterior right MCA territory infarct seen on CT head and MRI brain, neurology consulted appreciate their recommendations. -Admit to FMTS, progressive care, attending Dr. Jennette Kettle -Neurology following, appreciate recommendations -Continuous cardiac monitoring, vitals per unit with pulse ox check -Every 4 neurochecks -Follow-up CTA head and neck -Follow-up echocardiogram -PT/OT eval -Hold home Eliquis -hold home antihypertensives (amlodipine, Diovan HCTZ) pending  neuro recommendations -AM BMP, Lipid panel, A1c  Chronic and Stable Conditions: A-fib: Holding Eliquis, continue flecainide and metoprolol HTN: Holding home medications as above Osteoarthritis- PRN tylenol Rheumatoid arthritis- continue home methotrexate, prednisone, vitamin b12 and folic acid  FEN/GI: Heart Healthy VTE Prophylaxis: SCD  Disposition: Progressive  History of Present Illness:  [name] is a 89 y.o. male presenting with 2 days of difficulty with fine motor coordination.  He reports noticing that he was having difficulty with tying his shoes yesterday, which progressed to difficulty holding things in his hands and dropping objects which is not normal for him.  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  His daughters provided some collateral information, including that he had not been acting like his usual self.  They report that he was saying things that he normally would not, but did not elaborate on what those things were.  They report that normally he is very goal and task oriented but has seemed less so since yesterday.  They also report that on the way back from the bathroom in the emergency department he was unable to walk in a straight line and was continually veering off to the right.  In the ED, he was found to be alert and oriented with no obvious deficits. Report from ED provider notes that the patient is very independent at baseline, and works extensively with his hands. He had a CT head without contrast followed by an MRI which showed acute right MCA territory infarct.  Neurology consult was placed they recommended holding his Eliquis and obtaining CT angiogram for further characterization.  Labs were obtained including CBC CMP coagulation studies UA and  blood alcohol level.  Review Of Systems: Per HPI.  Pertinent Past Medical History: Persistent  Afib Osteoarthritis Rheumatoid arthritis HTN Remainder reviewed in history tab.   Pertinent Past Surgical History: None  Remainder reviewed in history tab.  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   Important Outpatient Medications: Eliquis 5 mg twice daily Amlodipine 5 mg daily Flecainide 50 mg twice daily Metoprolol succinate 50 mg daily Valsartan-hydrochlorothiazide 320-25 mg daily Methotrexate 12.5 mg weekly Prednisone 5 mg daily Trazodone 50 mg nightly Vitamin B12 1000 mcg daily Folic acid 1 mg daily Remainder reviewed in medication history.   Objective: BP (!) 166/69   Pulse (!) 56   Temp 98.6 F (37 C)   Resp 15   Ht 5\' 6"  (1.676 m)   Wt 80.3 kg   SpO2 92%   BMI 28.57 kg/m  Exam: General: Alert, oriented to person, place, time and situation. No obvious distress Eyes: PERRLA EOMI, conjunctiva mildly injected ENTM: Patent nares, mucous membranes moist and without erythema or swelling.  Tongue is midline, good dentition. Neck: Supple, nontender.  Trachea midline Cardiovascular: Regular rate and rhythm, no M/R/G. Respiratory: CTAB, no increased work of breathing Gastrointestinal: Flat, soft, nontender MSK: Appropriate strength and tone Derm: 1.5 cm x 1.5 cm skin tear present on the lateral aspect of the left upper arm Neuro: Cranial nerves II through XII intact, 5 out of 5 strength in the bilateral upper and lower extremities.  Intact sensation to touch throughout.  Some left sided discoordination with finger-to-nose testing.  Adequate heel-to-shin bilaterally Psych: Appropriate mood and affect.  Labs:  CBC BMET  Recent Labs  Lab 01/25/23 1843 01/25/23 1852  WBC 6.3  --   HGB 12.6* 13.6  HCT 37.5* 40.0  PLT 177  --    Recent Labs  Lab  01/25/23 1843 01/25/23 1852  NA 132* 130*  K 4.3 4.3  CL 95* 96*  CO2 26  --   BUN 15 17  CREATININE 1.05 1.00  GLUCOSE 95 92  CALCIUM 9.5  --     Pertinent additional labs   Latest Reference Range & Units 01/25/23 18:43  Prothrombin Time 11.4 - 15.2 seconds 15.6 (H)  INR 0.8 - 1.2  1.2  APTT 24 - 36 seconds 33  (H): Data is abnormally high  EKG: First-degree heart block no evidence of ST segment elevation.  Appears unchanged from prior study done on December 30, 2022  Imaging Studies Performed:  CT head without contrast IMPRESSION: Hypodensity in the posterior right MCA territory, consistent with acute infarct and edema from gyral swelling, as seen on the subsequent MRI.  MRI brain without contrast IMPRESSION: 1. Acute infarct in the posterior right MCA territory, involving the posterior right temporal lobe, posterior right frontal lobe, and anterior right parietal lobe. 2. A focus of hemosiderin deposition in the right posterior temporal lobe, which could indicate thrombus in a distal right MCA vessel versus sequela of prior microhemorrhage. 3. Additional punctate acute infarct in the left medial cerebellum.  My Interpretation: Probable ischemic type stroke of the right posterior MCA involving multiple lobes, evidence of possible thrombus in the distal right MCA.   Gerrit Heck, DO 01/25/2023, 11:37 PM PGY-1, [facility]  FPTS Intern pager: [phone], text pages welcome Secure chat group [facility]  Hospital Teaching Service    I was personally present and performed or re-performed the history, physical exam and medical decision making activities of this service and have verified that the service and findings are accurately documented in the student's note.  Shelby Mattocks, DO                  01/26/2023, 12:17 AM

## 2023-01-25 NOTE — Consult Note (Signed)
NEUROLOGY CONSULT NOTE   Date of service: January 25, 2023 Patient Name: Jonathan Woodward MRN:  [id number] DOB:  25-Apr-1932 Chief Complaint: "confusion" Requesting Provider: Terald Sleeper, MD  History of Present Illness  Jonathan Woodward is a 89 y.o. male  has a past medical history of Arthritis, Atrial fibrillation (HCC), Dysrhythmia, Glaucoma, and HTN (hypertension). who presents with confusion as well as "dropping things."  He states that he felt normal yesterday morning, but sometimes later in the day he started noticing that he was having some trouble accomplishing normal task.  He was dropping some tools, and had difficulty with tying his shoes which is not normally a problem for him.  He tied his shoes without difficulty yesterday morning.  Due to concerns for confusion, he was brought into the emergency department where an MRI was obtained demonstrating a sizable right MCA branch territory infarct.   LKW: 10/14 in the morning Modified rankin score: 0-Completely asymptomatic and back to baseline post- stroke IV Thrombolysis: No, outside of window NIHSS score: 1 1A: Level of Consciousness - 0 1B: Ask Month and Age - 0 1C: 'Blink Eyes' & 'Squeeze Hands' - 0 2: Test Horizontal Extraocular Movements - 0 3: Test Visual Fields - 0 4: Test Facial Palsy - 0 5A: Test Left Arm Motor Drift - 0 5B: Test Right Arm Motor Drift - 0 6A: Test Left Leg Motor Drift - 0 6B: Test Right Leg Motor Drift - 0 7: Test Limb Ataxia - 0 8: Test Sensation - 0 9: Test Language/Aphasia- 0 10: Test Dysarthria - 0 11: Test Extinction/Inattention - 1   Past History   Past Medical History:  Diagnosis Date   Arthritis    Rheumatoid Arthritis   Atrial fibrillation (HCC)    Dysrhythmia    Glaucoma    HTN (hypertension)     Past Surgical History:  Procedure Laterality Date   CARDIOVERSION N/A 10/16/2021   Procedure: CARDIOVERSION;  Surgeon: Chrystie Nose, MD;  Location: [facility] ENDOSCOPY;   Service: Cardiovascular;  Laterality: N/A;   CARDIOVERSION N/A 12/29/2021   Procedure: CARDIOVERSION;  Surgeon: Parke Poisson, MD;  Location: [facility] ENDOSCOPY;  Service: Cardiovascular;  Laterality: N/A;   CATARACT EXTRACTION     TOTAL KNEE ARTHROPLASTY Left 03/03/2022   Procedure: TOTAL KNEE ARTHROPLASTY;  Surgeon: Joen Laura, MD;  Location: [facility] ORS;  Service: Orthopedics;  Laterality: Left;   TOTAL KNEE ARTHROPLASTY Right 07/02/2022   Procedure: TOTAL KNEE ARTHROPLASTY;  Surgeon: Joen Laura, MD;  Location: [facility] ORS;  Service: Orthopedics;  Laterality: Right;    Family History: History reviewed. No pertinent family history.  Social History  reports that he quit smoking about 44 years ago. His smoking use included cigarettes, pipe, and cigars. He quit smokeless tobacco use about 44 years ago.  His smokeless tobacco use included chew and snuff. He reports that he does not drink alcohol and does not use drugs.  No Known Allergies  Medications   Current Facility-Administered Medications:    iohexol (OMNIPAQUE) 350 MG/ML injection 80 mL, 80 mL, Intravenous, Once PRN, Trifan, Kermit Balo, MD  Current Outpatient Medications:    amLODipine (NORVASC) 5 MG tablet, Take 1 tablet (5 mg total) by mouth daily., Disp: 90 tablet, Rfl: 3   apixaban (ELIQUIS) 5 MG TABS tablet, Take 1 tablet (5 mg total) by mouth 2 (two) times daily., Disp: 60 tablet, Rfl: 0   cyanocobalamin (VITAMIN B12) 1000 MCG tablet, Take 1,000 mcg by mouth daily., Disp: ,  Rfl:    Flaxseed, Linseed, (FLAXSEED OIL) 1000 MG CAPS, Take 1,000 mg by mouth in the morning., Disp: , Rfl:    flecainide (TAMBOCOR) 50 MG tablet, Take 1 tablet (50 mg total) by mouth 2 (two) times daily. Please contact the office to schedule appointment for additional refills., Disp: 180 tablet, Rfl: 1   folic acid (FOLVITE) 1 MG tablet, Take 1 mg by mouth daily., Disp: , Rfl:    methotrexate (RHEUMATREX) 2.5 MG tablet, Take 12.5 mg by mouth every  Wednesday. Take 5 tablets by mouth weekly, Disp: , Rfl:    metoprolol succinate (TOPROL-XL) 25 MG 24 hr tablet, Take 1 tablet (25 mg total) by mouth daily. (Patient taking differently: Take 50 mg by mouth daily.), Disp: 90 tablet, Rfl: 3   Omega-3 Fatty Acids (FISH OIL) 1000 MG CAPS, Take 1,000 mg by mouth in the morning., Disp: , Rfl:    predniSONE (DELTASONE) 5 MG tablet, Take 5 mg by mouth daily with breakfast., Disp: , Rfl:    sennosides-docusate sodium (SENOKOT-S) 8.6-50 MG tablet, Take 1 tablet by mouth daily as needed for constipation., Disp: , Rfl:    traZODone (DESYREL) 50 MG tablet, Take 50 mg by mouth at bedtime., Disp: , Rfl:    valsartan-hydrochlorothiazide (DIOVAN-HCT) 320-25 MG tablet, Take 1 tablet by mouth daily., Disp: , Rfl:   Vitals   Vitals:   01/25/23 2000 01/25/23 2200 01/25/23 2215 01/25/23 2246  BP: (!) 169/62  (!) 197/76   Pulse: (!) 53 83 (!) 58   Resp: 17 (!) 21 15   Temp:    98.6 F (37 C)  TempSrc:      SpO2: 98% 93% 96%   Weight:      Height:        Body mass index is 28.57 kg/m.  Physical Exam   Constitutional: Appears younger than stated age, well-developed and well-nourished.   Neurologic Examination    Neuro: Mental Status: Patient is awake, alert, oriented to person, place, month, year, and situation. Patient is able to give a clear and coherent history. No signs of aphasia  He does have extinction to double simultaneous stimulation Cranial Nerves: II: Visual Fields are full.  No extinction to visual DSS pupils are equal, round, and reactive to light.   III,IV, VI: EOMI without ptosis or diploplia.  V: Facial sensation is symmetric to temperature VII: Facial movement is symmetric.  VIII: hearing is intact to voice X: Uvula elevates symmetrically XI: Shoulder shrug is symmetric. XII: tongue is midline without atrophy or fasciculations.  Motor: Tone is normal. Bulk is normal. 5/5 strength was present in all four extremities.   Sensory: Sensation is symmetric to light touch and temperature in the arms and legs.  He does extinguish to DSS Cerebellar: FNF intact bilaterally        Labs   CBC:  Recent Labs  Lab 01/25/23 1843 01/25/23 1852  WBC 6.3  --   NEUTROABS 4.1  --   HGB 12.6* 13.6  HCT 37.5* 40.0  MCV 94.0  --   PLT 177  --     Basic Metabolic Panel:  Lab Results  Component Value Date   NA 130 (L) 01/25/2023   K 4.3 01/25/2023   CO2 26 01/25/2023   GLUCOSE 92 01/25/2023   BUN 17 01/25/2023   CREATININE 1.00 01/25/2023   CALCIUM 9.5 01/25/2023   GFRNONAA >60 01/25/2023   Lipid Panel: No results found for: "LDLCALC" HgbA1c: No results found for: "HGBA1C"  Urine Drug Screen: No results found for: "LABOPIA", "COCAINSCRNUR", "LABBENZ", "AMPHETMU", "THCU", "LABBARB"  Alcohol Level     Component Value Date/Time   ETH <10 01/25/2023 1843   INR  Lab Results  Component Value Date   INR 1.2 01/25/2023   APTT  Lab Results  Component Value Date   APTT 33 01/25/2023   AED levels: No results found for: "PHENYTOIN", "ZONISAMIDE", "LAMOTRIGINE", "LEVETIRACETA"   CT Head without contrast(Personally reviewed): Right subtotal MCA infarct  MRI Brain(Personally reviewed): Moderate right MCA infarct  Impression   Jonathan Woodward is a 89 y.o. male with a history of atrial fibrillation on anticoagulation with Eliquis who presents with right MCA infarct.  Given the size of the infarct, I would favor holding Eliquis in the short-term, though anticoagulation will need to be the long-term disease modifying process.  I would also look for other possible etiologies of embolic disease such as carotid disease with CTA head and neck.  He will need to be admitted for PT/OT evaluation as well as secondary risk factor modification.  Recommendations  - HgbA1c, fasting lipid panel - Frequent neuro checks - Echocardiogram - CTA head and neck - Prophylactic therapy-Antiplatelet med: Aspirin -325  mg daily - Risk factor modification - Telemetry monitoring - PT consult, OT consult, Speech consult - Stroke team to follow  ______________________________________________________________________    Signed,  Ritta Slot

## 2023-01-25 NOTE — Assessment & Plan Note (Deleted)
Patient does not currently appear to be in Afib, but we cannot rule out the possibility of thrombi -continue home flecainide -continue home metoprolol -ECHO tomorrow morning

## 2023-01-25 NOTE — ED Triage Notes (Signed)
Patient arrives ambulatory by POV with daughter stating patient has been very clumsy, dropping things and having trouble with normal tasks over the past two days. Patient states he remembers feeling normal 2 days ago however family unsure of exact LKN. Patient alert to self, location, date just unsure of exactly why he is here. Daughter states patient took his BP meds about 30 minutes PTA.

## 2023-01-25 NOTE — H&P (Incomplete Revision)
Hospital Admission History and Physical Service Pager: [phone]  Patient name: Jonathan Woodward Medical record number: [id number] Date of Birth: 06-03-1932 Age: 89 y.o. Gender: male  Primary Care Provider: Lonie Peak, PA-C Consultants: Neurology Code Status: DNR which was confirmed with the patient Preferred Emergency Contact: Leotis Shames is primary emergency contact Contact Information     Name Relation Home Work Mobile   LINEBERRY,BOBBY Friend [phone]     Daryel November   [phone]   DIXON,JULIE Daughter   [phone]      Other Contacts   None on File      Chief Complaint: Confusion/ new onset difficulty with coordination  Assessment and Plan: Jonathan Woodward is a 89 y.o. male presenting with 2 days of coordination difficulty and confusion, likely due to CVA. Report from ED provider notes that the patient is very independent at baseline, and works extensively with his hands. In the ED, patient had a CT and MRI which showed substantial right MCA territory infarct. CTA ordered and pending due to suspicion for thrombus. CBC, CMP, coag studies and UA all unremarkable. EKG showed first degree heart block which was unchanged from prior studies.   Differential for this patient's presentation of this includes ischemic vs hemorrhagic stroke, substance intoxication, trauma, seizure, dementia, Parkinson's and neuropathy. Hemorrhagic stroke is less likely as there was no evidence of bleeding on the imaging studies, and the acute nature of his change from baseline makes dementia, neuropathy, and Parkinson's disease less likely as well. Blood alcohol level in the ED was <10.   Assessment & Plan Cerebrovascular accident (CVA), unspecified mechanism (HCC) Large right MCA territory infarct seen on imaging, neurology consulted appreciate their recommendations. -Admit to progressive care, Dr. Jennette Kettle is the attending -Hold Eliquis -CT angiogram ordered,  pending -q4h neuro checks -PT/OT eval -hold home antihypertensives pending neuro recommendations -AM BMP, Lipid panel, A1c Persistent atrial fibrillation (HCC) Patient does not currently appear to be in Afib, but we cannot rule out the possibility of thrombi -continue home flecainide -continue home metoprolol -ECHO tomorrow morning  Chronic and Stable Conditions: Osteoarthritis- PRN tylenol Rheumatoid arthritis- continue home methotrexate, prednisone, vitamin b12 and folic acid  FEN/GI: Heart Healthy VTE Prophylaxis: SCD  Disposition: Pending  History of Present Illness:  Jonathan Woodward is a 89 y.o. male presenting with 2 days of difficulty with fine motor coordination.  He reports noticing that he was having difficulty with tying his shoes yesterday, which progressed to difficulty holding things in his hands and dropping objects which is not normal for him.  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  His daughters provided some collateral information, including that he had not been acting like his usual self.  They report that he was saying things that he normally would not, but did not elaborate on what those things were.  They report that normally he is very goal and task oriented but has seemed less so since yesterday.  They also report that on the way back from the bathroom in the emergency department he was unable to walk in a straight line and was continually veering off to the right.  In the ED, he was found to be alert and oriented with no obvious deficits.  He had a CT head without contrast followed by an MRI which showed acute right MCA territory infarct.  Neurology consult was placed they recommended holding  his Eliquis and obtaining CT angiogram for further characterization.  Labs were obtained including CBC CMP coagulation studies UA and blood alcohol level.  Review Of Systems: Per  HPI.  Pertinent Past Medical History: Persistent Afib Remainder reviewed in history tab.   Pertinent Past Surgical History: None  Remainder reviewed in history tab.  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   Important Outpatient Medications: Eliquis Amlodipine Flecainide Metoprolol succinate Valsartan-hydrochlorothiazide  Remainder reviewed in medication history.   Objective: BP (!) 166/69   Pulse (!) 56   Temp 98.6 F (37 C)   Resp 15   Ht 5\' 6"  (1.676 m)   Wt 80.3 kg   SpO2 92%   BMI 28.57 kg/m  Exam: General: Alert, oriented to person, place, time and situation. No obvious distress Eyes: PERRLA EOMI, conjunctiva mildly injected ENTM: Patent nares, mucous membranes moist and without erythema or swelling.  Tongue is midline, good dentition. Neck: Supple, nontender.  Trachea midline Cardiovascular: Regular rate and rhythm, no M/R/G. Respiratory: CTAB, no increased work of breathing Gastrointestinal: Flat, soft, nontender MSK: Appropriate strength and tone Derm: 1.5 cm x 1.5 cm skin tear present on the lateral aspect of the left upper arm Neuro: Cranial nerves II through XII intact, 5 out of 5 strength in the bilateral upper and lower extremities.  Intact sensation to touch throughout.  Some left sided discoordination with finger-to-nose testing.  Adequate heel-to-shin bilaterally Psych: Appropriate mood and affect.  Labs:  CBC BMET  Recent Labs  Lab 01/25/23 1843 01/25/23 1852  WBC 6.3  --   HGB 12.6* 13.6  HCT 37.5* 40.0  PLT 177  --    Recent Labs  Lab 01/25/23 1843 01/25/23 1852  NA 132* 130*  K 4.3 4.3  CL 95* 96*  CO2 26  --   BUN 15 17  CREATININE 1.05 1.00  GLUCOSE 95 92  CALCIUM 9.5  --     Pertinent additional labs   Latest  Reference Range & Units 01/25/23 18:43  Prothrombin Time 11.4 - 15.2 seconds 15.6 (H)  INR 0.8 - 1.2  1.2  APTT 24 - 36 seconds 33  (H): Data is abnormally high  EKG: First-degree heart block no evidence of ST segment elevation.  Appears unchanged from prior study done on December 30, 2022  Imaging Studies Performed:  CT head without contrast IMPRESSION: Hypodensity in the posterior right MCA territory, consistent with acute infarct and edema from gyral swelling, as seen on the subsequent MRI.  MRI brain without contrast IMPRESSION: 1. Acute infarct in the posterior right MCA territory, involving the posterior right temporal lobe, posterior right frontal lobe, and anterior right parietal lobe. 2. A focus of hemosiderin deposition in the right posterior temporal lobe, which could indicate thrombus in a distal right MCA vessel versus sequela of prior microhemorrhage. 3. Additional punctate acute infarct in the left medial cerebellum.  My Interpretation: Probable ischemic type stroke of the right posterior MCA involving multiple lobes, evidence of possible thrombus in the distal right MCA.   Gerrit Heck, DO 01/25/2023, 11:37 PM PGY-1, [facility]  FPTS Intern pager: [phone], text pages welcome Secure chat group [facility] Teaching Service

## 2023-01-25 NOTE — ED Provider Notes (Signed)
[place] EMERGENCY DEPARTMENT AT [facility] Provider Note   CSN: [id number] Arrival date & time: 01/25/23  2725     History  Chief Complaint  Patient presents with   Altered Mental Status    Jonathan Woodward is a 89 y.o. male with a history of paroxysmal A-fib on Eliquis, flecainide, presenting to the emergency department with concern for confusion and difficulty with fine motor skills.  Patient's daughters are present at the bedside reports that typically he is "sharp as attack", and lives with a male partner, and he is extremely active, often tinkering on motors.  The patient notes that yesterday at some point in the morning or afternoon his hands were clumsy and he was repeatedly dropping things is unusual for him.  He also felt that he was running into her bumping into objects by walking which she said again was unusual.  Typically walks unassisted.  He reports he has a minor headache.  His family members at the bedside also report concern to the patient appears more confused, that he was "just wandering" which is unusual for him.  He has been compliant with all of his medications.  No prior history of stroke or TIA.  HPI     Home Medications Prior to Admission medications   Medication Sig Start Date End Date Taking? Authorizing Provider  amLODipine (NORVASC) 5 MG tablet Take 1 tablet (5 mg total) by mouth daily. 07/19/22 07/14/23  Croitoru, Mihai, MD  apixaban (ELIQUIS) 5 MG TABS tablet Take 1 tablet (5 mg total) by mouth 2 (two) times daily. 07/05/22   Cecil Cobbs, PA-C  Flaxseed, Linseed, (FLAXSEED OIL) 1000 MG CAPS Take 1,000 mg by mouth in the morning.    [provider]  flecainide (TAMBOCOR) 50 MG tablet Take 1 tablet (50 mg total) by mouth 2 (two) times daily. Please contact the office to schedule appointment for additional refills. 12/31/22   Croitoru, Mihai, MD  folic acid (FOLVITE) 400 MCG tablet Take 400 mcg by mouth daily.    [provider]  methotrexate (RHEUMATREX) 2.5 MG tablet Take 2.5 mg by mouth once a week. Take 4 tablets weekly 07/05/22   [provider]  metoprolol succinate (TOPROL-XL) 25 MG 24 hr tablet Take 1 tablet (25 mg total) by mouth daily. 12/18/21   Croitoru, Mihai, MD  Omega-3 Fatty Acids (FISH OIL) 1000 MG CAPS Take 1,000 mg by mouth in the morning.    [provider]  predniSONE (DELTASONE) 5 MG tablet Take 5 mg by mouth daily with breakfast.    [provider]  sennosides-docusate sodium (SENOKOT-S) 8.6-50 MG tablet Take 1 tablet by mouth daily.    [provider]  traZODone (DESYREL) 50 MG tablet Take 50 mg by mouth at bedtime.    [provider]  valsartan-hydrochlorothiazide (DIOVAN-HCT) 320-25 MG tablet Take 1 tablet by mouth daily. 01/11/23   [provider]      Allergies    Patient has no known allergies.    Review of Systems   Review of Systems  Physical Exam Updated Vital Signs BP (!) 169/62   Pulse (!) 53   Temp 98.6 F (37 C) (Oral)   Resp 17   Ht 5\' 6"  (1.676 m)   Wt 80.3 kg   SpO2 98%   BMI 28.57 kg/m  Physical Exam Constitutional:      General: He is not in acute distress. HENT:     Head: Normocephalic and atraumatic.  Eyes:     Conjunctiva/sclera: Conjunctivae normal.     Pupils: Pupils are equal, round, and reactive to light.  Cardiovascular:     Rate and Rhythm: Normal rate and regular rhythm.  Pulmonary:     Effort: Pulmonary effort is normal. No respiratory distress.  Abdominal:     General: There is no distension.     Tenderness: There is no abdominal tenderness.  Skin:    General: Skin is warm and dry.  Neurological:     General: No focal deficit present.     Mental Status: He is alert and oriented to person, place, and time. Mental status is at baseline.     Cranial Nerves: No cranial nerve deficit.     Sensory: No sensory deficit.     Motor: No weakness.     Coordination: Coordination  abnormal.     Comments: Difficulty with left hand finger-to-nose  Psychiatric:        Mood and Affect: Mood normal.        Behavior: Behavior normal.     ED Results / Procedures / Treatments   Labs (all labs ordered are listed, but only abnormal results are displayed) Labs Reviewed  PROTIME-INR - Abnormal; Notable for the following components:      Result Value   Prothrombin Time 15.6 (*)    All other components within normal limits  CBC - Abnormal; Notable for the following components:   RBC 3.99 (*)    Hemoglobin 12.6 (*)    HCT 37.5 (*)    All other components within normal limits  COMPREHENSIVE METABOLIC PANEL - Abnormal; Notable for the following components:   Sodium 132 (*)    Chloride 95 (*)    AST 14 (*)    All other components within normal limits  URINALYSIS, ROUTINE W REFLEX MICROSCOPIC - Abnormal; Notable for the following components:   Hgb urine dipstick TRACE (*)    All other components within normal limits  URINALYSIS, MICROSCOPIC (REFLEX) - Abnormal; Notable for the following components:   Bacteria, UA RARE (*)    All other components within normal limits  I-STAT CHEM 8, ED - Abnormal; Notable for the following components:   Sodium 130 (*)    Chloride 96 (*)    Calcium, Ion 1.12 (*)    All other components within normal limits  APTT  DIFFERENTIAL  ETHANOL  CBG MONITORING, ED    EKG EKG Interpretation Date/Time:  Tuesday January 25 2023 18:38:13 EDT Ventricular Rate:  53 PR Interval:  242 QRS Duration:  150 QT Interval:  440 QTC Calculation: 412 R Axis:   84  Text Interpretation: Sinus bradycardia with 1st degree A-V block Right bundle branch block Abnormal ECG When compared with ECG of 29-Dec-2021 10:28, PREVIOUS ECG IS PRESENT No significant changes Confirmed by Alvester Chou [phone]) on 01/25/2023 7:22:55 PM  Radiology CT HEAD WO CONTRAST  Result Date: 01/25/2023 CLINICAL DATA:  Altered mental status, stroke suspected EXAM: CT HEAD WITHOUT  CONTRAST TECHNIQUE: Contiguous axial images were obtained from the base of the skull through the vertex without intravenous contrast. RADIATION DOSE REDUCTION: This exam was performed according to the departmental dose-optimization program which includes automated exposure control, adjustment of the mA and/or kV according to patient size and/or use of iterative reconstruction technique. COMPARISON:  CT head 06/10/2008, correlation is also made with subsequent MRI head 01/25/2023 FINDINGS: Brain: Hypodensity in the posterior right MCA territory, involving the right posterior temporal, and posterior frontal, and anterior parietal  lobes, concerning for edema from gyral swelling. No evidence of acute hemorrhage, mass, mass effect, or midline shift. No hydrocephalus or extra-axial collection. Vascular: Focal calcification in right temporal lobe (series 2, image 46), which is nonspecific but could be vascular. No other hyperdense vessel. Skull: Negative for fracture or focal lesion. Sinuses/Orbits: Mild mucosal thickening in the paranasal sinuses. Status post bilateral lens replacements. Other: The mastoid air cells are well aerated. IMPRESSION: Hypodensity in the posterior right MCA territory, consistent with acute infarct and edema from gyral swelling, as seen on the subsequent MRI. These results were called by telephone at the time of interpretation on 01/25/2023 at 9:47 pm to provider Nguyen Butler , who verbally acknowledged these results. Electronically Signed   By: Wiliam Ke M.D.   On: 01/25/2023 21:51   MR BRAIN WO CONTRAST  Result Date: 01/25/2023 CLINICAL DATA:  Stroke suspected EXAM: MRI HEAD WITHOUT CONTRAST TECHNIQUE: Multiplanar, multiecho pulse sequences of the brain and surrounding structures were obtained without intravenous contrast. COMPARISON:  No prior MRI available, correlation is made with 01/25/2023 CT head FINDINGS: Brain: Restricted diffusion with ADC correlate in the posterior right  MCA territory, involving the posterior right temporal lobe, posterior right frontal lobe, and anterior right parietal lobe. This is associated with increased T2 hyperintense signal and gyral swelling. A focus of hemosiderin deposition is seen in the right posterior temporal lobe (series 4, image 61), which could indicate thrombus in a distal right MCA vessel. Additional punctate focus of restricted diffusion with ADC correlate in the left medial cerebellum (series 2, image 9). No acute hemorrhage, mass, mass effect, or midline shift. No hydrocephalus or extra-axial collection. Normal pituitary and craniocervical junction. Vascular: Normal proximal arterial flow voids. Skull and upper cervical spine: Normal marrow signal. Sinuses/Orbits: Clear paranasal sinuses. No acute finding in the orbits. Status post bilateral lens replacements. Other: The mastoid air cells are well aerated. IMPRESSION: 1. Acute infarct in the posterior right MCA territory, involving the posterior right temporal lobe, posterior right frontal lobe, and anterior right parietal lobe. 2. A focus of hemosiderin deposition in the right posterior temporal lobe, which could indicate thrombus in a distal right MCA vessel versus sequela of prior microhemorrhage. 3. Additional punctate acute infarct in the left medial cerebellum. These results were called by telephone at the time of interpretation on 01/25/2023 at 9:47 pm to provider Toryn Mcclinton , who verbally acknowledged these results. Electronically Signed   By: Wiliam Ke M.D.   On: 01/25/2023 21:49    Procedures Procedures    Medications Ordered in ED Medications - No data to display  ED Course/ Medical Decision Making/ A&P Clinical Course as of 01/25/23 2203  Tue Jan 25, 2023  2150 CT and MRI imaging showing concerns for right-sided MCA infarct, likely acute versus subacute.  I reassessed the patient at this time and his neurological status remains unchanged, with some very mild  confusion according to family but otherwise mentating well, no new neurological deficits or speech difficulties.  I have ordered CT angiogram imaging, will consult neurology for admission [MT]  2157 Dr Amada Jupiter neuro to see patient - advising holding eliquis - medicine admission - final recommendations pending.  Family updated [MT]    Clinical Course User Index [MT] Anesa Fronek, Kermit Balo, MD                                 Medical Decision Making Amount and/or Complexity  of Data Reviewed Labs: ordered. Radiology: ordered.  Risk Decision regarding hospitalization.   This patient presents to the ED with concern for mild confusion or altered mental status, balance difficulty, fine motor skill difficulty. This involves an extensive number of treatment options, and is a complaint that carries with it a high risk of complications and morbidity.  The differential diagnosis includes CVA versus TIA versus metabolic encephalopathy versus infection versus other  Co-morbidities that complicate the patient evaluation: History of A-fib at high risk of TIA or stroke  Additional history obtained from patient's daughters at bedside   I ordered and personally interpreted labs.  The pertinent results include: Mild hyponatremia, no emergent findings  I ordered imaging studies including CT head, MRI of the brain I independently visualized and interpreted imaging which showed right MCA territory stroke I agree with the radiologist interpretation  The patient was maintained on a cardiac monitor.  I personally viewed and interpreted the cardiac monitored which showed an underlying rhythm of: Sinus bradycardia  Per my interpretation the patient's ECG shows sinus bradycardia with prolonged PR interval, no other significant changes from prior EKG  I have reviewed the patients home medicines and have made adjustments as needed   I requested consultation with the neurology,  and discussed lab and imaging  findings as well as pertinent plan - they recommend: see ED course  After the interventions noted above, I reevaluated the patient and found that they have: stayed the same   Dispostion:  After consideration of the diagnostic results and the patients response to treatment, I feel that the patent would benefit from medical admission for stroke evaluation.         Final Clinical Impression(s) / ED Diagnoses Final diagnoses:  Cerebrovascular accident (CVA), unspecified mechanism [facility])    Rx / DC Orders ED Discharge Orders     None         Terald Sleeper, MD 01/25/23 2203

## 2023-01-26 ENCOUNTER — Other Ambulatory Visit (HOSPITAL_COMMUNITY): Payer: PPO

## 2023-01-26 DIAGNOSIS — I639 Cerebral infarction, unspecified: Secondary | ICD-10-CM | POA: Diagnosis not present

## 2023-01-26 DIAGNOSIS — I6340 Cerebral infarction due to embolism of unspecified cerebral artery: Secondary | ICD-10-CM | POA: Diagnosis not present

## 2023-01-26 DIAGNOSIS — I4819 Other persistent atrial fibrillation: Secondary | ICD-10-CM

## 2023-01-26 DIAGNOSIS — E785 Hyperlipidemia, unspecified: Secondary | ICD-10-CM | POA: Insufficient documentation

## 2023-01-26 LAB — HEMOGLOBIN A1C
Hgb A1c MFr Bld: 5.1 % (ref 4.8–5.6)
Mean Plasma Glucose: 99.67 mg/dL

## 2023-01-26 LAB — BASIC METABOLIC PANEL
Anion gap: 10 (ref 5–15)
BUN: 12 mg/dL (ref 8–23)
CO2: 27 mmol/L (ref 22–32)
Calcium: 9.4 mg/dL (ref 8.9–10.3)
Chloride: 94 mmol/L — ABNORMAL LOW (ref 98–111)
Creatinine, Ser: 0.93 mg/dL (ref 0.61–1.24)
GFR, Estimated: >60 mL/min (ref >60)
Glucose, Bld: 85 mg/dL (ref 70–99)
Potassium: 3.5 mmol/L (ref 3.5–5.1)
Sodium: 131 mmol/L — ABNORMAL LOW (ref 135–145)

## 2023-01-26 LAB — LIPID PANEL
Cholesterol: 196 mg/dL (ref 0–200)
HDL: 63 mg/dL (ref >40)
LDL Cholesterol: 115 mg/dL — ABNORMAL HIGH (ref 0–99)
Total CHOL/HDL Ratio: 3.1 {ratio}
Triglycerides: 91 mg/dL (ref <150)
VLDL: 18 mg/dL (ref 0–40)

## 2023-01-26 MED ORDER — ROSUVASTATIN CALCIUM 20 MG PO TABS
20.0000 mg | ORAL_TABLET | Freq: Every day | ORAL | Status: DC
  Administered 2023-01-26 – 2023-01-27 (×2): 20 mg via ORAL
  Filled 2023-01-26 (×2): qty 1

## 2023-01-26 MED ORDER — APIXABAN 5 MG PO TABS
5.0000 mg | ORAL_TABLET | Freq: Two times a day (BID) | ORAL | Status: DC
  Administered 2023-01-27: 5 mg via ORAL
  Filled 2023-01-26: qty 1

## 2023-01-26 MED ORDER — ASPIRIN 81 MG PO TBEC
81.0000 mg | DELAYED_RELEASE_TABLET | Freq: Every day | ORAL | Status: DC
  Administered 2023-01-27: 81 mg via ORAL
  Filled 2023-01-26: qty 1

## 2023-01-26 MED ORDER — MELATONIN 3 MG PO TABS
3.0000 mg | ORAL_TABLET | Freq: Every day | ORAL | Status: DC
  Administered 2023-01-27: 3 mg via ORAL
  Filled 2023-01-26: qty 1

## 2023-01-26 MED ORDER — POTASSIUM CHLORIDE CRYS ER 20 MEQ PO TBCR
40.0000 meq | EXTENDED_RELEASE_TABLET | Freq: Once | ORAL | Status: AC
  Administered 2023-01-26: 40 meq via ORAL
  Filled 2023-01-26: qty 2

## 2023-01-26 NOTE — Progress Notes (Signed)
Daily Progress Note Intern Pager: [phone]  Patient name: Jonathan Woodward Medical record number: [id number] Date of birth: 1932/07/30 Age: 89 y.o. Gender: male  Primary Care Provider: Lonie Peak, PA-C Consultants: Neurology Code Status: DNR  Pt Overview and Major Events to Date:  10/15: Admitted to FMTS service  Assessment and Plan: Jonathan Woodward is a 89 y.o. male presenting with 2 days of coordination difficulty and confusion secondary to CVA.  Today patient is alert and communicative with good mentation and clear understanding of what is going on. His daughter is present and very involved. Patient understands need to continue workup at this time to ensure he is medically stable and safe to discharge in the future. He is agreeable to starting prophylactic ASA, Crestor for HLD, and proceeding with Echocardiogram.   Patient with permissive HTN per Neurology recommendations. Home BP medications were initially held on admission but metoprolol was continued given pt with A fib. His BP has been in a lower range than expected here, with low normal HR; discussed with team that we would prefer to half the metoprolol dose to allow for permissive HTN and decrease risk of low HR, however by this time pt has already received daily dose of metoprolol. Tomorrow will be at least 48 hours outside of stroke window and so would plan anyway at that time to restart home dose metoprolol, therefore at this time will NOT make changes to metoprolol regimen. Anticipate restarting home BP medications tomorrow as well. Will await Neurology recommendations regarding restarting Eliquis given potential thrombus seen on brain MRI.  Pertinent PMH/PSH includes A-fib, hypertension.  Assessment & Plan CVA (cerebral vascular accident) (HCC) Large posterior right MCA territory infarct seen on CT head, MRI brain, and CTA head and neck.  Neurology consulted appreciate their recommendations. -Neurology  following, appreciate recommendations as below -Aspirin 325 mg daily prophylaxis -Follow-up echocardiogram -PT/OT/SLP eval -Holding home Eliquis for now, will await recommendations from neuro about when to restart. -holding home antihypertensives (amlodipine, Diovan HCTZ) -AM BMP, Lipid panel, A1c - replete K today (goal >4) Hyperlipidemia LDL 115.  Patient not currently on a statin. - Start Crestor 20mg  daily  Chronic and Stable Conditions: A-fib: Holding Eliquis, continue flecainide and metoprolol. K+ goal >4. HTN: Holding home medications as above Osteoarthritis: PRN tylenol Rheumatoid arthritis: continue home methotrexate, prednisone, vitamin b12 and folic acid   FEN/GI: Heart healthy diet PPx: CDs Dispo:Pending PT recommendations  pending clinical improvement . Barriers include clinical improvement.   Subjective:  Patient seen this morning lying in bed with daughter at bedside.  He states he is doing well and ready to return home.  He denies pain or difficulty with daily functions.  His daughter does raise some concerns and encourages him to pursue further workup as recommended.  In particular his daughter is concerned that he desires to drive.  Objective: Temp:  [98 F (36.7 C)-98.6 F (37 C)] 98.2 F (36.8 C) (10/16 1115) Pulse Rate:  [50-83] 69 (10/16 0810) Resp:  [14-26] 18 (10/16 1115) BP: (126-197)/(62-76) 161/63 (10/16 1115) SpO2:  [92 %-100 %] 99 % (10/16 0810) Weight:  [80.3 kg-80.5 kg] 80.5 kg (10/16 0528) Physical Exam: General: Alert, pleasant, no acute distress. Cardiovascular: RRR, no murmurs. Respiratory: CTA bilaterally, no increased work of breathing on room air. Abdomen: Normoactive bowel sounds, soft, non-tender.  No masses. Neuro: CN II: PERRL CN III, IV,VI: EOMI CVII: Symmetric smile and brow raise CN VIII: Normal hearing UE and LE strength 5/5 Normal  sensation in UE and LE to unilateral light touch, however does demonstrate extinction with  bilateral stimulation.  Laboratory: Most recent CBC Lab Results  Component Value Date   WBC 6.3 01/25/2023   HGB 13.6 01/25/2023   HCT 40.0 01/25/2023   MCV 94.0 01/25/2023   PLT 177 01/25/2023   Most recent BMP    Latest Ref Rng & Units 01/26/2023    5:07 AM  BMP  Glucose 70 - 99 mg/dL 85   BUN 8 - 23 mg/dL 12   Creatinine 1.61 - 1.24 mg/dL 0.96   Sodium 045 - 409 mmol/L 131   Potassium 3.5 - 5.1 mmol/L 3.5   Chloride 98 - 111 mmol/L 94   CO2 22 - 32 mmol/L 27   Calcium 8.9 - 10.3 mg/dL 9.4    W1X 5.1 LDL 914  Imaging/Diagnostic Tests: CTA head and neck: 1. Focal calcification in a distal right MCA branch, with severe stenosis/poor opacification of its branch vessels, concerning for acute occlusion as these vessels appear to supply the area of acute infarct in the posterior right MCA territory. 2. 50% stenosis in the bilateral proximal ICAs. 3. 60% stenosis in the proximal brachiocephalic artery. 4. Moderate stenosis at the origin of the left vertebral artery and mild stenosis at the origin of the right vertebral artery. 5. Aortic atherosclerosis.  Cyndia Skeeters, DO 01/26/2023, 11:20 AM  PGY-1, [facility] FPTS Intern pager: [phone], text pages welcome Secure chat group [facility] Teaching Service

## 2023-01-26 NOTE — Evaluation (Signed)
Physical Therapy Evaluation Patient Details Name: Jonathan Woodward MRN: [id number] DOB: 1932/10/08 Today's Date: 01/26/2023  History of Present Illness  Pt is an 89 y/o M presenting to ED on 10/15 with AMS and impaired fine motor skills, large posterior R MCA territory infarct noted on imaging. PMH includes paroxysmal A fib on Eliquis  Clinical Impression  Pt doing well from a PT standpoint and mobility is close to baseline per pt and family. Pt is very active at home including using a chain saw, climbing ladders, and getting down on the floor. Given pt's prior activity level recommend OPPT. Pt and family aware that if they feel pt is at baseline when they return home they can cancel this appointment. They are also aware of recommendation for OT at outpatient and understand that pt demonstrating some cognitive deficits. From PT standpoint pt okay for home when medically ready.         If plan is discharge home, recommend the following: Assist for transportation   Can travel by private vehicle        Equipment Recommendations None recommended by PT  Recommendations for Other Services       Functional Status Assessment Patient has had a recent decline in their functional status and demonstrates the ability to make significant improvements in function in a reasonable and predictable amount of time.     Precautions / Restrictions Precautions Precautions: Fall Restrictions Weight Bearing Restrictions: No      Mobility  Bed Mobility Overal bed mobility: Modified Independent                  Transfers Overall transfer level: Needs assistance Equipment used: None Transfers: Sit to/from Stand Sit to Stand: Supervision                Ambulation/Gait Ambulation/Gait assistance: Supervision Gait Distance (Feet): 600 Feet Assistive device: None Gait Pattern/deviations: Step-through pattern, Drifts right/left Gait velocity: normal Gait velocity interpretation:  >2.62 ft/sec, indicative of community ambulatory   General Gait Details: Slight drift initially with change of direction but pt able to self correct. Stability and ease improved with incr distance.  Stairs Stairs: Yes Stairs assistance: Supervision Stair Management: One rail Right, Alternating pattern, Forwards Number of Stairs: 3    Wheelchair Mobility     Tilt Bed    Modified Rankin (Stroke Patients Only) Modified Rankin (Stroke Patients Only) Pre-Morbid Rankin Score: No symptoms Modified Rankin: Moderate disability     Balance Overall balance assessment: Needs assistance Sitting-balance support: Feet supported Sitting balance-Leahy Scale: Good     Standing balance support: During functional activity Standing balance-Leahy Scale: Good               High level balance activites: Direction changes, Turns, Head turns High Level Balance Comments: Performed all of the above without loss of balance             Pertinent Vitals/Pain      Home Living Family/patient expects to be discharged to:: Private residence Living Arrangements: Spouse/significant other Available Help at Discharge: Family Type of Home: House Home Access: Stairs to enter Entrance Stairs-Rails: Left Entrance Stairs-Number of Steps: 2   Home Layout: One level Home Equipment: Shower seat;Toilet riser;Rolling Walker (2 wheels)      Prior Function Prior Level of Function : Independent/Modified Independent;History of Falls (last six months);Driving             Mobility Comments: no AD use. Climbs ladders, uses chainsaws, crawls under buildings ADLs  Comments: ind, ind with med mgmt     Extremity/Trunk Assessment   Upper Extremity Assessment Upper Extremity Assessment: Defer to OT evaluation    Lower Extremity Assessment Lower Extremity Assessment: Overall WFL for tasks assessed    Cervical / Trunk Assessment Cervical / Trunk Assessment: Normal  Communication    Communication Communication: No apparent difficulties  Cognition Arousal: Alert Behavior During Therapy: WFL for tasks assessed/performed Overall Cognitive Status: Impaired/Different from baseline Area of Impairment: Orientation, Memory, Attention, Following commands, Safety/judgement, Awareness, Problem solving                 Orientation Level: Situation Current Attention Level: Focused Memory: Decreased short-term memory Following Commands: Follows one step commands consistently Safety/Judgement: Decreased awareness of deficits, Decreased awareness of safety Awareness: Emergent Problem Solving: Requires verbal cues, Difficulty sequencing          General Comments      Exercises     Assessment/Plan    PT Assessment Patient needs continued PT services  PT Problem List Decreased mobility;Decreased balance       PT Treatment Interventions Gait training    PT Goals (Current goals can be found in the Care Plan section)  Acute Rehab PT Goals Patient Stated Goal: go home and return to prior level of activity PT Goal Formulation: With patient/family Time For Goal Achievement: 02/02/23 Potential to Achieve Goals: Good Additional Goals Additional Goal #1: Pt will demonstrate floor transfer with supervision    Frequency Min 1X/week     Co-evaluation               AM-PAC PT "6 Clicks" Mobility  Outcome Measure                  End of Session   Activity Tolerance: Patient tolerated treatment well Patient left: in bed;with call bell/phone within reach;with bed alarm set;with family/visitor present   PT Visit Diagnosis: Other abnormalities of gait and mobility (R26.89)    Time: 1533-1600 PT Time Calculation (min) (ACUTE ONLY): 27 min   Charges:   PT Evaluation $PT Eval Low Complexity: 1 Low PT Treatments $Gait Training: 8-22 mins PT General Charges $$ ACUTE PT VISIT: 1 Visit         [facility] PT Acute Rehabilitation Services Office  [phone]   Angelina Ok [facility] 01/26/2023, 4:18 PM

## 2023-01-26 NOTE — H&P (Incomplete)
Hospital Admission History and Physical Service Pager: [phone]  Patient name: Jonathan Woodward Medical record number: [id number] Date of Birth: Aug 13, 1932 Age: 89 y.o. Gender: male  Primary Care Provider: Lonie Peak, PA-C Consultants: Neurology Code Status: DNR which was confirmed with the patient Preferred Emergency Contact: Leotis Shames is primary emergency contact Contact Information     Name Relation Home Work Mobile   LINEBERRY,BOBBY Friend [phone]     Daryel November   [phone]   DIXON,JULIE Daughter   [phone]      Other Contacts   None on File     Chief Complaint: Confusion/ new onset difficulty with coordination  Assessment and Plan: Jonathan Woodward is a 89 y.o. male presenting with 2 days of coordination difficulty and confusion, likely due to CVA. Report from ED provider notes that the patient is very independent at baseline, and works extensively with his hands. In the ED, patient had a CT and MRI which showed substantial right MCA territory infarct. CTA ordered and pending due to suspicion for thrombus. CBC, CMP, coag studies and UA all unremarkable. EKG showed first degree heart block which was unchanged from prior studies.   Differential for this patient's presentation of this includes ischemic vs hemorrhagic stroke, substance intoxication, trauma, seizure, dementia, Parkinson's and neuropathy. Hemorrhagic stroke is less likely as there was no evidence of bleeding on the imaging studies, and the acute nature of his change from baseline makes dementia, neuropathy, and Parkinson's disease less likely as well. Blood alcohol level in the ED was <10.   Assessment & Plan Cerebrovascular accident (CVA), unspecified mechanism (HCC) Large posterior right MCA territory infarct seen on CT head and MRI brain, neurology consulted appreciate their recommendations. -Admit to FMTS, progressive care, attending Dr. Jennette Kettle -Neurology following,  appreciate recommendations -Hold Eliquis -Follow-up CTA head and neck -Follow-up echocardiogram -q4h neuro checks -PT/OT eval -hold home antihypertensives pending neuro recommendations -AM BMP, Lipid panel, A1c Persistent atrial fibrillation (HCC) Patient does not currently appear to be in Afib, but we cannot rule out the possibility of thrombi -continue home flecainide -continue home metoprolol -ECHO tomorrow morning  Chronic and Stable Conditions: Osteoarthritis- PRN tylenol Rheumatoid arthritis- continue home methotrexate, prednisone, vitamin b12 and folic acid  FEN/GI: Heart Healthy VTE Prophylaxis: SCD  Disposition: Pending  History of Present Illness:  Jonathan Woodward is a 89 y.o. male presenting with 2 days of difficulty with fine motor coordination.  He reports noticing that he was having difficulty with tying his shoes yesterday, which progressed to difficulty holding things in his hands and dropping objects which is not normal for him.  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  His daughters provided some collateral information, including that he had not been acting like his usual self.  They report that he was saying things that he normally would not, but did not elaborate on what those things were.  They report that normally he is very goal and task oriented but has seemed less so since yesterday.  They also report that on the way back from the bathroom in the emergency department he was unable to walk in a straight line and was continually veering off to the right.  In the ED, he was found to be alert and oriented with no obvious deficits.  He had a CT head without contrast followed by an MRI which showed acute right MCA  territory infarct.  Neurology consult was placed they recommended holding his Eliquis and obtaining CT angiogram for further characterization.  Labs were obtained  including CBC CMP coagulation studies UA and blood alcohol level.  Review Of Systems: Per HPI.  Pertinent Past Medical History: Persistent Afib Remainder reviewed in history tab.   Pertinent Past Surgical History: None  Remainder reviewed in history tab.  Pertinent Social History: Tobacco use: former, quit in the 80s, primarily chewing tobacco Alcohol use: none Other Substance use: none Lives with partner, Reita Cliche, on a 15 acre farm  Pertinent Family History: None  Remainder reviewed in history tab.   Important Outpatient Medications: Eliquis Amlodipine Flecainide Metoprolol succinate Valsartan-hydrochlorothiazide  Remainder reviewed in medication history.   Objective: BP (!) 166/69   Pulse (!) 56   Temp 98.6 F (37 C)   Resp 15   Ht 5\' 6"  (1.676 m)   Wt 80.3 kg   SpO2 92%   BMI 28.57 kg/m  Exam: General: Alert, oriented to person, place, time and situation. No obvious distress Eyes: PERRLA EOMI, conjunctiva mildly injected ENTM: Patent nares, mucous membranes moist and without erythema or swelling.  Tongue is midline, good dentition. Neck: Supple, nontender.  Trachea midline Cardiovascular: Regular rate and rhythm, no M/R/G. Respiratory: CTAB, no increased work of breathing Gastrointestinal: Flat, soft, nontender MSK: Appropriate strength and tone Derm: 1.5 cm x 1.5 cm skin tear present on the lateral aspect of the left upper arm Neuro: Cranial nerves II through XII intact, 5 out of 5 strength in the bilateral upper and lower extremities.  Intact sensation to touch throughout.  Some left sided discoordination with finger-to-nose testing.  Adequate heel-to-shin bilaterally Psych: Appropriate mood and affect.  Labs:  CBC BMET  Recent Labs  Lab 01/25/23 1843 01/25/23 1852  WBC 6.3  --   HGB 12.6* 13.6  HCT 37.5* 40.0  PLT 177  --    Recent Labs  Lab 01/25/23 1843 01/25/23 1852  NA 132* 130*  K 4.3 4.3  CL 95* 96*  CO2 26  --   BUN 15 17   CREATININE 1.05 1.00  GLUCOSE 95 92  CALCIUM 9.5  --     Pertinent additional labs   Latest Reference Range & Units 01/25/23 18:43  Prothrombin Time 11.4 - 15.2 seconds 15.6 (H)  INR 0.8 - 1.2  1.2  APTT 24 - 36 seconds 33  (H): Data is abnormally high  EKG: First-degree heart block no evidence of ST segment elevation.  Appears unchanged from prior study done on December 30, 2022  Imaging Studies Performed:  CT head without contrast IMPRESSION: Hypodensity in the posterior right MCA territory, consistent with acute infarct and edema from gyral swelling, as seen on the subsequent MRI.  MRI brain without contrast IMPRESSION: 1. Acute infarct in the posterior right MCA territory, involving the posterior right temporal lobe, posterior right frontal lobe, and anterior right parietal lobe. 2. A focus of hemosiderin deposition in the right posterior temporal lobe, which could indicate thrombus in a distal right MCA vessel versus sequela of prior microhemorrhage. 3. Additional punctate acute infarct in the left medial cerebellum.  My Interpretation: Probable ischemic type stroke of the right posterior MCA involving multiple lobes, evidence of possible thrombus in the distal right MCA.   Gerrit Heck, DO 01/25/2023, 11:37 PM PGY-1, [facility]  FPTS Intern pager: [phone], text pages welcome Secure chat group [facility] Teaching Service

## 2023-01-26 NOTE — Hospital Course (Addendum)
Jonathan Woodward is a 89 y.o.male with a history of A-fib, hypertension, osteoarthritis rheumatoid arthritis who was admitted to the [facility] Medicine Teaching Service at [facility] for new onset confusion and difficulty with coordination secondary to CVA.   His hospital course is detailed below:  CVA Presented with 2 days of coordination difficulty and confusion. He had a CT head without contrast followed by an MRI which showed acute right MCA territory infarct.  Neurology was consulted, recommended holding his Eliquis and obtaining CT angiogram for further characterization, which showed concern for acute occlusion of right distal MCA.  Echocardiogram obtained to assess for possible origin of clot; this showed grade 1 diastolic dysfunction with LVEF 65 to 70% but no intracardiac source of embolism.  Considered TEE for further workup, however on discussion with neurology agreed management would not change if TEE was obtained. Neurology recommended restarting Eliquis and starting ASA 81mg  daily prior to discharge. No significant deficits with strength or coordination at discharge, but did recommend home health PT/OT given his independent activity level prior to admission.  Hyperlipidemia LDL 115.  Started on Crestor 20 mg daily.  Other chronic conditions were medically managed with home medications and formulary alternatives as necessary: A-fib: Held Eliquis initially but this was restarted prior to discharge; continued home flecainide and metoprolol. HTN: Initially held all home medications, but restarted amlodipine prior to discharge.  Continued to hold Diovan HCTZ as patient had some low pressures during hospitalization. Osteoarthritis: PRN tylenol Rheumatoid arthritis: continued home methotrexate, prednisone, vitamin b12 and folic acid   PCP Follow-up Recommendations: Follow-up cholesterol labs and continue statin therapy as indicated Follow-up outpatient stroke clinic at [facility] in about 4  weeks Follow up BP and add back diovan as needed

## 2023-01-26 NOTE — ED Notes (Signed)
Per floor a bed needs to be ordered for admission room.

## 2023-01-26 NOTE — Assessment & Plan Note (Addendum)
LDL 115.  Patient not currently on a statin. - Start Crestor 20mg  daily

## 2023-01-26 NOTE — Assessment & Plan Note (Addendum)
Large posterior right MCA territory infarct seen on CT head, MRI brain, and CTA head and neck.  Neurology consulted appreciate their recommendations. -Neurology following, appreciate recommendations as below -Aspirin 325 mg daily prophylaxis -Follow-up echocardiogram -PT/OT/SLP eval -Holding home Eliquis for now, will await recommendations from neuro about when to restart. -holding home antihypertensives (amlodipine, Diovan HCTZ) -AM BMP, Lipid panel, A1c - replete K today (goal >4)

## 2023-01-26 NOTE — ED Notes (Signed)
Pt called out needing to use restroom.  Pt ambulated by EDT and family with steady gait to bathroom.

## 2023-01-26 NOTE — Evaluation (Signed)
Occupational Therapy Evaluation Patient Details Name: Jonathan Woodward MRN: [id number] DOB: 04/30/1932 Today's Date: 01/26/2023   History of Present Illness Pt is an 89 y/o M presenting to ED on 10/15 with AMS and impaired fine motor skills, large posterior R MCA territory infarct noted on imaging. PMH includes paroxysmal A fib on Eliquis   Clinical Impression   Pt reports ind at baseline with ADLs/functional mobility, lives with spouse but has family present who can assist. Pt currently with mild L weakness/sensation changes, inattention to L side, and decr cognition. Difficulty with reverse sequencing tasks and noted visual/perceptual deficits with clock draw (see vision section comments). Pt completing ADLs set up - min A, mod I for bed mobility and CGA for transfers without AD. Pt presenting with impairments listed below, will follow acutely. Recommend OP OT at d/c.        If plan is discharge home, recommend the following: A little help with walking and/or transfers;A little help with bathing/dressing/bathroom;Assistance with cooking/housework;Direct supervision/assist for medications management;Direct supervision/assist for financial management;Assist for transportation;Help with stairs or ramp for entrance;Supervision due to cognitive status    Functional Status Assessment  Patient has had a recent decline in their functional status and demonstrates the ability to make significant improvements in function in a reasonable and predictable amount of time.  Equipment Recommendations  None recommended by OT (pt has all needed DME)    Recommendations for Other Services PT consult     Precautions / Restrictions Precautions Precautions: Fall Restrictions Weight Bearing Restrictions: No      Mobility Bed Mobility Overal bed mobility: Modified Independent                  Transfers Overall transfer level: Needs assistance Equipment used: None Transfers: Sit to/from  Stand Sit to Stand: Contact guard assist           General transfer comment: cues for pacing      Balance Overall balance assessment: Needs assistance Sitting-balance support: Feet supported Sitting balance-Leahy Scale: Good     Standing balance support: During functional activity Standing balance-Leahy Scale: Fair Standing balance comment: mild unsteadiness/anterior lean, cues for pacing                           ADL either performed or assessed with clinical judgement   ADL Overall ADL's : Needs assistance/impaired Eating/Feeding: Set up   Grooming: Set up   Upper Body Bathing: Minimal assistance   Lower Body Bathing: Minimal assistance   Upper Body Dressing : Minimal assistance   Lower Body Dressing: Minimal assistance   Toilet Transfer: Contact guard assist   Toileting- Clothing Manipulation and Hygiene: Supervision/safety       Functional mobility during ADLs: Contact guard assist       Vision Baseline Vision/History: 0 No visual deficits Vision Assessment?: Yes Eye Alignment: Within Functional Limits Ocular Range of Motion: Within Functional Limits Alignment/Gaze Preference: Within Defined Limits Tracking/Visual Pursuits: Decreased smoothness of eye movement to LEFT superior field;Decreased smoothness of eye movement to LEFT inferior field Convergence: Within functional limits Visual Fields: Left visual field deficit;Impaired-to be further tested in functional context Additional Comments: pt with decr tracking to L visual field, when presented wtih bil stimuli, pt only identifying stimuli on R side, pt needing cues to draw on L side of paper when performing clock draw, writes all numbers on clock but in reverse order (ex 12 at top and then 11-1 on R  side of clock)     Perception Perception: Not tested       Praxis Praxis: Not tested       Pertinent Vitals/Pain Pain Assessment Pain Assessment: No/denies pain     Extremity/Trunk  Assessment Upper Extremity Assessment Upper Extremity Assessment: Right hand dominant;LUE deficits/detail LUE Deficits / Details: 4/5 ROM/strength, decr sensation to hand when tested with vision occluded, slow digit opposition compared to RUE LUE Sensation: decreased light touch LUE Coordination: decreased fine motor   Lower Extremity Assessment Lower Extremity Assessment: Defer to PT evaluation   Cervical / Trunk Assessment Cervical / Trunk Assessment: Normal   Communication Communication Communication: No apparent difficulties   Cognition Arousal: Alert Behavior During Therapy: WFL for tasks assessed/performed Overall Cognitive Status: Impaired/Different from baseline Area of Impairment: Orientation, Memory, Attention, Following commands, Safety/judgement, Awareness, Problem solving                 Orientation Level: Situation Current Attention Level: Focused Memory: Decreased short-term memory Following Commands: Follows one step commands consistently Safety/Judgement: Decreased awareness of deficits, Decreased awareness of safety Awareness: Emergent Problem Solving: Requires verbal cues, Difficulty sequencing General Comments: errors during short blessed test, missed 3 #s when counting backward and unable to state months of the year in reverse order. Decr awareness of deficits     General Comments  VSS, family present and supportive    Exercises     Shoulder Instructions      Home Living Family/patient expects to be discharged to:: Private residence Living Arrangements: Spouse/significant other Available Help at Discharge: Family Type of Home: House Home Access: Stairs to enter Secretary/administrator of Steps: 2 Entrance Stairs-Rails: Left Home Layout: One level     Bathroom Shower/Tub: Runner, broadcasting/film/video: Shower seat;Toilet riser;Rolling Walker (2 wheels)          Prior Functioning/Environment Prior Level of Function :  Independent/Modified Independent;History of Falls (last six months);Driving             Mobility Comments: no AD use ADLs Comments: ind, ind with med mgmt        OT Problem List: Decreased strength;Decreased range of motion;Decreased activity tolerance;Impaired balance (sitting and/or standing);Impaired vision/perception;Decreased coordination;Decreased cognition;Decreased safety awareness      OT Treatment/Interventions: Self-care/ADL training;Energy conservation;DME and/or AE instruction;Therapeutic activities;Patient/family education;Balance training;Cognitive remediation/compensation;Visual/perceptual remediation/compensation;Neuromuscular education;Therapeutic exercise    OT Goals(Current goals can be found in the care plan section) Acute Rehab OT Goals Patient Stated Goal: none stated OT Goal Formulation: With patient Time For Goal Achievement: 02/09/23 Potential to Achieve Goals: Good ADL Goals Pt Will Perform Tub/Shower Transfer: Shower transfer;Independently;ambulating Pt/caregiver will Perform Home Exercise Program: With written HEP provided;With Supervision;Left upper extremity Additional ADL Goal #1: pt will recieve passing score on pillbox assessment in order to promote ind with med mgmt Additional ADL Goal #2: pt will demonstrate compensatory strategies to attend to L side in order to promote ind with ADLs  OT Frequency: Min 1X/week    Co-evaluation              AM-PAC OT "6 Clicks" Daily Activity     Outcome Measure Help from another person eating meals?: None Help from another person taking care of personal grooming?: A Little Help from another person toileting, which includes using toliet, bedpan, or urinal?: A Little Help from another person bathing (including washing, rinsing, drying)?: A Little Help from another person to put on and taking off regular upper body  clothing?: A Little Help from another person to put on and taking off regular lower body  clothing?: A Little 6 Click Score: 19   End of Session Equipment Utilized During Treatment: Gait belt Nurse Communication: Mobility status  Activity Tolerance: Patient tolerated treatment well Patient left: in bed;with call bell/phone within reach;with bed alarm set;with family/visitor present  OT Visit Diagnosis: Unsteadiness on feet (R26.81);Other abnormalities of gait and mobility (R26.89);Muscle weakness (generalized) (M62.81);Other symptoms and signs involving cognitive function;Other symptoms and signs involving the nervous system (R29.898)                Time: 1610-9604 OT Time Calculation (min): 36 min Charges:  OT General Charges $OT Visit: 1 Visit OT Evaluation $OT Eval Moderate Complexity: 1 Mod OT Treatments $Therapeutic Activity: 8-22 mins  Carver Fila, OTD, OTR/L SecureChat Preferred Acute Rehab [phone]   Lona Six K Koonce 01/26/2023, 10:45 AM

## 2023-01-26 NOTE — ED Notes (Signed)
ED TO INPATIENT HANDOFF REPORT  ED Nurse Name and Phone #: Jaquelyn Bitter [phone]  S Name/Age/Gender Jonathan Woodward 89 y.o. male Room/Bed: 006C/006C  Code Status   Code Status: Limited: Do not attempt resuscitation (DNR) -DNR-LIMITED -Do Not Intubate/DNI   Home/SNF/Other Home Patient oriented to: self, place, time, and situation Is this baseline? Yes   Triage Complete: Triage complete  Chief Complaint CVA (cerebral vascular accident) [facility]) [I63.9]  Triage Note Patient arrives ambulatory by POV with daughter stating patient has been very clumsy, dropping things and having trouble with normal tasks over the past two days. Patient states he remembers feeling normal 2 days ago however family unsure of exact LKN. Patient alert to self, location, date just unsure of exactly why he is here. Daughter states patient took his BP meds about 30 minutes PTA.    Allergies No Known Allergies  Level of Care/Admitting Diagnosis ED Disposition     ED Disposition  Admit   Condition  --   Comment  Hospital Area: [facility] [[id number]]  Level of Care: Progressive [102]  Admit to Progressive based on following criteria: NEUROLOGICAL AND NEUROSURGICAL complex patients with significant risk of instability, who do not meet ICU criteria, yet require close observation or frequent assessment (< / = every 2 - 4 hours) with medical / nursing intervention.  May admit patient to [facility] or [facility] if equivalent level of care is available:: No  Covid Evaluation: Asymptomatic - no recent exposure (last 10 days) testing not required  Diagnosis: CVA (cerebral vascular accident) [facility]) [[id number]]  Admitting Physician: Gerrit Heck [[id number]]  Attending Physician: Nestor Ramp [[id number]]  Certification:: I certify this patient will need inpatient services for at least 2 midnights  Expected Medical Readiness: 01/28/2023          B Medical/Surgery History Past Medical History:   Diagnosis Date   Arthritis    Rheumatoid Arthritis   Atrial fibrillation (HCC)    Dysrhythmia    Glaucoma    HTN (hypertension)    Past Surgical History:  Procedure Laterality Date   CARDIOVERSION N/A 10/16/2021   Procedure: CARDIOVERSION;  Surgeon: Chrystie Nose, MD;  Location: [facility] ENDOSCOPY;  Service: Cardiovascular;  Laterality: N/A;   CARDIOVERSION N/A 12/29/2021   Procedure: CARDIOVERSION;  Surgeon: Parke Poisson, MD;  Location: [facility] ENDOSCOPY;  Service: Cardiovascular;  Laterality: N/A;   CATARACT EXTRACTION     TOTAL KNEE ARTHROPLASTY Left 03/03/2022   Procedure: TOTAL KNEE ARTHROPLASTY;  Surgeon: Joen Laura, MD;  Location: [facility] ORS;  Service: Orthopedics;  Laterality: Left;   TOTAL KNEE ARTHROPLASTY Right 07/02/2022   Procedure: TOTAL KNEE ARTHROPLASTY;  Surgeon: Joen Laura, MD;  Location: [facility] ORS;  Service: Orthopedics;  Laterality: Right;     A IV Location/Drains/Wounds Patient Lines/Drains/Airways Status     Active Line/Drains/Airways     Name Placement date Placement time Site Days   Peripheral IV 01/25/23 20 G Left Antecubital 01/25/23  1957  Antecubital  1            Intake/Output Last 24 hours No intake or output data in the 24 hours ending 01/26/23 0331  Labs/Imaging Results for orders placed or performed during the hospital encounter of 01/25/23 (from the past 48 hour(s))  Protime-INR     Status: Abnormal   Collection Time: 01/25/23  6:43 PM  Result Value Ref Range   Prothrombin Time 15.6 (H) 11.4 - 15.2 seconds   INR 1.2 0.8 - 1.2  Comment: (NOTE) INR goal varies based on device and disease states. Performed at [facility], [address], [place], [place] [address]   APTT     Status: None   Collection Time: 01/25/23  6:43 PM  Result Value Ref Range   aPTT 33 24 - 36 seconds    Comment: Performed at [facility], [address] [place], [place] [address]  CBC     Status: Abnormal   Collection Time:  01/25/23  6:43 PM  Result Value Ref Range   WBC 6.3 4.0 - 10.5 K/uL   RBC 3.99 (L) 4.22 - 5.81 MIL/uL   Hemoglobin 12.6 (L) 13.0 - 17.0 g/dL   HCT 29.5 (L) 62.1 - 30.8 %   MCV 94.0 80.0 - 100.0 fL   MCH 31.6 26.0 - 34.0 pg   MCHC 33.6 30.0 - 36.0 g/dL   RDW 65.7 84.6 - 96.2 %   Platelets 177 150 - 400 K/uL   nRBC 0.0 0.0 - 0.2 %    Comment: Performed at [facility], [address] [place], [place] [address]  Differential     Status: None   Collection Time: 01/25/23  6:43 PM  Result Value Ref Range   Neutrophils Relative % 63 %   Neutro Abs 4.1 1.7 - 7.7 K/uL   Lymphocytes Relative 22 %   Lymphs Abs 1.4 0.7 - 4.0 K/uL   Monocytes Relative 12 %   Monocytes Absolute 0.8 0.1 - 1.0 K/uL   Eosinophils Relative 2 %   Eosinophils Absolute 0.1 0.0 - 0.5 K/uL   Basophils Relative 1 %   Basophils Absolute 0.0 0.0 - 0.1 K/uL   Immature Granulocytes 0 %   Abs Immature Granulocytes 0.02 0.00 - 0.07 K/uL    Comment: Performed at [facility], [address] [place], [place] [address]  Comprehensive metabolic panel     Status: Abnormal   Collection Time: 01/25/23  6:43 PM  Result Value Ref Range   Sodium 132 (L) 135 - 145 mmol/L   Potassium 4.3 3.5 - 5.1 mmol/L   Chloride 95 (L) 98 - 111 mmol/L   CO2 26 22 - 32 mmol/L   Glucose, Bld 95 70 - 99 mg/dL    Comment: Glucose reference range applies only to samples taken after fasting for at least 8 hours.   BUN 15 8 - 23 mg/dL   Creatinine, Ser 0.10 0.61 - 1.24 mg/dL   Calcium 9.5 8.9 - 27.2 mg/dL   Total Protein 6.7 6.5 - 8.1 g/dL   Albumin 3.7 3.5 - 5.0 g/dL   AST 14 (L) 15 - 41 U/L   ALT 15 0 - 44 U/L   Alkaline Phosphatase 87 38 - 126 U/L   Total Bilirubin 0.5 0.3 - 1.2 mg/dL   GFR, Estimated >53 >66 mL/min    Comment: (NOTE) Calculated using the CKD-EPI Creatinine Equation (2021)    Anion gap 11 5 - 15    Comment: Performed at [facility], [address] [place], [place] [address]  Ethanol     Status: None    Collection Time: 01/25/23  6:43 PM  Result Value Ref Range   Alcohol, Ethyl (B) <10 <10 mg/dL    Comment: (NOTE) Lowest detectable limit for serum alcohol is 10 mg/dL.  For medical purposes only. Performed at [facility], [address] [place], [place] [address]   Urinalysis, Routine w reflex microscopic -Urine,  Clean Catch     Status: Abnormal   Collection Time: 01/25/23  6:43 PM  Result Value Ref Range   Color, Urine YELLOW YELLOW   APPearance CLEAR CLEAR   Specific Gravity, Urine 1.010 1.005 - 1.030   pH 7.0 5.0 - 8.0   Glucose, UA NEGATIVE NEGATIVE mg/dL   Hgb urine dipstick TRACE (A) NEGATIVE   Bilirubin Urine NEGATIVE NEGATIVE   Ketones, ur NEGATIVE NEGATIVE mg/dL   Protein, ur NEGATIVE NEGATIVE mg/dL   Nitrite NEGATIVE NEGATIVE   Leukocytes,Ua NEGATIVE NEGATIVE    Comment: Performed at [facility], [address] [place], [place] [address]  Urinalysis, Microscopic (reflex)     Status: Abnormal   Collection Time: 01/25/23  6:43 PM  Result Value Ref Range   RBC / HPF 0-5 0 - 5 RBC/hpf    Comment: MICROSCOPIC EXAM PERFORMED ON UNCONCENTRATED URINE LESS THAN 10 mL OF URINE SUBMITTED    WBC, UA 0-5 0 - 5 WBC/hpf    Comment: MICROSCOPIC EXAM PERFORMED ON UNCONCENTRATED URINE LESS THAN 10 mL OF URINE SUBMITTED    Bacteria, UA RARE (A) NONE SEEN    Comment: MICROSCOPIC EXAM PERFORMED ON UNCONCENTRATED URINE LESS THAN 10 mL OF URINE SUBMITTED    Squamous Epithelial / HPF 0-5 0 - 5 /HPF    Comment: MICROSCOPIC EXAM PERFORMED ON UNCONCENTRATED URINE LESS THAN 10 mL OF URINE SUBMITTED Performed at [facility], [address] [place], [place] [address]   I-stat chem 8, ED     Status: Abnormal   Collection Time: 01/25/23  6:52 PM  Result Value Ref Range   Sodium 130 (L) 135 - 145 mmol/L   Potassium 4.3 3.5 - 5.1 mmol/L   Chloride 96 (L) 98 - 111 mmol/L   BUN 17 8 - 23 mg/dL   Creatinine, Ser 9.52 0.61 - 1.24 mg/dL   Glucose, Bld 92 70 - 99  mg/dL    Comment: Glucose reference range applies only to samples taken after fasting for at least 8 hours.   Calcium, Ion 1.12 (L) 1.15 - 1.40 mmol/L   TCO2 23 22 - 32 mmol/L   Hemoglobin 13.6 13.0 - 17.0 g/dL   HCT 84.1 32.4 - 40.1 %   CT ANGIO HEAD NECK W WO CM  Result Date: 01/26/2023 CLINICAL DATA:  Altered mental status, stroke suspected EXAM: CT ANGIOGRAPHY HEAD AND NECK WITH AND WITHOUT CONTRAST TECHNIQUE: Multidetector CT imaging of the head and neck was performed using the standard protocol during bolus administration of intravenous contrast. Multiplanar CT image reconstructions and MIPs were obtained to evaluate the vascular anatomy. Carotid stenosis measurements (when applicable) are obtained utilizing NASCET criteria, using the distal internal carotid diameter as the denominator. RADIATION DOSE REDUCTION: This exam was performed according to the departmental dose-optimization program which includes automated exposure control, adjustment of the mA and/or kV according to patient size and/or use of iterative reconstruction technique. CONTRAST:  75mL OMNIPAQUE IOHEXOL 350 MG/ML SOLN COMPARISON:  No prior CTA available, correlation is made with CT head and MRI head 01/25/2023 FINDINGS: CT HEAD FINDINGS For noncontrast findings, please see same day CT head. CTA NECK FINDINGS Aortic arch: Standard branching. Imaged portion shows no evidence of aneurysm or dissection. No significant stenosis of the major arch vessel origins. Aortic atherosclerosis. 60% stenosis in the proximal brachiocephalic artery (series 7, image 321). Right carotid system: 50% stenosis in the proximal right ICA (series 7, image 217). No evidence of dissection or occlusion. Left  carotid system: 50% stenosis in the proximal left ICA (series 7, image 213). No evidence of dissection or occlusion. Vertebral arteries: Moderate stenosis at the origin of the left vertebral artery, which is otherwise patent to the skull base without  significant stenosis. Mild stenosis of the origin the right vertebral artery, which is otherwise patent to the skull base. Skeleton: No acute osseous abnormality. Degenerative changes in the cervical spine. Other neck: No acute finding. Upper chest: No focal pulmonary opacity or pleural effusion. Patulous esophagus. Review of the MIP images confirms the above findings CTA HEAD FINDINGS Anterior circulation: Both internal carotid arteries are patent to the termini, with scattered calcifications but without significant stenosis. A1 segments patent. Normal anterior communicating artery. Anterior cerebral arteries are patent to their distal aspects without significant stenosis. No M1 stenosis or occlusion. A calcification noted on the prior CT head is noted in a distal right MCA branch (series 7, image 89), with severe stenosis and poor opacification of its branch vessels (series 7, images 91-93 and 88-89), which appear to supply the area of acute infarct noted on the same-day CT and MRI. Left MCA branches perfused to their distal aspects without significant stenosis. Posterior circulation: Vertebral arteries patent to the vertebrobasilar junction without significant stenosis. Posterior inferior cerebellar arteries patent proximally. Basilar patent to its distal aspect without significant stenosis. Superior cerebellar arteries patent proximally. Patent P1 segments. PCAs perfused to their distal aspects without significant stenosis. The left posterior communicating artery is patent, although there may be severe stenosis at its origin (series 7, image 116. The right posterior communicating artery is not definitively seen. Venous sinuses: As permitted by contrast timing, patent. Anatomic variants: None significant. No evidence of aneurysm or vascular malformation. Review of the MIP images confirms the above findings IMPRESSION: 1. Focal calcification in a distal right MCA branch, with severe stenosis/poor opacification of  its branch vessels, concerning for acute occlusion as these vessels appear to supply the area of acute infarct in the posterior right MCA territory. 2. 50% stenosis in the bilateral proximal ICAs. 3. 60% stenosis in the proximal brachiocephalic artery. 4. Moderate stenosis at the origin of the left vertebral artery and mild stenosis at the origin of the right vertebral artery. 5. Aortic atherosclerosis. Aortic Atherosclerosis (ICD10-I70.0). These results will be called to the ordering clinician or representative by the Radiologist Assistant, and communication documented in the PACS or [employer]. Electronically Signed   By: Wiliam Ke M.D.   On: 01/26/2023 01:24   CT HEAD WO CONTRAST  Result Date: 01/25/2023 CLINICAL DATA:  Altered mental status, stroke suspected EXAM: CT HEAD WITHOUT CONTRAST TECHNIQUE: Contiguous axial images were obtained from the base of the skull through the vertex without intravenous contrast. RADIATION DOSE REDUCTION: This exam was performed according to the departmental dose-optimization program which includes automated exposure control, adjustment of the mA and/or kV according to patient size and/or use of iterative reconstruction technique. COMPARISON:  CT head 06/10/2008, correlation is also made with subsequent MRI head 01/25/2023 FINDINGS: Brain: Hypodensity in the posterior right MCA territory, involving the right posterior temporal, and posterior frontal, and anterior parietal lobes, concerning for edema from gyral swelling. No evidence of acute hemorrhage, mass, mass effect, or midline shift. No hydrocephalus or extra-axial collection. Vascular: Focal calcification in right temporal lobe (series 2, image 46), which is nonspecific but could be vascular. No other hyperdense vessel. Skull: Negative for fracture or focal lesion. Sinuses/Orbits: Mild mucosal thickening in the paranasal sinuses. Status post bilateral lens  replacements. Other: The mastoid air cells are well  aerated. IMPRESSION: Hypodensity in the posterior right MCA territory, consistent with acute infarct and edema from gyral swelling, as seen on the subsequent MRI. These results were called by telephone at the time of interpretation on 01/25/2023 at 9:47 pm to provider MATTHEW TRIFAN , who verbally acknowledged these results. Electronically Signed   By: Wiliam Ke M.D.   On: 01/25/2023 21:51   MR BRAIN WO CONTRAST  Result Date: 01/25/2023 CLINICAL DATA:  Stroke suspected EXAM: MRI HEAD WITHOUT CONTRAST TECHNIQUE: Multiplanar, multiecho pulse sequences of the brain and surrounding structures were obtained without intravenous contrast. COMPARISON:  No prior MRI available, correlation is made with 01/25/2023 CT head FINDINGS: Brain: Restricted diffusion with ADC correlate in the posterior right MCA territory, involving the posterior right temporal lobe, posterior right frontal lobe, and anterior right parietal lobe. This is associated with increased T2 hyperintense signal and gyral swelling. A focus of hemosiderin deposition is seen in the right posterior temporal lobe (series 4, image 61), which could indicate thrombus in a distal right MCA vessel. Additional punctate focus of restricted diffusion with ADC correlate in the left medial cerebellum (series 2, image 9). No acute hemorrhage, mass, mass effect, or midline shift. No hydrocephalus or extra-axial collection. Normal pituitary and craniocervical junction. Vascular: Normal proximal arterial flow voids. Skull and upper cervical spine: Normal marrow signal. Sinuses/Orbits: Clear paranasal sinuses. No acute finding in the orbits. Status post bilateral lens replacements. Other: The mastoid air cells are well aerated. IMPRESSION: 1. Acute infarct in the posterior right MCA territory, involving the posterior right temporal lobe, posterior right frontal lobe, and anterior right parietal lobe. 2. A focus of hemosiderin deposition in the right posterior temporal  lobe, which could indicate thrombus in a distal right MCA vessel versus sequela of prior microhemorrhage. 3. Additional punctate acute infarct in the left medial cerebellum. These results were called by telephone at the time of interpretation on 01/25/2023 at 9:47 pm to provider MATTHEW TRIFAN , who verbally acknowledged these results. Electronically Signed   By: Wiliam Ke M.D.   On: 01/25/2023 21:49    Pending Labs Unresulted Labs (From admission, onward)     Start     Ordered   01/26/23 0500  Basic metabolic panel  Tomorrow morning,   R        01/25/23 2330   01/26/23 0500  Lipid panel  Tomorrow morning,   R        01/25/23 2330   01/26/23 0500  Hemoglobin A1c  Tomorrow morning,   R        01/25/23 2330            Vitals/Pain Today's Vitals   01/25/23 2305 01/26/23 0005 01/26/23 0200 01/26/23 0243  BP: (!) 166/69 (!) 179/63 (!) 154/69   Pulse: (!) 56 (!) 57 (!) 50   Resp:  16 17   Temp:    98.6 F (37 C)  TempSrc:    Oral  SpO2: 92% 95% 92%   Weight:      Height:      PainSc:        Isolation Precautions No active isolations  Medications Medications  aspirin tablet 325 mg (has no administration in time range)  methotrexate (RHEUMATREX) tablet 12.5 mg (has no administration in time range)  flecainide (TAMBOCOR) tablet 50 mg (has no administration in time range)  metoprolol succinate (TOPROL-XL) 24 hr tablet 50 mg (has no administration in time range)  predniSONE (DELTASONE) tablet 5 mg (has no administration in time range)  senna-docusate (Senokot-S) tablet 1 tablet (has no administration in time range)  cyanocobalamin (VITAMIN B12) tablet 1,000 mcg (has no administration in time range)  folic acid (FOLVITE) tablet 1 mg (has no administration in time range)  acetaminophen (TYLENOL) tablet 650 mg (has no administration in time range)    Or  acetaminophen (TYLENOL) suppository 650 mg (has no administration in time range)  iohexol (OMNIPAQUE) 350 MG/ML injection 80  mL (75 mLs Intravenous Contrast Given 01/25/23 2330)    Mobility walks with device     Focused Assessments Neuro Assessment Handoff:  Swallow screen pass? Yes  Cardiac Rhythm: Sinus bradycardia NIH Stroke Scale  Dizziness Present: No Headache Present: No Interval:  (q 2hr) Level of Consciousness (1a.)   : Alert, keenly responsive LOC Questions (1b. )   : Answers both questions correctly LOC Commands (1c. )   : Performs both tasks correctly Best Gaze (2. )  : Normal Visual (3. )  : No visual loss Facial Palsy (4. )    : Normal symmetrical movements Motor Arm, Left (5a. )   : No drift Motor Arm, Right (5b. ) : No drift Motor Leg, Left (6a. )  : No drift Motor Leg, Right (6b. ) : No drift Limb Ataxia (7. ): Absent Sensory (8. )  : Normal, no sensory loss Best Language (9. )  : No aphasia Dysarthria (10. ): Normal Extinction/Inattention (11.)   : No Abnormality Complete NIHSS TOTAL: 0     Neuro Assessment: Within Defined Limits Neuro Checks:   Initial (01/25/23 1955)  Has TPA been given? No If patient is a Neuro Trauma and patient is going to OR before floor call report to 4N Charge nurse: [phone] or [phone]   R Recommendations: See Admitting Provider Note  Report given to:   Additional Notes: Pt able to walk to bathroom with assistance.

## 2023-01-26 NOTE — Progress Notes (Signed)
STROKE TEAM PROGRESS NOTE   SUBJECTIVE (INTERVAL HISTORY) His wife and daughters are at the bedside.  Overall his condition is rapidly improving. He stated that his left hand has improved.  He is taking Eliquis at home, compliant with medication.   OBJECTIVE Temp:  [98 F (36.7 C)-98.6 F (37 C)] 98.4 F (36.9 C) (10/16 1702) Pulse Rate:  [50-83] 58 (10/16 1702) Cardiac Rhythm: Heart block;Bundle branch block (10/16 0728) Resp:  [14-26] 18 (10/16 1115) BP: (126-197)/(62-76) 185/74 (10/16 1702) SpO2:  [92 %-100 %] 100 % (10/16 1702) Weight:  [80.3 kg-80.5 kg] 80.5 kg (10/16 0528)  No results for input(s): "GLUCAP" in the last 168 hours. Recent Labs  Lab 01/25/23 1843 01/25/23 1852 01/26/23 0507  NA 132* 130* 131*  K 4.3 4.3 3.5  CL 95* 96* 94*  CO2 26  --  27  GLUCOSE 95 92 85  BUN 15 17 12   CREATININE 1.05 1.00 0.93  CALCIUM 9.5  --  9.4   Recent Labs  Lab 01/25/23 1843  AST 14*  ALT 15  ALKPHOS 87  BILITOT 0.5  PROT 6.7  ALBUMIN 3.7   Recent Labs  Lab 01/25/23 1843 01/25/23 1852  WBC 6.3  --   NEUTROABS 4.1  --   HGB 12.6* 13.6  HCT 37.5* 40.0  MCV 94.0  --   PLT 177  --    No results for input(s): "CKTOTAL", "CKMB", "CKMBINDEX", "TROPONINI" in the last 168 hours. Recent Labs    01/25/23 1843  LABPROT 15.6*  INR 1.2   Recent Labs    01/25/23 1843  COLORURINE YELLOW  LABSPEC 1.010  PHURINE 7.0  GLUCOSEU NEGATIVE  HGBUR TRACE*  BILIRUBINUR NEGATIVE  KETONESUR NEGATIVE  PROTEINUR NEGATIVE  NITRITE NEGATIVE  LEUKOCYTESUR NEGATIVE       Component Value Date/Time   CHOL 196 01/26/2023 0507   TRIG 91 01/26/2023 0507   HDL 63 01/26/2023 0507   CHOLHDL 3.1 01/26/2023 0507   VLDL 18 01/26/2023 0507   LDLCALC 115 (H) 01/26/2023 0507   Lab Results  Component Value Date   HGBA1C 5.1 01/26/2023   No results found for: "LABOPIA", "COCAINSCRNUR", "LABBENZ", "AMPHETMU", "THCU", "LABBARB"  Recent Labs  Lab 01/25/23 1843  ETH <10    I have  personally reviewed the radiological images below and agree with the radiology interpretations.  CT ANGIO HEAD NECK W WO CM  Result Date: 01/26/2023 CLINICAL DATA:  Altered mental status, stroke suspected EXAM: CT ANGIOGRAPHY HEAD AND NECK WITH AND WITHOUT CONTRAST TECHNIQUE: Multidetector CT imaging of the head and neck was performed using the standard protocol during bolus administration of intravenous contrast. Multiplanar CT image reconstructions and MIPs were obtained to evaluate the vascular anatomy. Carotid stenosis measurements (when applicable) are obtained utilizing NASCET criteria, using the distal internal carotid diameter as the denominator. RADIATION DOSE REDUCTION: This exam was performed according to the departmental dose-optimization program which includes automated exposure control, adjustment of the mA and/or kV according to patient size and/or use of iterative reconstruction technique. CONTRAST:  75mL OMNIPAQUE IOHEXOL 350 MG/ML SOLN COMPARISON:  No prior CTA available, correlation is made with CT head and MRI head 01/25/2023 FINDINGS: CT HEAD FINDINGS For noncontrast findings, please see same day CT head. CTA NECK FINDINGS Aortic arch: Standard branching. Imaged portion shows no evidence of aneurysm or dissection. No significant stenosis of the major arch vessel origins. Aortic atherosclerosis. 60% stenosis in the proximal brachiocephalic artery (series 7, image 321). Right carotid system: 50% stenosis  in the proximal right ICA (series 7, image 217). No evidence of dissection or occlusion. Left carotid system: 50% stenosis in the proximal left ICA (series 7, image 213). No evidence of dissection or occlusion. Vertebral arteries: Moderate stenosis at the origin of the left vertebral artery, which is otherwise patent to the skull base without significant stenosis. Mild stenosis of the origin the right vertebral artery, which is otherwise patent to the skull base. Skeleton: No acute osseous  abnormality. Degenerative changes in the cervical spine. Other neck: No acute finding. Upper chest: No focal pulmonary opacity or pleural effusion. Patulous esophagus. Review of the MIP images confirms the above findings CTA HEAD FINDINGS Anterior circulation: Both internal carotid arteries are patent to the termini, with scattered calcifications but without significant stenosis. A1 segments patent. Normal anterior communicating artery. Anterior cerebral arteries are patent to their distal aspects without significant stenosis. No M1 stenosis or occlusion. A calcification noted on the prior CT head is noted in a distal right MCA branch (series 7, image 89), with severe stenosis and poor opacification of its branch vessels (series 7, images 91-93 and 88-89), which appear to supply the area of acute infarct noted on the same-day CT and MRI. Left MCA branches perfused to their distal aspects without significant stenosis. Posterior circulation: Vertebral arteries patent to the vertebrobasilar junction without significant stenosis. Posterior inferior cerebellar arteries patent proximally. Basilar patent to its distal aspect without significant stenosis. Superior cerebellar arteries patent proximally. Patent P1 segments. PCAs perfused to their distal aspects without significant stenosis. The left posterior communicating artery is patent, although there may be severe stenosis at its origin (series 7, image 116. The right posterior communicating artery is not definitively seen. Venous sinuses: As permitted by contrast timing, patent. Anatomic variants: None significant. No evidence of aneurysm or vascular malformation. Review of the MIP images confirms the above findings IMPRESSION: 1. Focal calcification in a distal right MCA branch, with severe stenosis/poor opacification of its branch vessels, concerning for acute occlusion as these vessels appear to supply the area of acute infarct in the posterior right MCA territory.  2. 50% stenosis in the bilateral proximal ICAs. 3. 60% stenosis in the proximal brachiocephalic artery. 4. Moderate stenosis at the origin of the left vertebral artery and mild stenosis at the origin of the right vertebral artery. 5. Aortic atherosclerosis. Aortic Atherosclerosis (ICD10-I70.0). These results will be called to the ordering clinician or representative by the Radiologist Assistant, and communication documented in the PACS or [employer]. Electronically Signed   By: Wiliam Ke M.D.   On: 01/26/2023 01:24   CT HEAD WO CONTRAST  Result Date: 01/25/2023 CLINICAL DATA:  Altered mental status, stroke suspected EXAM: CT HEAD WITHOUT CONTRAST TECHNIQUE: Contiguous axial images were obtained from the base of the skull through the vertex without intravenous contrast. RADIATION DOSE REDUCTION: This exam was performed according to the departmental dose-optimization program which includes automated exposure control, adjustment of the mA and/or kV according to patient size and/or use of iterative reconstruction technique. COMPARISON:  CT head 06/10/2008, correlation is also made with subsequent MRI head 01/25/2023 FINDINGS: Brain: Hypodensity in the posterior right MCA territory, involving the right posterior temporal, and posterior frontal, and anterior parietal lobes, concerning for edema from gyral swelling. No evidence of acute hemorrhage, mass, mass effect, or midline shift. No hydrocephalus or extra-axial collection. Vascular: Focal calcification in right temporal lobe (series 2, image 46), which is nonspecific but could be vascular. No other hyperdense vessel. Skull: Negative for  fracture or focal lesion. Sinuses/Orbits: Mild mucosal thickening in the paranasal sinuses. Status post bilateral lens replacements. Other: The mastoid air cells are well aerated. IMPRESSION: Hypodensity in the posterior right MCA territory, consistent with acute infarct and edema from gyral swelling, as seen on the  subsequent MRI. These results were called by telephone at the time of interpretation on 01/25/2023 at 9:47 pm to provider MATTHEW TRIFAN , who verbally acknowledged these results. Electronically Signed   By: Wiliam Ke M.D.   On: 01/25/2023 21:51   MR BRAIN WO CONTRAST  Result Date: 01/25/2023 CLINICAL DATA:  Stroke suspected EXAM: MRI HEAD WITHOUT CONTRAST TECHNIQUE: Multiplanar, multiecho pulse sequences of the brain and surrounding structures were obtained without intravenous contrast. COMPARISON:  No prior MRI available, correlation is made with 01/25/2023 CT head FINDINGS: Brain: Restricted diffusion with ADC correlate in the posterior right MCA territory, involving the posterior right temporal lobe, posterior right frontal lobe, and anterior right parietal lobe. This is associated with increased T2 hyperintense signal and gyral swelling. A focus of hemosiderin deposition is seen in the right posterior temporal lobe (series 4, image 61), which could indicate thrombus in a distal right MCA vessel. Additional punctate focus of restricted diffusion with ADC correlate in the left medial cerebellum (series 2, image 9). No acute hemorrhage, mass, mass effect, or midline shift. No hydrocephalus or extra-axial collection. Normal pituitary and craniocervical junction. Vascular: Normal proximal arterial flow voids. Skull and upper cervical spine: Normal marrow signal. Sinuses/Orbits: Clear paranasal sinuses. No acute finding in the orbits. Status post bilateral lens replacements. Other: The mastoid air cells are well aerated. IMPRESSION: 1. Acute infarct in the posterior right MCA territory, involving the posterior right temporal lobe, posterior right frontal lobe, and anterior right parietal lobe. 2. A focus of hemosiderin deposition in the right posterior temporal lobe, which could indicate thrombus in a distal right MCA vessel versus sequela of prior microhemorrhage. 3. Additional punctate acute infarct in the  left medial cerebellum. These results were called by telephone at the time of interpretation on 01/25/2023 at 9:47 pm to provider MATTHEW TRIFAN , who verbally acknowledged these results. Electronically Signed   By: Wiliam Ke M.D.   On: 01/25/2023 21:49     PHYSICAL EXAM  Temp:  [98 F (36.7 C)-98.6 F (37 C)] 98.4 F (36.9 C) (10/16 1702) Pulse Rate:  [50-83] 58 (10/16 1702) Resp:  [14-26] 18 (10/16 1115) BP: (126-197)/(62-76) 185/74 (10/16 1702) SpO2:  [92 %-100 %] 100 % (10/16 1702) Weight:  [80.3 kg-80.5 kg] 80.5 kg (10/16 0528)  General - Well nourished, well developed, in no apparent distress.  Ophthalmologic - fundi not visualized due to noncooperation.  Cardiovascular - Regular rhythm and rate.  Mental Status -  Level of arousal and orientation to time, place, and person were intact. Language including expression, naming, repetition, comprehension was assessed and found intact. Fund of Knowledge was assessed and was intact.  Cranial Nerves II - XII - II - Visual field intact OU. III, IV, VI - Extraocular movements intact. V - Facial sensation intact bilaterally. VII - Facial movement intact bilaterally. VIII - Hearing & vestibular intact bilaterally. X - Palate elevates symmetrically. XI - Chin turning & shoulder shrug intact bilaterally. XII - Tongue protrusion intact.  Motor Strength - The patient's strength was normal in all extremities and pronator drift was absent except left hand grip 5 -/5.  Bulk was normal and fasciculations were absent.   Motor Tone - Muscle tone was assessed  at the neck and appendages and was normal.  Reflexes - The patient's reflexes were symmetrical in all extremities and he had no pathological reflexes.  Sensory - Light touch, temperature/pinprick were assessed and were symmetrical.    Coordination - The patient had normal movements in the hands and feet with no ataxia or dysmetria.  Tremor was absent.  Gait and Station -  deferred.   ASSESSMENT/PLAN Mr. Orvan Papadakis is a 89 y.o. male with history of A-fib on Eliquis, hypertension, glaucoma admitted for confusion, left hand weakness. No tPA given due to on Eliquis.    Stroke: Moderate right MCA and small left cerebellum infarcts, embolic pattern secondary to cardioembolic source on Eliquis but he does have large vessel disease CT the right MCA infarct CTA head and neck decreased right MCA branch flow, bilateral ICA 50% stenosis, brachiocephalic artery 60% stenosis but right CCA origin patent MRI moderate right MCA and small left cerebellum infarcts 2D Echo pending LDL 115 HgbA1c 5.1 SCDs for VTE prophylaxis Eliquis (apixaban) daily prior to admission, now on aspirin 325 mg daily.  Will resume Eliquis tomorrow.  Recommend aspirin 81 on top of Eliquis. Patient counseled to be compliant with his antithrombotic medications Ongoing aggressive stroke risk factor management Therapy recommendations: Outpatient PT OT Disposition: Pending  A-fib On Eliquis PTA On flecainide and metoprolol PTA, resumed in hospital Plan to resume Eliquis tomorrow  Hypertension Stable On home metoprolol Long term BP goal normotensive  Hyperlipidemia Home meds: None LDL 115, goal < 70 Now on Crestor 20 Continue statin at discharge  Other Stroke Risk Factors Advanced age  Other Active Problems Glaucoma  Hospital day # 1  Neurology will sign off. Please call with questions. Pt will follow up with stroke clinic NP at [facility] in about 4 weeks. Thanks for the consult.   Marvel Plan, MD PhD Stroke Neurology 01/26/2023 6:07 PM    To contact Stroke Continuity provider, please refer to [email or web address]. After hours, contact General Neurology

## 2023-01-26 NOTE — TOC Initial Note (Signed)
Transition of Care [facility]) - Initial/Assessment Note    Patient Details  Name: Jonathan Woodward MRN: [id number] Date of Birth: 10/28/1932  [facility] CM/SW Contact:    Kermit Balo, RN Phone Number: 01/26/2023, 3:42 PM  Clinical Narrative:                  Patient is from home with his spouse. They are together all the time.  Pt was primary driver but family to pitch in and assist with transportation. Pt manages his own medications and denies any issues. Current recommendations are for outpatient therapy. Pt prefers to attend at [facility]. CM will fax orders to [place]. TOC following.  Expected Discharge Plan: OP Rehab Barriers to Discharge: Continued Medical Work up   Patient Goals and CMS Choice     Choice offered to / list presented to : Patient, Adult Children      Expected Discharge Plan and Services   Discharge Planning Services: CM Consult   Living arrangements for the past 2 months: Single Family Home                                      Prior Living Arrangements/Services Living arrangements for the past 2 months: Single Family Home Lives with:: Spouse Patient language and need for interpreter reviewed:: Yes Do you feel safe going back to the place where you live?: Yes        Care giver support system in place?: Yes (comment) Current home services: DME (cane/ walker/ shower seat) Criminal Activity/Legal Involvement Pertinent to Current Situation/Hospitalization: No - Comment as needed  Activities of Daily Living   ADL Screening (condition at time of admission) Independently performs ADLs?: Yes (appropriate for developmental age) Is the patient deaf or have difficulty hearing?: Yes Does the patient have difficulty seeing, even when wearing glasses/contacts?: No Does the patient have difficulty concentrating, remembering, or making decisions?: Yes  Permission Sought/Granted                  Emotional  Assessment Appearance:: Appears stated age Attitude/Demeanor/Rapport: Engaged Affect (typically observed): Accepting Orientation: : Oriented to Self, Oriented to Place, Oriented to  Time, Oriented to Situation   Psych Involvement: No (comment)  Admission diagnosis:  CVA (cerebral vascular accident) (HCC) [I63.9] Persistent atrial fibrillation (HCC) [I48.19] Cerebrovascular accident (CVA), unspecified mechanism (HCC) [I63.9] Patient Active Problem List   Diagnosis Date Noted   Hyperlipidemia 01/26/2023   CVA (cerebral vascular accident) (HCC) 01/25/2023   Primary osteoarthritis of right knee 07/02/2022   Localized osteoarthritis of left knee 03/03/2022   Persistent atrial fibrillation (HCC)    PCP:  Lonie Peak, PA-C Pharmacy:   [facility] - [place], [place] - [address] [place] [address] Phone: [phone] Fax: [phone]     Social Determinants of Health (SDOH) Social History: SDOH Screenings   Food Insecurity: No Food Insecurity (01/26/2023)  Housing: Low Risk  (01/26/2023)  Transportation Needs: No Transportation Needs (01/26/2023)  Utilities: Not At Risk (01/26/2023)  Tobacco Use: Medium Risk (01/25/2023)   SDOH Interventions:     Readmission Risk Interventions     No data to display

## 2023-01-27 ENCOUNTER — Other Ambulatory Visit (HOSPITAL_COMMUNITY): Payer: Self-pay

## 2023-01-27 ENCOUNTER — Encounter: Payer: Self-pay | Admitting: Cardiovascular Disease

## 2023-01-27 ENCOUNTER — Inpatient Hospital Stay (HOSPITAL_COMMUNITY): Payer: PPO

## 2023-01-27 DIAGNOSIS — I6389 Other cerebral infarction: Secondary | ICD-10-CM

## 2023-01-27 DIAGNOSIS — I6340 Cerebral infarction due to embolism of unspecified cerebral artery: Secondary | ICD-10-CM | POA: Diagnosis not present

## 2023-01-27 LAB — ECHOCARDIOGRAM COMPLETE
Area-P 1/2: 2.69 cm2
Calc EF: 72.8 %
Height: 66 in
MV VTI: 2.7 cm2
P 1/2 time: 693 ms
S' Lateral: 2.4 cm
Single Plane A2C EF: 70.1 %
Single Plane A4C EF: 75.2 %
Weight: 2839.52 [oz_av]

## 2023-01-27 LAB — BASIC METABOLIC PANEL
Anion gap: 11 (ref 5–15)
BUN: 13 mg/dL (ref 8–23)
CO2: 26 mmol/L (ref 22–32)
Calcium: 9.6 mg/dL (ref 8.9–10.3)
Chloride: 96 mmol/L — ABNORMAL LOW (ref 98–111)
Creatinine, Ser: 0.97 mg/dL (ref 0.61–1.24)
GFR, Estimated: >60 mL/min (ref >60)
Glucose, Bld: 99 mg/dL (ref 70–99)
Potassium: 4.3 mmol/L (ref 3.5–5.1)
Sodium: 133 mmol/L — ABNORMAL LOW (ref 135–145)

## 2023-01-27 MED ORDER — ROSUVASTATIN CALCIUM 20 MG PO TABS
20.0000 mg | ORAL_TABLET | Freq: Every day | ORAL | 0 refills | Status: AC
  Filled 2023-01-27: qty 30, 30d supply, fill #0

## 2023-01-27 MED ORDER — ASPIRIN 81 MG PO TBEC
81.0000 mg | DELAYED_RELEASE_TABLET | Freq: Every day | ORAL | 12 refills | Status: AC
  Filled 2023-01-27: qty 30, 30d supply, fill #0

## 2023-01-27 MED ORDER — METOPROLOL SUCCINATE ER 50 MG PO TB24
50.0000 mg | ORAL_TABLET | Freq: Every day | ORAL | 0 refills | Status: AC
  Filled 2023-01-27: qty 30, 30d supply, fill #0

## 2023-01-27 MED ORDER — ACETAMINOPHEN 325 MG PO TABS: 650.0000 mg | ORAL_TABLET | Freq: Four times a day (QID) | ORAL | Status: AC | PRN

## 2023-01-27 NOTE — Plan of Care (Signed)
  Problem: Education: Goal: Knowledge of the prescribed therapeutic regimen will improve Outcome: Progressing Goal: Individualized Educational Video(s) Outcome: Progressing   Problem: Activity: Goal: Ability to avoid complications of mobility impairment will improve Outcome: Progressing Goal: Range of joint motion will improve Outcome: Progressing   Problem: Clinical Measurements: Goal: Postoperative complications will be avoided or minimized Outcome: Progressing   Problem: Pain Management: Goal: Pain level will decrease with appropriate interventions Outcome: Progressing   Problem: Skin Integrity: Goal: Will show signs of wound healing Outcome: Progressing   Problem: Education: Goal: Knowledge of General Education information will improve Description: Including pain rating scale, medication(s)/side effects and non-pharmacologic comfort measures Outcome: Progressing   Problem: Health Behavior/Discharge Planning: Goal: Ability to manage health-related needs will improve Outcome: Progressing   Problem: Clinical Measurements: Goal: Ability to maintain clinical measurements within normal limits will improve Outcome: Progressing Goal: Will remain free from infection Outcome: Progressing Goal: Diagnostic test results will improve Outcome: Progressing Goal: Respiratory complications will improve Outcome: Progressing Goal: Cardiovascular complication will be avoided Outcome: Progressing   Problem: Activity: Goal: Risk for activity intolerance will decrease Outcome: Progressing   Problem: Nutrition: Goal: Adequate nutrition will be maintained Outcome: Progressing   Problem: Coping: Goal: Level of anxiety will decrease Outcome: Progressing   Problem: Elimination: Goal: Will not experience complications related to bowel motility Outcome: Progressing Goal: Will not experience complications related to urinary retention Outcome: Progressing   Problem: Pain  Managment: Goal: General experience of comfort will improve Outcome: Progressing   Problem: Safety: Goal: Ability to remain free from injury will improve Outcome: Progressing   Problem: Skin Integrity: Goal: Risk for impaired skin integrity will decrease Outcome: Progressing   Problem: Education: Goal: Knowledge of disease or condition will improve Outcome: Progressing Goal: Knowledge of secondary prevention will improve (MUST DOCUMENT ALL) Outcome: Progressing Goal: Knowledge of patient specific risk factors will improve Loraine Leriche N/A or DELETE if not current risk factor) Outcome: Progressing   Problem: Health Behavior/Discharge Planning: Goal: Ability to manage health-related needs will improve Outcome: Progressing   Problem: Nutrition: Goal: Risk of aspiration will decrease Outcome: Progressing

## 2023-01-27 NOTE — Discharge Summary (Addendum)
 Family Medicine Teaching [facility] Discharge Summary  Patient name: Jonathan Woodward Medical record number: [id number] Date of birth: September 23, 1932 Age: 89 y.o. Gender: male Date of Admission: 01/25/2023  Date of Discharge: 01/27/2023 Admitting Physician: Gerrit Heck, DO  Primary Care Provider: Lonie Peak, PA-C Consultants: Neurology  Indication for Hospitalization: Coordination difficulty and confusion  Brief Hospital Course:  Jonathan Woodward is a 89 y.o.male with a history of A-fib, hypertension, osteoarthritis rheumatoid arthritis who was admitted to the [facility] Medicine Teaching Service at [facility] for new onset confusion and difficulty with coordination secondary to CVA.   His hospital course is detailed below:  CVA Presented with 2 days of coordination difficulty and confusion. He had a CT head without contrast followed by an MRI which showed acute right MCA territory infarct.  Neurology was consulted, recommended holding his Eliquis and obtaining CT angiogram for further characterization, which showed concern for acute occlusion of right distal MCA.  Echocardiogram obtained to assess for possible origin of clot; this showed grade 1 diastolic dysfunction with LVEF 65 to 70% but no intracardiac source of embolism.  Considered TEE for further workup, however on discussion with neurology agreed management would not change if TEE was obtained. Neurology recommended restarting Eliquis and starting ASA 81mg  daily prior to discharge. No significant deficits with strength or coordination at discharge, but did recommend home health PT/OT given his independent activity level prior to admission.  Hyperlipidemia LDL 115.  Started on Crestor 20 mg daily.  Other chronic conditions were medically managed with home medications and formulary alternatives as necessary: A-fib: Held Eliquis initially but this was restarted prior to discharge; continued home flecainide and  metoprolol. HTN: Initially held all home medications, but restarted amlodipine prior to discharge.  Continued to hold Diovan HCTZ as patient had some low pressures during hospitalization. Osteoarthritis: PRN tylenol Rheumatoid arthritis: continued home methotrexate, prednisone, vitamin b12 and folic acid   PCP Follow-up Recommendations: Follow-up cholesterol labs and continue statin therapy as indicated Follow-up outpatient stroke clinic at [facility] in about 4 weeks Follow up BP and add back diovan as needed   Discharge Diagnoses/Problem List:  Principal Problem:   CVA (cerebral vascular accident) [facility]) Active Problems:   Hyperlipidemia   Disposition: Home  Discharge Condition: Stable  Discharge Exam:  General: Well appearing, appears younger than stated age, pleasant, no acute distress. Cardiovascular: RRR, no murmurs Respiratory: CTA bilaterally, on room air Abdomen: Normoactive bowel sounds, soft, nontender, nondistended Extremities: Moves all extremities equally.  Skin is warm and dry.  No lower extremity edema.  Strength 5/5 in UE and LE bilaterally.  Issues for Follow Up:  1. Follow-up cholesterol labs and continue statin therapy as indicated 2. Follow-up BP and add back Diovan as needed  Significant Procedures: none  Significant Labs and Imaging:  No results for input(s): "WBC", "HGB", "HCT", "PLT" in the last 48 hours.  Recent Labs  Lab 01/27/23 0531  NA 133*  K 4.3  CL 96*  CO2 26  GLUCOSE 99  BUN 13  CREATININE 0.97  CALCIUM 9.6    Results/Tests Pending at Time of Discharge: None  Discharge Medications:  Allergies as of 01/27/2023   No Known Allergies      Medication List     STOP taking these medications    traZODone 50 MG tablet Commonly known as: DESYREL   valsartan-hydrochlorothiazide 320-25 MG tablet Commonly known as: DIOVAN-HCT       TAKE these medications    acetaminophen 325 MG tablet  Commonly known as: TYLENOL Take 2 tablets  (650 mg total) by mouth every 6 (six) hours as needed for mild pain (pain score 1-3) (or Fever >/= 101).   amLODipine 5 MG tablet Commonly known as: NORVASC Take 1 tablet (5 mg total) by mouth daily.   apixaban 5 MG Tabs tablet Commonly known as: Eliquis Take 1 tablet (5 mg total) by mouth 2 (two) times daily.   aspirin EC 81 MG tablet Take 1 tablet (81 mg total) by mouth daily. Swallow whole.   cyanocobalamin 1000 MCG tablet Commonly known as: VITAMIN B12 Take 1,000 mcg by mouth daily.   Fish Oil 1000 MG Caps Take 1,000 mg by mouth in the morning.   Flaxseed Oil 1000 MG Caps Take 1,000 mg by mouth in the morning.   flecainide 50 MG tablet Commonly known as: TAMBOCOR Take 1 tablet (50 mg total) by mouth 2 (two) times daily. Please contact the office to schedule appointment for additional refills.   folic acid 1 MG tablet Commonly known as: FOLVITE Take 1 mg by mouth daily.   methotrexate 2.5 MG tablet Commonly known as: RHEUMATREX Take 12.5 mg by mouth every Wednesday. Take 5 tablets by mouth weekly   metoprolol succinate 50 MG 24 hr tablet Commonly known as: TOPROL-XL Take 1 tablet (50 mg total) by mouth daily. Take with or immediately following a meal. What changed:  medication strength how much to take additional instructions   predniSONE 5 MG tablet Commonly known as: DELTASONE Take 5 mg by mouth daily with breakfast.   rosuvastatin 20 MG tablet Commonly known as: CRESTOR Take 1 tablet (20 mg total) by mouth daily.   sennosides-docusate sodium 8.6-50 MG tablet Commonly known as: SENOKOT-S Take 1 tablet by mouth daily as needed for constipation.        Discharge Instructions: Please refer to Patient Instructions section of EMR for full details.  Patient was counseled important signs and symptoms that should prompt return to medical care, changes in medications, dietary instructions, activity restrictions, and follow up appointments.   Follow-Up  Appointments:  Follow-up Information     [facility]. Schedule an appointment as soon as possible for a visit in 1 month(s).   Specialty: Neurology Why: stroke clinic Contact information: [address] [place] [address] [phone]                Cyndia Skeeters DO 01/28/2023, 1:19 PM PGY-1, [facility] Family Medicine    I have reviewed this discharge summary and agree with the documentation.    Darral Dash, DO

## 2023-01-27 NOTE — Discharge Instructions (Addendum)
Dear Jonathan Woodward,   Thank you for letting us participate in your care! In this section, you will find a brief hospital admission summary of why you were admitted to the hospital, what happened during your admission, your diagnosis/diagnoses, and recommended follow up.  Primary diagnosis: Stroke Treatment plan: You were started on aspirin and we are continuing your Eliquis. We also started you on Crestor for your hyperlipidemia (high cholesterol). You saw PT/OT in the hospital - continue to work with them outpatient when you get home.   POST-HOSPITAL & CARE INSTRUCTIONS We recommend following up with your PCP within 1 week from being discharged from the hospital. Please let PCP/Specialists know of any changes in medications that were made which you will be able to see in the medications section of this packet. Please also follow up with the Neurologists at [facility]  DOCTOR'S APPOINTMENTS & FOLLOW UP Future Appointments  Date Time Provider Department Center  04/29/2023 10:20 AM Croitoru, Rachelle Hora, MD [facility] None     Thank you for choosing [facility]! Take care and be well!  Family Medicine Teaching Service Inpatient Team [place]  [facility]  [address] [place], [place] [address] [phone]

## 2023-01-27 NOTE — Progress Notes (Signed)
Reviewed discharge with patient and his daughter Raynelle Fanning. Both understand the instructions. Pt will be wheeled down by this RN.

## 2023-01-27 NOTE — Progress Notes (Signed)
  Echocardiogram 2D Echocardiogram has been performed.  Janalyn Harder 01/27/2023, 9:01 AM

## 2023-01-27 NOTE — Assessment & Plan Note (Addendum)
LDL 115.  Patient not currently on a statin. -Continue Crestor 20mg  daily

## 2023-01-27 NOTE — Progress Notes (Signed)
Daily Progress Note Intern Pager: [phone]  Patient name: Jonathan Woodward Medical record number: [id number] Date of birth: 1932/04/29 Age: 89 y.o. Gender: male  Primary Care Provider: Lonie Peak, PA-C Consultants: Neurology (signed off) Code Status: DNR   Pt Overview and Major Events to Date:  10/15: Admitted to FMTS service 10/17: Neurology signed off; Echocardiogram   Assessment and Plan: Jonathan Woodward is a 89 y.o. male presenting with 2 days of coordination difficulty and confusion secondary to CVA.  Patient is doing well today, alert and communicative.  He has had no difficulty with speech or swallowing; will not pursue SLP evaluation.  PT/OT has seen the patient and would recommend home therapy upon discharge.  He did have echocardiogram completed just before I saw him, will follow-up these results and discussed with patient and family.  Patient is now 48 hours outside of stroke event; will restart his home blood pressure medications today, will restart Eliquis.   Pertinent PMH/PSH includes A-fib, hypertension.  Assessment & Plan CVA (cerebral vascular accident) (HCC) Large posterior right MCA territory infarct seen on CT head, MRI brain, and CTA head and neck.  Neurology was consulted, appreciate their recommendations; will reconsult as needed.  Plan for patient to follow-up in stroke clinic at [facility] in about 4 weeks. PT/OT has seen patient and agree patient is okay to return home when medically stable.  Patient is now at least 48 hours outside of event window; no longer requiring permissive hypertension, will plan to restart home antihypertensives as appropriate. Echocardiogram showed Grade 1 diastolic dysfunction with LVEF 65-70%; no intracardiac source of embolism was detected but recommended consideration of TEE for further workup. We will reach out to Neurology for their thoughts on TEE. -Neurology consulted, appreciate recommendations as below; will inquire  about TEE. -Aspirin 81 mg daily -Resume Eliquis today -Restart home amlodipine; will continue to hold Diovan HCTZ as patient has has some low pressures. Hyperlipidemia LDL 115.  Patient not currently on a statin. -Continue Crestor 20mg  daily   Chronic and Stable Conditions: A-fib: Restarting Eliquis, continue flecainide and metoprolol. K+ goal >4. HTN: Restart home amlodipine; continue to hold Diovan HCTZ Osteoarthritis: PRN tylenol Rheumatoid arthritis: continue home methotrexate, prednisone, vitamin b12 and folic acid     FEN/GI: Heart healthy diet PPx: CDs Dispo:Pending PT recommendations  pending clinical improvement . Barriers include clinical improvement.   Subjective:  Patient seen this morning lying in bed with daughter at bedside.  He is pleasant and in a good mood.  He has just had his echocardiogram done.  He states he is looking forward to going home.  No complaints at this time.  Objective: Temp:  [98.2 F (36.8 C)-98.4 F (36.9 C)] 98.3 F (36.8 C) (10/17 1153) Pulse Rate:  [58-67] 67 (10/17 1153) Resp:  [17-18] 17 (10/17 1153) BP: (138-185)/(62-78) 138/70 (10/17 1153) SpO2:  [95 %-100 %] 97 % (10/17 1153) Physical Exam: General: Well appearing, and appears younger than stated age, pleasant, no acute distress. Cardiovascular: RRR, no murmurs Respiratory: CTA bilaterally, on room air Abdomen: Normoactive bowel sounds, soft, nontender, nondistended Extremities: Moves all extremities equally.  Skin is warm and dry.  No lower extremity edema.  Strength 5/5 in UE and LE bilaterally.  Laboratory: Most recent CBC Lab Results  Component Value Date   WBC 6.3 01/25/2023   HGB 13.6 01/25/2023   HCT 40.0 01/25/2023   MCV 94.0 01/25/2023   PLT 177 01/25/2023   Most recent BMP  Latest Ref Rng & Units 01/27/2023    5:31 AM  BMP  Glucose 70 - 99 mg/dL 99   BUN 8 - 23 mg/dL 13   Creatinine 1.61 - 1.24 mg/dL 0.96   Sodium 045 - 409 mmol/L 133   Potassium 3.5 -  5.1 mmol/L 4.3   Chloride 98 - 111 mmol/L 96   CO2 22 - 32 mmol/L 26   Calcium 8.9 - 10.3 mg/dL 9.6     Cyndia Skeeters, DO 01/27/2023, 11:54 AM  PGY-1, [facility] FPTS Intern pager: [phone], text pages welcome Secure chat group [facility] Teaching Service

## 2023-01-27 NOTE — Assessment & Plan Note (Addendum)
Large posterior right MCA territory infarct seen on CT head, MRI brain, and CTA head and neck.  Neurology was consulted, appreciate their recommendations; will reconsult as needed.  Plan for patient to follow-up in stroke clinic at [facility] in about 4 weeks. PT/OT has seen patient and agree patient is okay to return home when medically stable.  Patient is now at least 48 hours outside of event window; no longer requiring permissive hypertension, will plan to restart home antihypertensives as appropriate. Echocardiogram showed Grade 1 diastolic dysfunction with LVEF 65-70%; no intracardiac source of embolism was detected but recommended consideration of TEE for further workup. We will reach out to Neurology for their thoughts on TEE. -Neurology consulted, appreciate recommendations as below; will inquire about TEE. -Aspirin 81 mg daily -Resume Eliquis today -Restart home amlodipine; will continue to hold Diovan HCTZ as patient has has some low pressures.

## 2023-01-29 NOTE — Plan of Care (Signed)
[facility] Tonsillectomy/Adenoidectomy, Postoperative PEDS care plan entered in error.

## 2023-01-31 NOTE — TOC Progression Note (Signed)
Transition of Care [facility]) - Progression Note    Patient Details  Name: Jonathan Woodward MRN: [id number] Date of Birth: June 15, 1932  [facility] of [facility] CM/SW Contact  Kermit Balo, RN Phone Number: 01/31/2023, 3:51 PM  Clinical Narrative:     01/31/2023 at 1552: CM received a call from patients daughter, Jonathan Woodward. She states that the patient prefers home health therapy at this time. CM has called and cancelled his outpatient therapy and arranged HH with [facility]. Daughter is aware.   Expected Discharge Plan: OP Rehab Barriers to Discharge: Continued Medical Work up  Expected Discharge Plan and Services   Discharge Planning Services: CM Consult   Living arrangements for the past 2 months: Single Family Home Expected Discharge Date: 01/27/23                                     Social Determinants of Health (SDOH) Interventions SDOH Screenings   Food Insecurity: No Food Insecurity (01/26/2023)  Housing: Low Risk  (01/26/2023)  Transportation Needs: No Transportation Needs (01/26/2023)  Utilities: Not At Risk (01/26/2023)  Tobacco Use: Medium Risk (01/25/2023)    Readmission Risk Interventions     No data to display

## 2023-02-01 DIAGNOSIS — E7800 Pure hypercholesterolemia, unspecified: Secondary | ICD-10-CM | POA: Diagnosis not present

## 2023-02-01 DIAGNOSIS — I10 Essential (primary) hypertension: Secondary | ICD-10-CM | POA: Diagnosis not present

## 2023-02-01 DIAGNOSIS — Z8673 Personal history of transient ischemic attack (TIA), and cerebral infarction without residual deficits: Secondary | ICD-10-CM | POA: Diagnosis not present

## 2023-02-01 DIAGNOSIS — I4891 Unspecified atrial fibrillation: Secondary | ICD-10-CM | POA: Diagnosis not present

## 2023-02-07 ENCOUNTER — Encounter: Payer: Self-pay | Admitting: Neurology

## 2023-02-07 ENCOUNTER — Encounter: Payer: Self-pay | Admitting: Cardiovascular Disease

## 2023-02-24 ENCOUNTER — Ambulatory Visit: Payer: PPO | Admitting: Neurology

## 2023-02-24 ENCOUNTER — Encounter: Payer: Self-pay | Admitting: Neurology

## 2023-02-24 VITALS — BP 140/70 | HR 54 | Ht 66.0 in | Wt 184.6 lb

## 2023-02-24 DIAGNOSIS — E785 Hyperlipidemia, unspecified: Secondary | ICD-10-CM | POA: Diagnosis not present

## 2023-02-24 DIAGNOSIS — I6340 Cerebral infarction due to embolism of unspecified cerebral artery: Secondary | ICD-10-CM | POA: Diagnosis not present

## 2023-02-24 DIAGNOSIS — I4819 Other persistent atrial fibrillation: Secondary | ICD-10-CM | POA: Diagnosis not present

## 2023-02-24 DIAGNOSIS — I10 Essential (primary) hypertension: Secondary | ICD-10-CM | POA: Insufficient documentation

## 2023-02-24 NOTE — Patient Instructions (Addendum)
Recommend continue aspirin 81 mg daily and Eliquis for secondary stroke prevention   Strict management of vascular risk factors with a goal BP less than 130/90, A1c less than 7.0, LDL less than 70 for secondary stroke prevention  WITH THE SUPERVISION OF A LICENSED DRIVER, PLEASE DRIVE IN AN EMPTY PARKING LOT FOR AT LEAST 2-3 TRIALS TO TEST REACTION TIME, VISION, USE OF EQUIPMENT IN CAR, ETC.  IF SUCCESSFUL WITH THE PARKING LOT DRIVING, PROCEED TO SUPERVISED DRIVING TRIALS IN YOUR NEIGHBORHOOD STREETS AT LOW TRAFFIC TIMES TO TEST OBSERVATION TO TRAFFIC SIGNALS, REACTION TIME, ETC. PLEASE ATTEMPT AT LEAST 2-3 TRIALS IN YOUR NEIGHBORHOOD.  IF NEIGHBORHOOD DRIVING IS SUCCESSFUL, YOU MAY PROCEED TO DRIVING IN BUSIER AREAS IN YOUR COMMUNITY WITH SUPERVISION OF A LICENSED DRIVER. PLEASE ATTEMPT AT LEAST 4-5 TRIALS. DRIVING ON THE HIGHWAY AND NIGHTTIME DRIVING SHOULD BE THE LAST STEP TO RETURN, SHOULD BE SUPERVISED BY EXPERIENCED DRIVER.

## 2023-02-24 NOTE — Progress Notes (Signed)
Patient: Jonathan Woodward Date of Birth: 08-26-1932  Reason for Visit: Stroke Clinic Visit  History from: Patient, daughter, significant other  Primary Neurologist: Pearlean Brownie   ASSESSMENT AND PLAN 89 y.o. 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  Embolic pattern secondary to cardioembolic source with history of A-fib on Eliquis but does have large vessel disease.  Vascular risk factors HTN, HLD.  -He is doing very well.  Has no residual deficits.  Has resumed regular activities, enjoys working outside in the yard.  -Continue aspirin 81 mg daily and Eliquis for secondary stroke prevention.  History of A-fib on Eliquis. -He can slowly return to driving as long as supervised driver accompanies him for supervision  -He did not need any PT or OT -Recommend continue close follow-up with primary care Strict management of vascular risk factors with a goal BP less than 130/90, A1c less than 7.0, LDL less than 70 for secondary stroke prevention -Follow-up at our office on an as-needed basis  HISTORY OF PRESENT ILLNESS: Today 02/24/23 Here with his daughter, Jonathan Woodward, and significant other. Reports has been back to normal, weakness has resolved. Mental status is back to normal, has some forgetfulness for his baseline. He works outside in the yard, cuts 10 acres, has planted 300 flowers since his stroke. Has not been driving a car. Home health came out, he had already passed the goals, so he was discharged. He manages medication independently. He is on aspirin 81 mg daily and Eliquis BID. BP 154/62 in office, staying in the 140's at home. On valsartan hydrochlorothiazide, amlodipine. Sees cardiology in Jan 2025. MOCA 23/30.  HISTORY  Presented to the ER 01/25/2023 with confusion, left-sided weakness.  History of A-fib on Eliquis, with very good compliance. 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  Embolic pattern secondary to cardioembolic  source on Eliquis, but does have large vessel disease.  Started on Crestor 20.  Added aspirin 81 to daily Eliquis.  History of rheumatoid arthritis on methotrexate, prednisone.  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  No intracardiac source of embolism.  Considered TEE, but with discussion with neurology management would not change if TEE was obtained. -LDL 115 -A1c 5.1 -On Eliquis daily prior to admission, recommended aspirin 81 mg daily in addition to Eliquis  REVIEW OF SYSTEMS: Out of a complete 14 system review of symptoms, the patient complains only of the following symptoms, and all other reviewed systems are negative.  See HPI  ALLERGIES: No Known Allergies  HOME MEDICATIONS: Outpatient Medications Prior to Visit  Medication Sig Dispense Refill   acetaminophen (TYLENOL) 325 MG tablet Take 2 tablets (650 mg total) by mouth every 6 (six) hours as needed for mild pain (pain score 1-3) (or Fever >/= 101).     amLODipine (NORVASC) 10 MG tablet Take 10 mg by mouth daily.     apixaban (ELIQUIS) 5 MG TABS tablet Take 1 tablet (5 mg total) by mouth 2 (two) times daily. 60 tablet 0   aspirin EC 81 MG tablet Take 1 tablet (81 mg total) by mouth daily. Swallow whole. 30 tablet 12   cyanocobalamin (VITAMIN B12) 1000 MCG tablet Take 1,000 mcg by mouth daily.     docusate sodium (COLACE)  50 MG capsule Take 50 mg by mouth daily as needed for mild constipation.     Flaxseed, Linseed, (FLAXSEED OIL) 1200 MG CAPS Take 1,200 mg by mouth daily.     flecainide (TAMBOCOR) 50 MG tablet Take 1 tablet (50 mg total) by mouth 2 (two) times daily. Please contact the office to schedule appointment for additional refills. 180 tablet 1   folic acid (FOLVITE) 1 MG tablet Take 1 mg by mouth  daily.     methotrexate (RHEUMATREX) 2.5 MG tablet Take 12.5 mg by mouth once a week. Take 5 tablets by mouth weekly     metoprolol succinate (TOPROL-XL) 50 MG 24 hr tablet Take 1 tablet (50 mg total) by mouth daily. Take with or immediately following a meal. 30 tablet 0   Omega-3 Fatty Acids (FISH OIL) 1000 MG CAPS Take 1,000 mg by mouth in the morning.     predniSONE (DELTASONE) 5 MG tablet Take 5 mg by mouth daily with breakfast.     rosuvastatin (CRESTOR) 20 MG tablet Take 1 tablet (20 mg total) by mouth daily. 30 tablet 0   sennosides-docusate sodium (SENOKOT-S) 8.6-50 MG tablet Take 1 tablet by mouth daily as needed for constipation.     valsartan-hydrochlorothiazide (DIOVAN-HCT) 320-25 MG tablet Take 1 tablet by mouth daily.     No facility-administered medications prior to visit.    PAST MEDICAL HISTORY: Past Medical History:  Diagnosis Date   Arthritis    Rheumatoid Arthritis   Atrial fibrillation (HCC)    Dysrhythmia    Glaucoma    HTN (hypertension)     PAST SURGICAL HISTORY: Past Surgical History:  Procedure Laterality Date   CARDIOVERSION N/A 10/16/2021   Procedure: CARDIOVERSION;  Surgeon: Chrystie Nose, MD;  Location: [facility] ENDOSCOPY;  Service: Cardiovascular;  Laterality: N/A;   CARDIOVERSION N/A 12/29/2021   Procedure: CARDIOVERSION;  Surgeon: Parke Poisson, MD;  Location: [facility] ENDOSCOPY;  Service: Cardiovascular;  Laterality: N/A;   CATARACT EXTRACTION     TOTAL KNEE ARTHROPLASTY Left 03/03/2022   Procedure: TOTAL KNEE ARTHROPLASTY;  Surgeon: Joen Laura, MD;  Location: [facility] ORS;  Service: Orthopedics;  Laterality: Left;   TOTAL KNEE ARTHROPLASTY Right 07/02/2022   Procedure: TOTAL KNEE ARTHROPLASTY;  Surgeon: Joen Laura, MD;  Location: [facility] ORS;  Service: Orthopedics;  Laterality: Right;    FAMILY HISTORY: History reviewed. No pertinent family history.  SOCIAL HISTORY: Social History   Socioeconomic History   Marital status: Single     Spouse name: Not on file   Number of children: Not on file   Years of education: Not on file   Highest education level: Not on file  Occupational History   Not on file  Tobacco Use   Smoking status: Former    Current packs/day: 0.00    Types: Cigarettes, Pipe, Cigars    Quit date: 70    Years since quitting: 44.9   Smokeless tobacco: Former    Types: Chew, Snuff    Quit date: 1980  Vaping Use   Vaping status: Never Used  Substance and Sexual Activity   Alcohol use: Never   Drug use: Never   Sexual activity: Not Currently  Other Topics Concern   Not on file  Social History Narrative   Not on file   Social Determinants of Health   Financial Resource Strain: Not on file  Food Insecurity: No Food Insecurity (01/26/2023)   Hunger Vital Sign    Worried About Programme researcher, broadcasting/film/video in  the Last Year: Never true    Ran Out of Food in the Last Year: Never true  Transportation Needs: No Transportation Needs (01/26/2023)   PRAPARE - Administrator, Civil Service (Medical): No    Lack of Transportation (Non-Medical): No  Physical Activity: Not on file  Stress: Not on file  Social Connections: Not on file  Intimate Partner Violence: Not At Risk (01/26/2023)   Humiliation, Afraid, Rape, and Kick questionnaire    Fear of Current or Ex-Partner: No    Emotionally Abused: No    Physically Abused: No    Sexually Abused: No    PHYSICAL EXAM  Vitals:   02/24/23 1007  BP: (!) 154/62  Pulse: (!) 54  Weight: 184 lb 9.6 oz (83.7 kg)  Height: 5\' 6"  (1.676 m)   Body mass index is 29.8 kg/m.    02/24/2023   10:55 AM  Montreal Cognitive Assessment   Visuospatial/ Executive (0/5) 3  Naming (0/3) 3  Attention: Read list of digits (0/2) 2  Attention: Read list of letters (0/1) 1  Attention: Serial 7 subtraction starting at 100 (0/3) 3  Language: Repeat phrase (0/2) 2  Language : Fluency (0/1) 0  Abstraction (0/2) 2  Delayed Recall (0/5) 3  Orientation (0/6) 4  Total  23   Generalized: Well developed, in no acute distress  Neurological examination  Mentation: Alert oriented to time, place, history taking. Follows all commands speech and language fluent Cranial nerve II-XII: Pupils were equal round reactive to light. Extraocular movements were full, visual field were full on confrontational test. Facial sensation and strength were normal. Head turning and shoulder shrug  were normal and symmetric. Motor: The motor testing reveals 5 over 5 strength of all 4 extremities. Good symmetric motor tone is noted throughout.  Grip strength strong bilaterally. Sensory: Sensory testing is intact to soft touch on all 4 extremities. No evidence of extinction is noted.  Coordination: Cerebellar testing reveals good finger-nose-finger and heel-to-shin bilaterally.  Gait and station: Gait is normal.  Reflexes: Deep tendon reflexes are symmetric and normal bilaterally.   DIAGNOSTIC DATA (LABS, IMAGING, TESTING) - I reviewed patient records, labs, notes, testing and imaging myself where available.  Lab Results  Component Value Date   WBC 6.3 01/25/2023   HGB 13.6 01/25/2023   HCT 40.0 01/25/2023   MCV 94.0 01/25/2023   PLT 177 01/25/2023      Component Value Date/Time   NA 133 (L) 01/27/2023 0531   NA 135 12/22/2021 1114   K 4.3 01/27/2023 0531   CL 96 (L) 01/27/2023 0531   CO2 26 01/27/2023 0531   GLUCOSE 99 01/27/2023 0531   BUN 13 01/27/2023 0531   BUN 12 12/22/2021 1114   CREATININE 0.97 01/27/2023 0531   CALCIUM 9.6 01/27/2023 0531   PROT 6.7 01/25/2023 1843   ALBUMIN 3.7 01/25/2023 1843   AST 14 (L) 01/25/2023 1843   ALT 15 01/25/2023 1843   ALKPHOS 87 01/25/2023 1843   BILITOT 0.5 01/25/2023 1843   GFRNONAA >60 01/27/2023 0531   Lab Results  Component Value Date   CHOL 196 01/26/2023   HDL 63 01/26/2023   LDLCALC 115 (H) 01/26/2023   TRIG 91 01/26/2023   CHOLHDL 3.1 01/26/2023   Lab Results  Component Value Date   HGBA1C 5.1 01/26/2023    No results found for: "VITAMINB12" No results found for: "TSH"  Margie Ege, AGNP-C, DNP 02/24/2023, 10:17 AM [facility] [address], [address]  [place], [place] [address] [phone]

## 2023-03-02 DIAGNOSIS — R070 Pain in throat: Secondary | ICD-10-CM | POA: Diagnosis not present

## 2023-03-02 DIAGNOSIS — R051 Acute cough: Secondary | ICD-10-CM | POA: Diagnosis not present

## 2023-03-02 DIAGNOSIS — J069 Acute upper respiratory infection, unspecified: Secondary | ICD-10-CM | POA: Diagnosis not present

## 2023-03-28 DIAGNOSIS — M0579 Rheumatoid arthritis with rheumatoid factor of multiple sites without organ or systems involvement: Secondary | ICD-10-CM | POA: Diagnosis not present

## 2023-03-28 DIAGNOSIS — E669 Obesity, unspecified: Secondary | ICD-10-CM | POA: Diagnosis not present

## 2023-03-28 DIAGNOSIS — M353 Polymyalgia rheumatica: Secondary | ICD-10-CM | POA: Diagnosis not present

## 2023-03-28 DIAGNOSIS — M170 Bilateral primary osteoarthritis of knee: Secondary | ICD-10-CM | POA: Diagnosis not present

## 2023-03-28 DIAGNOSIS — Z6830 Body mass index (BMI) 30.0-30.9, adult: Secondary | ICD-10-CM | POA: Diagnosis not present

## 2023-03-31 ENCOUNTER — Other Ambulatory Visit: Payer: Self-pay

## 2023-03-31 ENCOUNTER — Other Ambulatory Visit (HOSPITAL_COMMUNITY): Payer: Self-pay

## 2023-04-21 ENCOUNTER — Telehealth: Payer: Self-pay

## 2023-04-21 NOTE — Telephone Encounter (Signed)
   Pre-operative Risk Assessment    Patient Name: Jonathan Woodward  DOB: 1932/10/16 MRN: [id number]  Date of last office visit: 07/19/22 Dr. Jerel  Date of next office visit: 04/29/23 Dr. Jerel     Request for Surgical Clearance    Procedure:   right reverse shoulder arthroplasty   Date of Surgery:  Clearance TBD                                 Surgeon:  Dr. Franky Pointer  Surgeon's Group or Practice Name:  [facility]  Phone number:  [phone]  Fax number:  [phone]   Type of Clearance Requested:   - Medical  - Pharmacy:  Hold Aspirin  and Apixaban  (Eliquis ) Not indicated    Type of Anesthesia:  General    Additional requests/questions:    [name]   04/21/2023, 11:49 AM

## 2023-04-21 NOTE — Telephone Encounter (Signed)
 Please advise holding Eliquis prior to right reverse shoulder arthroplasty.   Thank you!  DW

## 2023-04-21 NOTE — Telephone Encounter (Signed)
   Name: Jonathan Woodward  DOB: 08/07/1932  MRN: [id number]  Primary Cardiologist: Jerel Balding, MD  Chart reviewed as part of pre-operative protocol coverage. Because of [name]'s past medical history and time since last visit, he will require a follow-up in-office visit in order to better assess preoperative cardiovascular risk.  Patient has an office visit scheduled on 04/29/2023 with Dr. Balding. Appointment notes have been updated to reflect need for pre-op evaluation.   Pre-op covering staff:  - Please contact requesting surgeon's office via preferred method (i.e, phone, fax) to inform them of need for appointment prior to surgery.  This message will also be routed to pharmacy pool for input on holding Eliquis  as requested below so that this information is available to the clearing provider at time of patient's appointment.   [name], NP  04/21/2023, 5:06 PM

## 2023-04-22 NOTE — Telephone Encounter (Signed)
 Patient with diagnosis of afib on Eliquis  for anticoagulation.    Procedure: right reverse shoulder arthroplasty  Date of procedure: TBD   CHA2DS2-VASc Score = 5   This indicates a 7.2% annual risk of stroke. The patient's score is based upon: CHF History: 0 HTN History: 1 Diabetes History: 0 Stroke History: 2 Vascular Disease History: 0 Age Score: 2 Gender Score: 0      Patient with CVA on 01/27/23. I would recommend he wait until 6 months post CVA before holding anticoagulation, but will defer to Dr. JAYSON.  CrCl 62 ml/min Platelet count 177   **This guidance is not considered finalized until pre-operative APP has relayed final recommendations.**

## 2023-04-26 ENCOUNTER — Telehealth: Payer: Self-pay | Admitting: Neurology

## 2023-04-26 NOTE — Telephone Encounter (Signed)
 Received medical clearance from [facility] Dr. Melita for right total reverse shoulder arthroplasty.  Recommend surgery be postponed 3 to 6 months post CVA.  CVA in October 2024.  Highly recommend cardiology consult for Eliquis  management given A-fib and recent CVA embolic pattern secondary to cardioembolic source on Eliquis  in the setting of large vessel disease.  I reviewed recent phone notes from cardiology, looks like seeing Dr. JAYSON this week to discuss Eliquis  management.

## 2023-04-27 ENCOUNTER — Encounter: Payer: Self-pay | Admitting: Cardiovascular Disease

## 2023-04-27 ENCOUNTER — Telehealth: Payer: Self-pay

## 2023-04-27 NOTE — Telephone Encounter (Signed)
 Surgical clearance letter faxed to kevin supple [facility]

## 2023-04-29 ENCOUNTER — Ambulatory Visit: Payer: PPO | Attending: Cardiovascular Disease | Admitting: Cardiovascular Disease

## 2023-04-29 ENCOUNTER — Encounter: Payer: Self-pay | Admitting: Cardiovascular Disease

## 2023-04-29 VITALS — BP 122/52 | HR 56 | Ht 66.0 in | Wt 189.2 lb

## 2023-04-29 DIAGNOSIS — I519 Heart disease, unspecified: Secondary | ICD-10-CM

## 2023-04-29 DIAGNOSIS — I4819 Other persistent atrial fibrillation: Secondary | ICD-10-CM

## 2023-04-29 DIAGNOSIS — Z79899 Other long term (current) drug therapy: Secondary | ICD-10-CM | POA: Diagnosis not present

## 2023-04-29 DIAGNOSIS — E7800 Pure hypercholesterolemia, unspecified: Secondary | ICD-10-CM | POA: Diagnosis not present

## 2023-04-29 DIAGNOSIS — M0579 Rheumatoid arthritis with rheumatoid factor of multiple sites without organ or systems involvement: Secondary | ICD-10-CM | POA: Diagnosis not present

## 2023-04-29 DIAGNOSIS — Z01810 Encounter for preprocedural cardiovascular examination: Secondary | ICD-10-CM | POA: Diagnosis not present

## 2023-04-29 DIAGNOSIS — Z5181 Encounter for therapeutic drug level monitoring: Secondary | ICD-10-CM | POA: Diagnosis not present

## 2023-04-29 DIAGNOSIS — I10 Essential (primary) hypertension: Secondary | ICD-10-CM | POA: Diagnosis not present

## 2023-04-29 DIAGNOSIS — M069 Rheumatoid arthritis, unspecified: Secondary | ICD-10-CM | POA: Diagnosis not present

## 2023-04-29 DIAGNOSIS — M12811 Other specific arthropathies, not elsewhere classified, right shoulder: Secondary | ICD-10-CM | POA: Diagnosis not present

## 2023-04-29 DIAGNOSIS — D6869 Other thrombophilia: Secondary | ICD-10-CM | POA: Diagnosis not present

## 2023-04-29 DIAGNOSIS — I4891 Unspecified atrial fibrillation: Secondary | ICD-10-CM | POA: Diagnosis not present

## 2023-04-29 DIAGNOSIS — Z8673 Personal history of transient ischemic attack (TIA), and cerebral infarction without residual deficits: Secondary | ICD-10-CM | POA: Diagnosis not present

## 2023-04-29 DIAGNOSIS — Z131 Encounter for screening for diabetes mellitus: Secondary | ICD-10-CM | POA: Diagnosis not present

## 2023-04-29 NOTE — Patient Instructions (Signed)
Medication Instructions:  No changes *If you need a refill on your cardiac medications before your next appointment, please call your pharmacy*   Follow-Up: At [facility], you and your health needs are our priority.  As part of our continuing mission to provide you with exceptional heart care, we have created designated Provider Care Teams.  These Care Teams include your primary Cardiologist (physician) and Advanced Practice Providers (APPs -  Physician Assistants and Nurse Practitioners) who all work together to provide you with the care you need, when you need it.  We recommend signing up for the patient portal called "MyChart".  Sign up information is provided on this After Visit Summary.  MyChart is used to connect with patients for Virtual Visits (Telemedicine).  Patients are able to view lab/test results, encounter notes, upcoming appointments, etc.  Non-urgent messages can be sent to your provider as well.   To learn more about what you can do with MyChart, go to [email or web address].    Your next appointment:   6 month(s)  Provider:   Mihai Croitoru, MD     

## 2023-04-29 NOTE — Progress Notes (Unsigned)
Cardiology Office Note:    Date:  04/30/2023   ID:  Jonathan Woodward, DOB 1932/08/15, MRN [id number]  PCP:  Lonie Peak, PA-C   [facility] Providers Cardiologist:  Thurmon Fair, MD     Referring MD: Lonie Peak, PA-C   Chief Complaint  Patient presents with   Atrial Fibrillation   Pre-op Exam    History of Present Illness:    Jonathan Woodward is a 89 y.o. male with a hx of persistent atrial fibrillation status post successful cardioversion, rheumatoid arthritis of multiple joints, mild hypertension, knee osteoarthritis, history of carpal tunnel syndrome, who is planning left shoulder surgery with Dr. Rennis Chris.  He has done quite well since his last appointment.  He has not had any recurrence of atrial fibrillation since his cardioversion in September 2023 (early recurrence after medical attempt in July, maintained sinus rhythm after starting flecainide).  He denies problems with exertional angina or any dyspnea with usual activity, orthopnea, PND, edema or claudication.  He has not had any polyvalent bleeding problems.  Has not had problems with palpitations, dizziness/syncope and remains very physically active for his age.  He is limited more by his orthopedic and rheumatological problems.  He is on chronic therapy and methotrexate and a very low-dose of prednisone 5 mg daily (for seropositive RA, Dr. Dierdre Forth).  His blood pressure is well-controlled and in the office today his diastolic is actually a little low at 52.  He had no cardiovascular complications with bilateral knee replacement surgery (on the left side in November 2023 and on the right side on 07/02/2022).  He is unaware of the arrhythmia.  He is remarkably physically active for his age.  He does a lot of yard work, cutting down trees and clearing limbs.  He lives independently with his 88 year old significant other on a large property.  He had COVID-19 infection about a year ago and has noticed that he has  mild exertional dyspnea (NYHA functional class II) since then.   Past Medical History:  Diagnosis Date   Arthritis    Rheumatoid Arthritis   Atrial fibrillation (HCC)    Dysrhythmia    Glaucoma    HTN (hypertension)     Past Surgical History:  Procedure Laterality Date   CARDIOVERSION N/A 10/16/2021   Procedure: CARDIOVERSION;  Surgeon: Chrystie Nose, MD;  Location: [facility] END[facility]COPY;  Service: Cardiovascular;  Laterality: N/A;   CARDIOVERSION N/A 12/29/2021   Procedure: CARDIOVERSION;  Surgeon: Parke Poisson, MD;  Location: [facility] ENDOSCOPY;  Service: Cardiovascular;  Laterality: N/A;   CATARACT EXTRACTION     TOTAL KNEE ARTHROPLASTY Left 03/03/2022   Procedure: TOTAL KNEE ARTHROPLASTY;  Surgeon: Joen Laura, MD;  Location: [facility] ORS;  Service: Orthopedics;  Laterality: Left;   TOTAL KNEE ARTHROPLASTY Right 07/02/2022   Procedure: TOTAL KNEE ARTHROPLASTY;  Surgeon: Joen Laura, MD;  Location: [facility] ORS;  Service: Orthopedics;  Laterality: Right;    Current Medications: Current Meds  Medication Sig   acetaminophen (TYLENOL) 325 MG tablet Take 2 tablets (650 mg total) by mouth every 6 (six) hours as needed for mild pain (pain score 1-3) (or Fever >/= 101).   amLODipine (NORVASC) 10 MG tablet Take 10 mg by mouth daily.   apixaban (ELIQUIS) 5 MG TABS tablet Take 1 tablet (5 mg total) by mouth 2 (two) times daily.   aspirin EC 81 MG tablet Take 1 tablet (81 mg total) by mouth daily. Swallow whole.   cyanocobalamin (VITAMIN B12) 1000 MCG tablet  Take 1,000 mcg by mouth daily.   docusate sodium (COLACE) 50 MG capsule Take 50 mg by mouth daily as needed for mild constipation.   Flaxseed, Linseed, (FLAXSEED OIL) 1200 MG CAPS Take 1,200 mg by mouth daily.   flecainide (TAMBOCOR) 50 MG tablet Take 1 tablet (50 mg total) by mouth 2 (two) times daily. Please contact the office to schedule appointment for additional refills.   folic acid (FOLVITE) 1 MG tablet Take 1 mg by mouth  daily.   methotrexate (RHEUMATREX) 2.5 MG tablet Take 12.5 mg by mouth once a week. Take 5 tablets by mouth weekly   metoprolol succinate (TOPROL-XL) 50 MG 24 hr tablet Take 1 tablet (50 mg total) by mouth daily. Take with or immediately following a meal.   Omega-3 Fatty Acids (FISH OIL) 1000 MG CAPS Take 1,000 mg by mouth in the morning.   predniSONE (DELTASONE) 5 MG tablet Take 5 mg by mouth daily with breakfast.   rosuvastatin (CRESTOR) 20 MG tablet Take 1 tablet (20 mg total) by mouth daily.   sennosides-docusate sodium (SENOKOT-S) 8.6-50 MG tablet Take 1 tablet by mouth daily as needed for constipation.   valsartan-hydrochlorothiazide (DIOVAN-HCT) 320-25 MG tablet Take 1 tablet by mouth daily.     Allergies:   Patient has no known allergies.   Social History   Socioeconomic History   Marital status: Single    Spouse name: Not on file   Number of children: Not on file   Years of education: Not on file   Highest education level: Not on file  Occupational History   Not on file  Tobacco Use   Smoking status: Former    Current packs/day: 0.00    Types: Cigarettes, Pipe, Cigars    Quit date: 17    Years since quitting: 45.0   Smokeless tobacco: Former    Types: Chew, Snuff    Quit date: 1980  Vaping Use   Vaping status: Never Used  Substance and Sexual Activity   Alcohol use: Never   Drug use: Never   Sexual activity: Not Currently  Other Topics Concern   Not on file  Social History Narrative   Not on file   Social Drivers of Health   Financial Resource Strain: Not on file  Food Insecurity: No Food Insecurity (01/26/2023)   Hunger Vital Sign    Worried About Running Out of Food in the Last Year: Never true    Ran Out of Food in the Last Year: Never true  Transportation Needs: No Transportation Needs (01/26/2023)   PRAPARE - Administrator, Civil Service (Medical): No    Lack of Transportation (Non-Medical): No  Physical Activity: Not on file  Stress:  Not on file  Social Connections: Not on file     Family History: The patient's family history is significantly negative for cardiac illness or stroke  ROS:   Please see the history of present illness.     All other systems reviewed and are negative.  EKGs/Labs/Other Studies Reviewed:    The following studies were reviewed today:   Echocardiogram 10/09/2021    1. Left ventricular ejection fraction, by estimation, is 65 to 70%. The  left ventricle has normal function. The left ventricle has no regional  wall motion abnormalities. There is mild left ventricular hypertrophy.  Left ventricular diastolic parameters  are indeterminate. Elevated left ventricular end-diastolic pressure. The  E/e' is 19.   2. Right ventricular systolic function is mildly reduced. The right  ventricular size is mildly enlarged. There is moderately elevated  pulmonary artery systolic pressure. The estimated right ventricular  systolic pressure is 46.7 mmHg.   3. Left atrial size was severely dilated.   4. Right atrial size was moderately dilated.   5. The mitral valve is grossly normal. Trivial mitral valve  regurgitation. No evidence of mitral stenosis.   6. Tricuspid valve regurgitation is mild to moderate.   7. The aortic valve is grossly normal. There is mild calcification of the  aortic valve. Aortic valve regurgitation is trivial. No aortic stenosis is  present.   8. The inferior vena cava is normal in size with <50% respiratory  variability, suggesting right atrial pressure of 8 mmHg.   EKG:    EKG Interpretation Date/Time:  Friday April 29 2023 10:19:01 EST Ventricular Rate:  56 PR Interval:  240 QRS Duration:  152 QT Interval:  434 QTC Calculation: 418 R Axis:   44  Text Interpretation: Sinus bradycardia with 1st degree A-V block Right bundle branch block When compared with ECG of 25-Jan-2023 18:38, No significant change was found Confirmed by Jacolby Risby ([id number]) on 04/29/2023  10:50:22 AM         Recent Labs: 01/25/2023: ALT 15; Hemoglobin 13.6; Platelets 177 01/27/2023: BUN 13; Creatinine, Ser 0.97; Potassium 4.3; Sodium 133  Recent Lipid Panel    Component Value Date/Time   CHOL 196 01/26/2023 0507   TRIG 91 01/26/2023 0507   HDL 63 01/26/2023 0507   CHOLHDL 3.1 01/26/2023 0507   VLDL 18 01/26/2023 0507   LDLCALC 115 (H) 01/26/2023 0507   08/31/2021 Cholesterol 163, HDL 69, LDL 82, triglycerides 62  Risk Assessment/Calculations:    CHA2DS2-VASc Score = 5   This indicates a 7.2% annual risk of stroke. The patient's score is based upon: CHF History: 0 HTN History: 1 Diabetes History: 0 Stroke History: 2 Vascular Disease History: 0 Age Score: 2 Gender Score: 0            Physical Exam:    VS:  BP (!) 122/52 (BP Location: Left Arm, Patient Position: Sitting)   Pulse (!) 56   Ht 5\' 6"  (1.676 m)   Wt 189 lb 3.2 oz (85.8 kg)   BMI 30.54 kg/m     Wt Readings from Last 3 Encounters:  04/29/23 189 lb 3.2 oz (85.8 kg)  02/24/23 184 lb 9.6 oz (83.7 kg)  01/26/23 177 lb 7.5 oz (80.5 kg)     General: Alert, oriented x3, no distress, appears substantially younger than stated age. Head: no evidence of trauma, PERRL, EOMI, no exophtalmos or lid lag, no myxedema, no xanthelasma; normal ears, nose and oropharynx Neck: normal jugular venous pulsations and no hepatojugular reflux; brisk carotid pulses without delay and no carotid bruits Chest: clear to auscultation, no signs of consolidation by percussion or palpation, normal fremitus, symmetrical and full respiratory excursions Cardiovascular: normal position and quality of the apical impulse, regular rhythm, normal first and widely split second heart sounds, no murmurs, rubs or gallops Abdomen: no tenderness or distention, no masses by palpation, no abnormal pulsatility or arterial bruits, normal bowel sounds, no hepatosplenomegaly Extremities: no clubbing, cyanosis or edema; 2+ radial, ulnar  and brachial pulses bilaterally; 2+ right femoral, posterior tibial and dorsalis pedis pulses; 2+ left femoral, posterior tibial and dorsalis pedis pulses; no subclavian or femoral bruits Neurological: grossly nonfocal Psych: Normal mood and affect    ASSESSMENT:    1. Persistent atrial fibrillation (HCC)   2. Essential hypertension  3. Right ventricular dysfunction   4. Acquired thrombophilia (HCC)   5. Encounter for monitoring flecainide therapy   6. Rheumatoid arthritis involving multiple sites with positive rheumatoid factor (HCC)   7. Pre-operative cardiovascular examination        PLAN:    In order of problems listed above:  AFib: No clinically evident recurrence since cardioversion in September 2023.  Successfully maintaining sinus rhythm on flecainide and metoprolol.  CHA2DS2-VASc 3 on appropriate anticoagulation. HTN: Very well-controlled.  No symptoms of hypotension. RV dysfunction: Asymptomatic.  No change in his breathing status or stamina.  In fact feels much better after he started treatment for rheumatoid arthritis.  His echocardiogram in July 2023 when he had atrial fibrillation did show evidence of mildly reduced right ventricular systolic function and mild right ventricular dilation and PA pressure was elevated at roughly 47 mmHg.  He does not have symptoms of sleep apnea or any signs of right heart failure.  He smoked for a while, but quit 40 years ago and does not have typical findings of COPD.  Has RBBB on ECG.  Unfortunately we were unable to estimate the systolic PA pressure on his follow-up echo when he was in normal rhythm, but while the right ventricle remained mildly dilated, systolic function of the right ventricle have returned to normal.  I am not sure there would be much benefit from aggressive work-up for his pulmonary hypertension at this point.  Will monitor with an echocardiogram later this year. Anticoagulation: No bleeding problems. Flecainide: No  signs of proarrhythmia or toxicity clinically or by his EKG. History of carpal tunnel release: No other findings to raise suspicion for amyloidosis on his history.  No evidence of infiltrative cardiomyopathy on echocardiogram.  Normal voltage on ECG.  Does not have symptoms of heart failure. Rheumatoid arthritis: Substantial subjective improvement and objective reduction in the signs of inflammation in his digits.  On methotrexate therapy with Dr. Dierdre Forth.  Still on a very small dose of prednisone.  Reminded him that he may have iatrogenic adrenal suppression and if he has unexplained hypotension after surgery or doing other acute illnesses he may need stress doses of hydrocortisone. Preop CV eval: Low risk for major cardiovascular complications with planned shoulder surgery.  Okay to temporarily stop his anticoagulant.  Avoid interruptions in his metoprolol.    Medication Adjustments/Labs and Tests Ordered: Current medicines are reviewed at length with the patient today.  Concerns regarding medicines are outlined above.  Orders Placed This Encounter  Procedures   EKG 12-Lead   No orders of the defined types were placed in this encounter.   Patient Instructions  Medication Instructions:  No changes *If you need a refill on your cardiac medications before your next appointment, please call your pharmacy*  Follow-Up: At [facility], you and your health needs are our priority.  As part of our continuing mission to provide you with exceptional heart care, we have created designated Provider Care Teams.  These Care Teams include your primary Cardiologist (physician) and Advanced Practice Providers (APPs -  Physician Assistants and Nurse Practitioners) who all work together to provide you with the care you need, when you need it.  We recommend signing up for the patient portal called "MyChart".  Sign up information is provided on this After Visit Summary.  MyChart is used to connect with  patients for Virtual Visits (Telemedicine).  Patients are able to view lab/test results, encounter notes, upcoming appointments, etc.  Non-urgent messages can be sent  to your provider as well.   To learn more about what you can do with MyChart, go to [email or web address].    Your next appointment:   6 month(s)  Provider:   Thurmon Fair, MD             Signed, Thurmon Fair, MD  04/30/2023 5:31 PM    [facility]

## 2023-04-30 DIAGNOSIS — M0579 Rheumatoid arthritis with rheumatoid factor of multiple sites without organ or systems involvement: Secondary | ICD-10-CM | POA: Insufficient documentation

## 2023-05-04 DIAGNOSIS — Z961 Presence of intraocular lens: Secondary | ICD-10-CM | POA: Diagnosis not present

## 2023-05-04 DIAGNOSIS — H11001 Unspecified pterygium of right eye: Secondary | ICD-10-CM | POA: Diagnosis not present

## 2023-05-04 DIAGNOSIS — H52203 Unspecified astigmatism, bilateral: Secondary | ICD-10-CM | POA: Diagnosis not present

## 2023-05-04 DIAGNOSIS — D3132 Benign neoplasm of left choroid: Secondary | ICD-10-CM | POA: Diagnosis not present

## 2023-05-09 NOTE — Patient Instructions (Addendum)
SURGICAL WAITING ROOM VISITATION Patients having surgery or a procedure may have no more than 2 support people in the waiting area - these visitors may rotate in the visitor waiting room.   Due to an increase in RSV and influenza rates and associated hospitalizations, children ages 45 and under may not visit patients in [facility] hospitals. If the patient needs to stay at the hospital during part of their recovery, the visitor guidelines for inpatient rooms apply.  PRE-OP VISITATION  Pre-op nurse will coordinate an appropriate time for 1 support person to accompany the patient in pre-op.  This support person may not rotate.  This visitor will be contacted when the time is appropriate for the visitor to come back in the pre-op area.  Please refer to the [facility] website for the visitor guidelines for Inpatients (after your surgery is over and you are in a regular room).  You are not required to quarantine at this time prior to your surgery. However, you must do this: Hand Hygiene often Do NOT share personal items Notify your provider if you are in close contact with someone who has COVID or you develop fever 100.4 or greater, new onset of sneezing, cough, sore throat, shortness of breath or body aches.  If you test positive for Covid or have been in contact with anyone that has tested positive in the last 10 days please notify you surgeon.    Your procedure is scheduled on:    THURSDAY  May 19, 2023  Report to [facility] Main Entrance: [facility]sen entrance where the [employer] is available.   Report to admitting at: 07:15  AM  Call this number if you have any questions or problems the morning of surgery [phone]  Do not eat food after Midnight the night prior to your surgery/procedure.  After Midnight you may have the following liquids until  06:45  AM DAY OF SURGERY  Clear Liquid Diet Water Black Coffee (sugar ok, NO MILK/CREAM OR CREAMERS)  Tea (sugar ok, NO  MILK/CREAM OR CREAMERS) regular and decaf                             Plain Jell-O  with no fruit (NO RED)                                           Fruit ices (not with fruit pulp, NO RED)                                     Popsicles (NO RED)                                                                  Juice: NO CITRUS JUICES: only apple, WHITE grape, WHITE cranberry Sports drinks like Gatorade or Powerade (NO RED)                   The day of surgery:  Drink ONE (1) Pre-Surgery Clear Ensure at 06:45  AM the morning of surgery.  Drink in one sitting. Do not sip.  This drink was given to you during your hospital pre-op appointment visit. Nothing else to drink after completing the Pre-Surgery Clear Ensure : No candy, chewing gum or throat lozenges.    FOLLOW ANY ADDITIONAL PRE OP INSTRUCTIONS YOU RECEIVED FROM YOUR SURGEON'S OFFICE!!!   Oral Hygiene is also important to reduce your risk of infection.        Remember - BRUSH YOUR TEETH THE MORNING OF SURGERY WITH YOUR REGULAR TOOTHPASTE  Do NOT smoke after Midnight the night before surgery.  STOP TAKING all Vitamins, Herbs and supplements 1 week before your surgery.   Take ONLY these medicines the morning of surgery with A SIP OF WATER:  Amlodipine, metoprolol, Flecainide, and Tylenol if needed for pain.    If You have been diagnosed with Sleep Apnea - Bring CPAP mask and tubing day of surgery. We will provide you with a CPAP machine on the day of your surgery.                   You may not have any metal on your body including hair pins, jewelry, and body piercing  Do not wear make-up, lotions, powders, perfumes / cologne, or deodorant  Do not wear nail polish including gel and S&S, artificial / acrylic nails, or any other type of covering on natural nails including finger and toenails. If you have artificial nails, gel coating, etc., that needs to be removed by a nail salon, Please have this removed prior to surgery. Not doing  so may mean that your surgery could be cancelled or delayed if the Surgeon or anesthesia staff feels like they are unable to monitor you safely.   Do not shave 48 hours prior to surgery to avoid nicks in your skin which may contribute to postoperative infections.   Men may shave face and neck.  Contacts, Hearing Aids, dentures or bridgework may not be worn into surgery. DENTURES WILL BE REMOVED PRIOR TO SURGERY PLEASE DO NOT APPLY "Poly grip" OR ADHESIVES!!!  You may bring a small overnight bag with you on the day of surgery, only pack items that are not valuable. [facility] IS NOT RESPONSIBLE   FOR VALUABLES THAT ARE LOST OR STOLEN.   Patients discharged on the day of surgery will not be allowed to drive home.  Someone NEEDS to stay with you for the first 24 hours after anesthesia.  Do not bring your home medications to the hospital. The Pharmacy will dispense medications listed on your medication list to you during your admission in the Hospital.  Special Instructions: Bring a copy of your healthcare power of attorney and living will documents the day of surgery, if you wish to have them scanned into your [facility] Records- EPIC  Please read over the following fact sheets you were given: IF YOU HAVE QUESTIONS ABOUT YOUR PRE-OP INSTRUCTIONS, PLEASE CALL [phone].     Pre-operative 5 CHG Bath Instructions   You can play a key role in reducing the risk of infection after surgery. Your skin needs to be as free of germs as possible. You can reduce the number of germs on your skin by washing with CHG (chlorhexidine gluconate) soap before surgery. CHG is an antiseptic soap that kills germs and continues to kill germs even after washing.   DO NOT use if you have an allergy to chlorhexidine/CHG or antibacterial soaps. If your skin becomes reddened or irritated, stop using the CHG and notify  one of our RNs at [phone]  Please shower with the CHG soap starting 4 days before  surgery using the following schedule: START SHOWERS ON   Sunday May 15, 2023                                                                                                                                                                              Please keep in mind the following:  DO NOT shave, including legs and underarms, starting the day of your first shower.   You may shave your face at any point before/day of surgery.   Place clean sheets on your bed the day you start using CHG soap. Use a clean washcloth (not used since being washed) for each shower. DO NOT sleep with pets once you start using the CHG.   CHG Shower Instructions:  If you choose to wash your hair and private area, wash first with your normal shampoo/soap.  After you use shampoo/soap, rinse your hair and body thoroughly to remove shampoo/soap residue.  Turn the water OFF and apply about 3 tablespoons (45 ml) of CHG soap to a CLEAN washcloth.  Apply CHG soap ONLY FROM YOUR NECK DOWN TO YOUR TOES (washing for 3-5 minutes)  DO NOT use CHG soap on face, private areas, open wounds, or sores.  Pay special attention to the area where your surgery is being performed.  If you are having back surgery, having someone wash your back for you may be helpful.  Wait 2 minutes after CHG soap is applied, then you may rinse off the CHG soap.  Pat dry with a clean towel  Put on clean clothes/pajamas   If you choose to wear lotion, please use ONLY the CHG-compatible lotions on the back of this paper.     Additional instructions for the day of surgery: DO NOT APPLY any lotions, deodorants, cologne, or perfumes.   Put on clean/comfortable clothes.  Brush your teeth.  Ask your nurse before applying any prescription medications to the skin.      CHG Compatible Lotions   Aveeno Moisturizing lotion  Cetaphil Moisturizing Cream  Cetaphil Moisturizing Lotion  Clairol Herbal Essence Moisturizing Lotion, Dry Skin  Clairol  Herbal Essence Moisturizing Lotion, Extra Dry Skin  Clairol Herbal Essence Moisturizing Lotion, Normal Skin  Curel Age Defying Therapeutic Moisturizing Lotion with Alpha Hydroxy  Curel Extreme Care Body Lotion  Curel Soothing Hands Moisturizing Hand Lotion  Curel Therapeutic Moisturizing Cream, Fragrance-Free  Curel Therapeutic Moisturizing Lotion, Fragrance-Free  Curel Therapeutic Moisturizing Lotion, Original Formula  Eucerin Daily Replenishing Lotion  Eucerin Dry Skin Therapy Plus Alpha Hydroxy Crme  Eucerin Dry Skin Therapy Plus Alpha Hydroxy Lotion  Eucerin Original Crme  Eucerin Original Lotion  Eucerin Plus Crme Eucerin Plus Lotion  Eucerin TriLipid Replenishing Lotion  Keri Anti-Bacterial Hand Lotion  Keri Deep Conditioning Original Lotion Dry Skin Formula Softly Scented  Keri Deep Conditioning Original Lotion, Fragrance Free Sensitive Skin Formula  Keri Lotion Fast Absorbing Fragrance Free Sensitive Skin Formula  Keri Lotion Fast Absorbing Softly Scented Dry Skin Formula  Keri Original Lotion  Keri Skin Renewal Lotion Keri Silky Smooth Lotion  Keri Silky Smooth Sensitive Skin Lotion  Nivea Body Creamy Conditioning Oil  Nivea Body Extra Enriched Lotion  Nivea Body Original Lotion  Nivea Body Sheer Moisturizing Lotion Nivea Crme  Nivea Skin Firming Lotion  NutraDerm 30 Skin Lotion  NutraDerm Skin Lotion  NutraDerm Therapeutic Skin Cream  NutraDerm Therapeutic Skin Lotion  ProShield Protective Hand Cream  Provon moisturizing lotion      Preparing for Total Shoulder Arthroplasty ================================================================= Please follow these instructions carefully, in addition to any other special Bathing information that was explained to you at the Presurgical Appointment:  BENZOYL PEROXIDE 5% GEL: Used to kill bacteria on the skin which could cause an infection at the surgery site.   Please do not use if you have an allergy to benzoyl  peroxide. If your skin becomes reddened/irritated stop using the benzoyl peroxide and inform your Doctor.   Starting two days before surgery, apply as follows:  1. Apply benzoyl peroxide gel in the morning and at night. Apply after taking a shower. If you are not taking a shower, clean entire shoulder front, back, and side, along with the armpit with a clean wet washcloth.  2. Place a quarter-sized dollop of the gel on your SHOULDER and rub in thoroughly, making sure to cover the front, back, and side of your shoulder, along with the armpit.   2 Days prior to Surgery     TUESDAY  May 17, 2023 First Application _______ Morning Second Application _______ Night  Day Before Surgery     [facility]  May 18, 2023 First Application______ Morning  On the night before surgery, wash your entire body (except hair, face and private areas) with CHG Soap. THEN, rub in the LAST application of the Benzoyl Peroxide Gel on your shoulder.   3. On the Morning of Surgery wash your BODY AGAIN with CHG Soap (except hair, face and private areas)  4. DO NOT USE THE BENZOYL PEROXIDE GEL ON THE DAY OF YOUR SURGERY      FAILURE TO FOLLOW THESE INSTRUCTIONS MAY RESULT IN THE CANCELLATION OF YOUR SURGERY  PATIENT SIGNATURE_________________________________  NURSE SIGNATURE__________________________________  ________________________________________________________________________      Rogelia Mire    An incentive spirometer is a tool that can help keep your lungs clear and active. This tool measures how well you are filling your lungs with each breath. Taking long deep breaths may help reverse or decrease the chance of developing breathing (pulmonary) problems (especially infection) following: A long period of time when you are unable to move or be active. BEFORE THE PROCEDURE  If the spirometer includes an indicator to show your best effort, your nurse or respiratory therapist will set it  to a desired goal. If possible, sit up straight or lean slightly forward. Try not to slouch. Hold the incentive spirometer in an upright position. INSTRUCTIONS FOR USE  Sit on the edge of your bed if possible, or sit up as far as you can in bed or on a chair. Hold the incentive spirometer in an upright position. Breathe out normally. Place  the mouthpiece in your mouth and seal your lips tightly around it. Breathe in slowly and as deeply as possible, raising the piston or the ball toward the top of the column. Hold your breath for 3-5 seconds or for as long as possible. Allow the piston or ball to fall to the bottom of the column. Remove the mouthpiece from your mouth and breathe out normally. Rest for a few seconds and repeat Steps 1 through 7 at least 10 times every 1-2 hours when you are awake. Take your time and take a few normal breaths between deep breaths. The spirometer may include an indicator to show your best effort. Use the indicator as a goal to work toward during each repetition. After each set of 10 deep breaths, practice coughing to be sure your lungs are clear. If you have an incision (the cut made at the time of surgery), support your incision when coughing by placing a pillow or rolled up towels firmly against it. Once you are able to get out of bed, walk around indoors and cough well. You may stop using the incentive spirometer when instructed by your caregiver.  RISKS AND COMPLICATIONS Take your time so you do not get dizzy or light-headed. If you are in pain, you may need to take or ask for pain medication before doing incentive spirometry. It is harder to take a deep breath if you are having pain. AFTER USE Rest and breathe slowly and easily. It can be helpful to keep track of a log of your progress. Your caregiver can provide you with a simple table to help with this. If you are using the spirometer at home, follow these instructions: SEEK MEDICAL CARE IF:  You are  having difficultly using the spirometer. You have trouble using the spirometer as often as instructed. Your pain medication is not giving enough relief while using the spirometer. You develop fever of 100.5 F (38.1 C) or higher.                                                                                                    SEEK IMMEDIATE MEDICAL CARE IF:  You cough up bloody sputum that had not been present before. You develop fever of 102 F (38.9 C) or greater. You develop worsening pain at or near the incision site. MAKE SURE YOU:  Understand these instructions. Will watch your condition. Will get help right away if you are not doing well or get worse. Document Released: 08/09/2006 Document Revised: 06/21/2011 Document Reviewed: 10/10/2006 [facility] Patient Information 2014 [place], [place].    If you would like to see a video about joint replacement:   [email or web address]

## 2023-05-09 NOTE — Progress Notes (Addendum)
COVID Vaccine received:  []  No [x]  Yes Date of any COVID positive Test in last 90 days:   PCP - Lonie Peak PA-C [facility] [phone]   fax) [phone] Cardiologist - Thurmon Fair, MD   cardiac clearance 04-29-2023 note in Epic Neurology -  Delia Heady MD Rheumatology- Alben Deeds, MD   Chest x-ray -  EKG - 04-29-2023   Epic  Stress Test - Eugenie Birks 11-04-2021  epic ECHO - 01-27-2023   Epic Cardiac Cath -    PCR screen: [x]  Ordered & Completed                      []   No Order but Needs PROFEND                      []   N/A for this surgery   Surgery Plan:  [x]  Ambulatory                            []  Outpatient in bed                            []  Admit   Anesthesia:    [x]  General  []  Spinal                           []   Choice []   MAC   Pacemaker / ICD device [x]  No []  Yes   Spinal Cord Stimulator:[x]  No []  Yes        History of Sleep Apnea? [x]  No []  Yes   CPAP used?- [x]  No []  Yes     Does the patient monitor blood sugar? []  No []  Yes  [x]  N/A   Blood Thinner / Instructions: Eliquis  Hold 3 Days, ok'd by Dr. Royann Shivers 04-29-23 note Aspirin Instructions: ASA 81 mg  Hold x 5 days per daughter  Methotrexate- takes on Thursdays, called Dr. Shawnee Knapp office to approve Hold.  Per Dr. Erin Hearing clearance note: Reminded him that he may have iatrogenic adrenal suppression and if he has unexplained hypotension after surgery or doing other acute illnesses he may need stress doses of hydrocortisone.    ERAS Protocol Ordered: []  No  [x]  Yes PRE-SURGERY [x]  ENSURE  []  G2  Patient is to be NPO after: 06:15 am  Activity level: Patient can not climb a flight of stairs without difficulty; Patient can not perform ADLs without assistance.    Anesthesia review: recent CVA 01-25-2023, A. Fib- cardioversions x2, HTN, glaucoma, RA   Patient denies shortness of breath, fever, cough and chest pain at PAT appointment.   Patient verbalized  understanding and agreement to the Pre-Surgical Instructions that were given to them at this PAT appointment. Patient was also educated of the need to review these PAT instructions again prior to his surgery.I reviewed the appropriate phone numbers to call if they have any and questions or concerns.

## 2023-05-10 NOTE — Telephone Encounter (Signed)
Notes faxed to surgeon. This phone note will be removed from the preop pool. Tereso Newcomer, PA-C  05/10/2023 9:50 AM

## 2023-05-10 NOTE — Telephone Encounter (Signed)
See Dr. Royann Shivers note from 1/17.  Notes okay to hold anticoagulation for procedure

## 2023-05-11 ENCOUNTER — Encounter (HOSPITAL_COMMUNITY): Payer: Self-pay

## 2023-05-11 ENCOUNTER — Encounter (HOSPITAL_COMMUNITY)
Admission: RE | Admit: 2023-05-11 | Discharge: 2023-05-11 | Disposition: A | Discharge status: Home / Self Care | Payer: PPO | Source: Ambulatory Visit | Attending: Orthopedic Surgery | Admitting: Orthopedic Surgery

## 2023-05-11 ENCOUNTER — Other Ambulatory Visit: Payer: Self-pay

## 2023-05-11 VITALS — BP 128/55 | HR 56 | Temp 98.6°F | Resp 16 | Ht 66.0 in | Wt 186.6 lb

## 2023-05-11 DIAGNOSIS — Z7901 Long term (current) use of anticoagulants: Secondary | ICD-10-CM | POA: Diagnosis not present

## 2023-05-11 DIAGNOSIS — I480 Paroxysmal atrial fibrillation: Secondary | ICD-10-CM | POA: Insufficient documentation

## 2023-05-11 DIAGNOSIS — Z79631 Long term (current) use of antimetabolite agent: Secondary | ICD-10-CM

## 2023-05-11 DIAGNOSIS — I2510 Atherosclerotic heart disease of native coronary artery without angina pectoris: Secondary | ICD-10-CM | POA: Insufficient documentation

## 2023-05-11 DIAGNOSIS — Z8673 Personal history of transient ischemic attack (TIA), and cerebral infarction without residual deficits: Secondary | ICD-10-CM | POA: Diagnosis not present

## 2023-05-11 DIAGNOSIS — I5032 Chronic diastolic (congestive) heart failure: Secondary | ICD-10-CM | POA: Insufficient documentation

## 2023-05-11 DIAGNOSIS — Z01812 Encounter for preprocedural laboratory examination: Secondary | ICD-10-CM | POA: Insufficient documentation

## 2023-05-11 DIAGNOSIS — M75101 Unspecified rotator cuff tear or rupture of right shoulder, not specified as traumatic: Secondary | ICD-10-CM | POA: Diagnosis not present

## 2023-05-11 DIAGNOSIS — Z01818 Encounter for other preprocedural examination: Secondary | ICD-10-CM

## 2023-05-11 DIAGNOSIS — Z87891 Personal history of nicotine dependence: Secondary | ICD-10-CM | POA: Diagnosis not present

## 2023-05-11 DIAGNOSIS — M069 Rheumatoid arthritis, unspecified: Secondary | ICD-10-CM | POA: Insufficient documentation

## 2023-05-11 DIAGNOSIS — I110 Hypertensive heart disease with heart failure: Secondary | ICD-10-CM | POA: Diagnosis not present

## 2023-05-11 LAB — COMPREHENSIVE METABOLIC PANEL
ALT: 20 U/L (ref 0–44)
AST: 16 U/L (ref 15–41)
Albumin: 3.8 g/dL (ref 3.5–5.0)
Alkaline Phosphatase: 70 U/L (ref 38–126)
Anion gap: 7 (ref 5–15)
BUN: 23 mg/dL (ref 8–23)
CO2: 29 mmol/L (ref 22–32)
Calcium: 9.4 mg/dL (ref 8.9–10.3)
Chloride: 98 mmol/L (ref 98–111)
Creatinine, Ser: 1.22 mg/dL (ref 0.61–1.24)
GFR, Estimated: 57 mL/min — ABNORMAL LOW (ref >60)
Glucose, Bld: 84 mg/dL (ref 70–99)
Potassium: 4.1 mmol/L (ref 3.5–5.1)
Sodium: 134 mmol/L — ABNORMAL LOW (ref 135–145)
Total Bilirubin: 0.6 mg/dL (ref 0.0–1.2)
Total Protein: 7 g/dL (ref 6.5–8.1)

## 2023-05-11 LAB — CBC
HCT: 37.2 % — ABNORMAL LOW (ref 39.0–52.0)
Hemoglobin: 12.2 g/dL — ABNORMAL LOW (ref 13.0–17.0)
MCH: 33.2 pg (ref 26.0–34.0)
MCHC: 32.8 g/dL (ref 30.0–36.0)
MCV: 101.4 fL — ABNORMAL HIGH (ref 80.0–100.0)
Platelets: 157 10*3/uL (ref 150–400)
RBC: 3.67 MIL/uL — ABNORMAL LOW (ref 4.22–5.81)
RDW: 13.7 % (ref 11.5–15.5)
WBC: 6.8 10*3/uL (ref 4.0–10.5)
nRBC: 0 % (ref 0.0–0.2)

## 2023-05-12 LAB — SURGICAL PCR SCREEN
MRSA, PCR: NEGATIVE
Staphylococcus aureus: NEGATIVE

## 2023-05-12 NOTE — Progress Notes (Signed)
Case: [id number] Date/Time: 05/19/23 0939   Procedure: REVERSE SHOULDER ARTHROPLASTY (Right: Shoulder) -   Anesthesia type: General   Pre-op diagnosis: Right shoulder rotator cuff tear arthropathy   Location: [facility] ROOM 06 / [facility]   Surgeons: Francena Hanly, MD       DISCUSSION: Jonathan Woodward is an 89 yo male who presents to PAT prior to surgery above. PMH of former smoking, HTN, PAF on eliquis, diastolic dysfunction, hx of CVA (01/2023), RA.  Patient follows with Cardiology for hx of A.fib and HFpEF.  He underwent DCCV in 12/2021. He takes Flecainide and Metoprolol. Takes Eliquis as well. Last seen on 04/29/23 and noted to be doing well. Cleared for surgery:  "Preop CV eval: Low risk for major cardiovascular complications with planned shoulder surgery.  Okay to temporarily stop his anticoagulant.  Avoid interruptions in his metoprolol."  Patient had a CVA on 01/25/23. He presented to confusion and difficulty with fine motor skills. MRI showed acute right MCA territory infarct. CTA Head/Neck showed concern for acute occlusion of right distal MCA. Echocardiogram obtained to assess for possible origin of clot; this showed grade 1 diastolic dysfunction with LVEF 65 to 70% but no intracardiac source of embolism. Neurology recommended add ASA to Eliquis. He followed up with Neurology as an outpatient. They noted no residual deficits. They advised wait 3-6 months post CVA prior to surgery.  Patient follows with Rheumatology for RA. He is on methotrexate and low dose prednisone chronically.  Eliquis  Hold 3 Days  VS: BP (!) 128/55 Comment: right arm sitting  Pulse (!) 56   Temp 37 C (Oral)   Resp 16   Ht 5\' 6"  (1.676 m)   Wt 84.6 kg   SpO2 100%   BMI 30.12 kg/m   PROVIDERS: Lonie Peak, PA-C Cardiology: Thurmon Fair, MD  Neurology: Delia Heady MD Rheumatology- Alben Deeds, MD  LABS: Labs reviewed: Acceptable for surgery. (all labs ordered are listed, but only abnormal  results are displayed)  Labs Reviewed  COMPREHENSIVE METABOLIC PANEL - Abnormal; Notable for the following components:      Result Value   Sodium 134 (*)    GFR, Estimated 57 (*)    All other components within normal limits  CBC - Abnormal; Notable for the following components:   RBC 3.67 (*)    Hemoglobin 12.2 (*)    HCT 37.2 (*)    MCV 101.4 (*)    All other components within normal limits  SURGICAL PCR SCREEN     IMAGES:  CTA Head/Neck 01/25/2023:  IMPRESSION: 1. Focal calcification in a distal right MCA branch, with severe stenosis/poor opacification of its branch vessels, concerning for acute occlusion as these vessels appear to supply the area of acute infarct in the posterior right MCA territory. 2. 50% stenosis in the bilateral proximal ICAs. 3. 60% stenosis in the proximal brachiocephalic artery. 4. Moderate stenosis at the origin of the left vertebral artery and mild stenosis at the origin of the right vertebral artery. 5. Aortic atherosclerosis.   Aortic Atherosclerosis (ICD10-I70.0).  EKG 04/29/23  Sinus bradycardia with 1st degree A-V block Right bundle branch block  CV:  Echo 01/27/23:  IMPRESSIONS    1. Left ventricular ejection fraction, by estimation, is 65 to 70%. The left ventricle has normal function. The left ventricle has no regional wall motion abnormalities. There is mild concentric left ventricular hypertrophy. Left ventricular diastolic parameters are consistent with Grade I diastolic dysfunction (impaired relaxation).  2. Right ventricular systolic  function is normal. The right ventricular size is mildly enlarged.  3. Left atrial size was mildly dilated.  4. The mitral valve is degenerative. Mild mitral valve regurgitation. No evidence of mitral stenosis.  5. The aortic valve is tricuspid. There is mild calcification of the aortic valve. Aortic valve regurgitation is mild. Aortic valve sclerosis is present, with no evidence of  aortic valve stenosis.  Comparison(s): No significant change from prior study.  Conclusion(s)/Recommendation(s): No intracardiac source of embolism detected on this transthoracic study. Consider a transesophageal echocardiogram to exclude cardiac source of embolism if clinically indicated.  Stress test 11/04/21     Findings are consistent with no prior ischemia. The study is low risk.   No ST deviation was noted.   Left ventricular function is abnormal. Global function is mildly reduced. Nuclear stress EF: 49 %. The left ventricular ejection fraction is mildly decreased (45-54%). End diastolic cavity size is normal.   Prior study not available for comparison.   Low risk stress nuclear study with normal perfusion. There is mild reduction in left ventricular systolic function, but this may be related to poor gating in the setting of atrial fibrillation.  Past Medical History:  Diagnosis Date   Arthritis    Rheumatoid Arthritis   Atrial fibrillation (HCC)    Dysrhythmia    Glaucoma    HTN (hypertension)    Stroke (HCC) 01/25/2023   no residule problems    Past Surgical History:  Procedure Laterality Date   CARDIOVERSION N/A 10/16/2021   Procedure: CARDIOVERSION;  Surgeon: Chrystie Nose, MD;  Location: [facility] ENDOSCOPY;  Service: Cardiovascular;  Laterality: N/A;   CARDIOVERSION N/A 12/29/2021   Procedure: CARDIOVERSION;  Surgeon: Parke Poisson, MD;  Location[facility] ENDOSCOPY;  Service: Cardiovascular;  Laterality: N/A;   CATARACT EXTRACTION     TOTAL KNEE ARTHROPLASTY Left 03/03/2022   Procedure: TOTAL KNEE ARTHROPLASTY;  Surgeon: Joen Laura, MD;  Location: [facility] ORS;  Service: Orthopedics;  Laterality: Left;   TOTAL KNEE ARTHROPLASTY Right 07/02/2022   Procedure: TOTAL KNEE ARTHROPLASTY;  Surgeon: Joen Laura, MD;  Location: [facility]S;  Service: Orthopedics;  Laterality: Right;    MEDICATIONS:  acetaminophen (TYLENOL) 325 MG tablet   amLODipine (NORVASC) 10 MG  tablet   apixaban (ELIQUIS) 5 MG TABS tablet   aspirin EC 81 MG tablet   cyanocobalamin (VITAMIN B12) 1000 MCG tablet   docusate sodium (COLACE) 50 MG capsule   Flaxseed, Linseed, (FLAXSEED OIL) 1200 MG CAPS   flecainide (TAMBOCOR) 50 MG tablet   folic acid (FOLVITE) 1 MG tablet   methotrexate (RHEUMATREX) 2.5 MG tablet   metoprolol succinate (TOPROL-XL) 50 MG 24 hr tablet   Omega-3 Fatty Acids (FISH OIL) 1000 MG CAPS   predniSONE (DELTASONE) 5 MG tablet   rosuvastatin (CRESTOR) 20 MG tablet   sennosides-docusate sodium (SENOKOT-S) 8.6-50 MG tablet   valsartan-hydrochlorothiazide (DIOVAN-HCT) 320-25 MG tablet   No current facility-administered medications for this encounter.   Marcille Blanco [facility] Surgical Short Stay/Anesthesiology [facility] Phone [phone] 05/12/2023 10:29 AM

## 2023-05-19 ENCOUNTER — Ambulatory Visit (HOSPITAL_COMMUNITY)
Admission: RE | Admit: 2023-05-19 | Discharge: 2023-05-19 | Disposition: A | Discharge status: Home / Self Care | Payer: PPO | Attending: Orthopedic Surgery | Admitting: Orthopedic Surgery

## 2023-05-19 ENCOUNTER — Ambulatory Visit (HOSPITAL_COMMUNITY): Payer: Self-pay | Admitting: Anesthesiology

## 2023-05-19 ENCOUNTER — Encounter (HOSPITAL_COMMUNITY)
Admission: RE | Disposition: A | Discharge status: Home / Self Care | Payer: Self-pay | Source: Home / Self Care | Attending: Orthopedic Surgery

## 2023-05-19 ENCOUNTER — Other Ambulatory Visit: Payer: Self-pay

## 2023-05-19 ENCOUNTER — Encounter (HOSPITAL_COMMUNITY): Payer: Self-pay | Admitting: Orthopedic Surgery

## 2023-05-19 ENCOUNTER — Ambulatory Visit (HOSPITAL_COMMUNITY): Payer: PPO | Admitting: Medical

## 2023-05-19 DIAGNOSIS — I639 Cerebral infarction, unspecified: Secondary | ICD-10-CM | POA: Diagnosis not present

## 2023-05-19 DIAGNOSIS — I10 Essential (primary) hypertension: Secondary | ICD-10-CM

## 2023-05-19 DIAGNOSIS — I4891 Unspecified atrial fibrillation: Secondary | ICD-10-CM | POA: Insufficient documentation

## 2023-05-19 DIAGNOSIS — Z87891 Personal history of nicotine dependence: Secondary | ICD-10-CM | POA: Diagnosis not present

## 2023-05-19 DIAGNOSIS — Z79899 Other long term (current) drug therapy: Secondary | ICD-10-CM | POA: Insufficient documentation

## 2023-05-19 DIAGNOSIS — M069 Rheumatoid arthritis, unspecified: Secondary | ICD-10-CM | POA: Diagnosis not present

## 2023-05-19 DIAGNOSIS — M75101 Unspecified rotator cuff tear or rupture of right shoulder, not specified as traumatic: Secondary | ICD-10-CM | POA: Diagnosis not present

## 2023-05-19 DIAGNOSIS — M12811 Other specific arthropathies, not elsewhere classified, right shoulder: Secondary | ICD-10-CM | POA: Diagnosis not present

## 2023-05-19 DIAGNOSIS — I4819 Other persistent atrial fibrillation: Secondary | ICD-10-CM | POA: Diagnosis not present

## 2023-05-19 DIAGNOSIS — G8918 Other acute postprocedural pain: Secondary | ICD-10-CM | POA: Diagnosis not present

## 2023-05-19 HISTORY — PX: REVERSE SHOULDER ARTHROPLASTY: SHX5054

## 2023-05-19 SURGERY — ARTHROPLASTY, SHOULDER, TOTAL, REVERSE
Anesthesia: Regional | Site: Shoulder | Laterality: Right

## 2023-05-19 MED ORDER — LIDOCAINE HCL (CARDIAC) PF 100 MG/5ML IV SOSY
PREFILLED_SYRINGE | INTRAVENOUS | Status: DC | PRN
  Administered 2023-05-19: 80 mg via INTRAVENOUS

## 2023-05-19 MED ORDER — BUPIVACAINE HCL (PF) 0.5 % IJ SOLN
INTRAMUSCULAR | Status: DC | PRN
  Administered 2023-05-19: 15 mL via PERINEURAL

## 2023-05-19 MED ORDER — LACTATED RINGERS IV SOLN: INTRAVENOUS | Status: DC

## 2023-05-19 MED ORDER — STERILE WATER FOR IRRIGATION IR SOLN
Status: DC | PRN
  Administered 2023-05-19: 1000 mL

## 2023-05-19 MED ORDER — CHLORHEXIDINE GLUCONATE 0.12 % MT SOLN
15.0000 mL | Freq: Once | OROMUCOSAL | Status: AC
  Administered 2023-05-19: 15 mL via OROMUCOSAL

## 2023-05-19 MED ORDER — OXYCODONE-ACETAMINOPHEN 5-325 MG PO TABS: 1.0000 | ORAL_TABLET | ORAL | 0 refills | Status: AC | PRN

## 2023-05-19 MED ORDER — ONDANSETRON HCL 4 MG PO TABS: 4.0000 mg | ORAL_TABLET | Freq: Three times a day (TID) | ORAL | 0 refills | Status: AC | PRN

## 2023-05-19 MED ORDER — DEXAMETHASONE SODIUM PHOSPHATE 4 MG/ML IJ SOLN
INTRAMUSCULAR | Status: DC | PRN
  Administered 2023-05-19: 8 mg via INTRAVENOUS

## 2023-05-19 MED ORDER — PROPOFOL 1000 MG/100ML IV EMUL
INTRAVENOUS | Status: AC
  Filled 2023-05-19: qty 100

## 2023-05-19 MED ORDER — SUGAMMADEX SODIUM 200 MG/2ML IV SOLN
INTRAVENOUS | Status: DC | PRN
  Administered 2023-05-19 (×2): 200 mg via INTRAVENOUS

## 2023-05-19 MED ORDER — FENTANYL CITRATE PF 50 MCG/ML IJ SOSY: 25.0000 ug | PREFILLED_SYRINGE | INTRAMUSCULAR | Status: DC | PRN

## 2023-05-19 MED ORDER — ROCURONIUM BROMIDE 100 MG/10ML IV SOLN
INTRAVENOUS | Status: DC | PRN
  Administered 2023-05-19: 50 mg via INTRAVENOUS
  Administered 2023-05-19: 20 mg via INTRAVENOUS

## 2023-05-19 MED ORDER — ONDANSETRON HCL 4 MG/2ML IJ SOLN: 4.0000 mg | Freq: Once | INTRAMUSCULAR | Status: DC | PRN

## 2023-05-19 MED ORDER — FENTANYL CITRATE PF 50 MCG/ML IJ SOSY
50.0000 ug | PREFILLED_SYRINGE | INTRAMUSCULAR | Status: DC
  Administered 2023-05-19: 100 ug via INTRAVENOUS
  Filled 2023-05-19: qty 2

## 2023-05-19 MED ORDER — PROPOFOL 500 MG/50ML IV EMUL
INTRAVENOUS | Status: AC
  Filled 2023-05-19: qty 50

## 2023-05-19 MED ORDER — PHENYLEPHRINE HCL (PRESSORS) 10 MG/ML IV SOLN
INTRAVENOUS | Status: DC | PRN
  Administered 2023-05-19: 160 ug via INTRAVENOUS

## 2023-05-19 MED ORDER — ALBUMIN HUMAN 5 % IV SOLN: INTRAVENOUS | Status: DC | PRN

## 2023-05-19 MED ORDER — ACETAMINOPHEN 325 MG PO TABS: 325.0000 mg | ORAL_TABLET | ORAL | Status: DC | PRN

## 2023-05-19 MED ORDER — ACETAMINOPHEN 160 MG/5ML PO SOLN: 325.0000 mg | ORAL | Status: DC | PRN

## 2023-05-19 MED ORDER — DEXAMETHASONE SODIUM PHOSPHATE 10 MG/ML IJ SOLN
INTRAMUSCULAR | Status: AC
  Filled 2023-05-19: qty 1

## 2023-05-19 MED ORDER — CEFAZOLIN SODIUM-DEXTROSE 2-4 GM/100ML-% IV SOLN
2.0000 g | INTRAVENOUS | Status: DC
  Filled 2023-05-19: qty 100

## 2023-05-19 MED ORDER — PHENYLEPHRINE 80 MCG/ML (10ML) SYRINGE FOR IV PUSH (FOR BLOOD PRESSURE SUPPORT)
PREFILLED_SYRINGE | INTRAVENOUS | Status: AC
  Filled 2023-05-19: qty 10

## 2023-05-19 MED ORDER — MEPERIDINE HCL 50 MG/ML IJ SOLN: 6.2500 mg | INTRAMUSCULAR | Status: DC | PRN

## 2023-05-19 MED ORDER — ONDANSETRON HCL 4 MG/2ML IJ SOLN
INTRAMUSCULAR | Status: DC | PRN
  Administered 2023-05-19: 4 mg via INTRAVENOUS

## 2023-05-19 MED ORDER — SODIUM CHLORIDE 0.9 % IV SOLN: INTRAVENOUS | Status: DC | PRN

## 2023-05-19 MED ORDER — ROCURONIUM BROMIDE 10 MG/ML (PF) SYRINGE
PREFILLED_SYRINGE | INTRAVENOUS | Status: AC
  Filled 2023-05-19: qty 10

## 2023-05-19 MED ORDER — ORAL CARE MOUTH RINSE: 15.0000 mL | Freq: Once | OROMUCOSAL | Status: AC

## 2023-05-19 MED ORDER — BUPIVACAINE LIPOSOME 1.3 % IJ SUSP
INTRAMUSCULAR | Status: DC | PRN
  Administered 2023-05-19: 10 mL via PERINEURAL

## 2023-05-19 MED ORDER — VANCOMYCIN HCL 1000 MG IV SOLR
INTRAVENOUS | Status: DC | PRN
  Administered 2023-05-19: 1000 mg via TOPICAL

## 2023-05-19 MED ORDER — ONDANSETRON HCL 4 MG/2ML IJ SOLN
INTRAMUSCULAR | Status: AC
  Filled 2023-05-19: qty 2

## 2023-05-19 MED ORDER — PROPOFOL 500 MG/50ML IV EMUL
INTRAVENOUS | Status: DC | PRN
  Administered 2023-05-19: 110 ug/kg/min via INTRAVENOUS
  Administered 2023-05-19: 150 ug/kg/min via INTRAVENOUS

## 2023-05-19 MED ORDER — LIDOCAINE HCL (PF) 2 % IJ SOLN
INTRAMUSCULAR | Status: AC
  Filled 2023-05-19: qty 5

## 2023-05-19 MED ORDER — OXYCODONE HCL 5 MG/5ML PO SOLN: 5.0000 mg | Freq: Once | ORAL | Status: DC | PRN

## 2023-05-19 MED ORDER — ALBUMIN HUMAN 5 % IV SOLN
INTRAVENOUS | Status: AC
  Filled 2023-05-19: qty 250

## 2023-05-19 MED ORDER — 0.9 % SODIUM CHLORIDE (POUR BTL) OPTIME
TOPICAL | Status: DC | PRN
  Administered 2023-05-19: 1000 mL

## 2023-05-19 MED ORDER — TIZANIDINE HCL 4 MG PO TABS: 4.0000 mg | ORAL_TABLET | Freq: Four times a day (QID) | ORAL | 1 refills | Status: AC | PRN

## 2023-05-19 MED ORDER — OXYCODONE HCL 5 MG PO TABS: 5.0000 mg | ORAL_TABLET | Freq: Once | ORAL | Status: DC | PRN

## 2023-05-19 MED ORDER — PHENYLEPHRINE HCL-NACL 20-0.9 MG/250ML-% IV SOLN
INTRAVENOUS | Status: DC | PRN
  Administered 2023-05-19: 25 ug/min via INTRAVENOUS

## 2023-05-19 MED ORDER — TRANEXAMIC ACID-NACL 1000-0.7 MG/100ML-% IV SOLN
1000.0000 mg | INTRAVENOUS | Status: AC
  Administered 2023-05-19: 1000 mg via INTRAVENOUS
  Filled 2023-05-19: qty 100

## 2023-05-19 SURGICAL SUPPLY — 63 items
BAG COUNTER SPONGE SURGICOUNT (BAG) IMPLANT
BAG ZIPLOCK 12X15 (MISCELLANEOUS) ×1 IMPLANT
BIT DRILL AR 3 NS (BIT) IMPLANT
BLADE SAW SGTL 83.5X18.5 (BLADE) ×1 IMPLANT
BNDG COHESIVE 4X5 TAN STRL LF (GAUZE/BANDAGES/DRESSINGS) ×1 IMPLANT
COOLER ICEMAN CLASSIC (MISCELLANEOUS) ×1 IMPLANT
COVER BACK TABLE 60X90IN (DRAPES) ×1 IMPLANT
COVER SURGICAL LIGHT HANDLE (MISCELLANEOUS) ×1 IMPLANT
CUP SUT UNIV REVERS 39 NEU (Shoulder) IMPLANT
DRAPE SHEET LG 3/4 BI-LAMINATE (DRAPES) ×1 IMPLANT
DRAPE SURG 17X11 SM STRL (DRAPES) ×1 IMPLANT
DRAPE SURG ORHT 6 SPLT 77X108 (DRAPES) ×2 IMPLANT
DRAPE TOP 10253 STERILE (DRAPES) ×1 IMPLANT
DRAPE U-SHAPE 47X51 STRL (DRAPES) ×1 IMPLANT
DRESSING AQUACEL AG SP 3.5X6 (GAUZE/BANDAGES/DRESSINGS) ×1 IMPLANT
DRSG AQUACEL AG ADV 3.5X 6 (GAUZE/BANDAGES/DRESSINGS) IMPLANT
DRSG AQUACEL AG ADV 3.5X10 (GAUZE/BANDAGES/DRESSINGS) IMPLANT
DRSG AQUACEL AG SP 3.5X6 (GAUZE/BANDAGES/DRESSINGS) ×1
DURAPREP 26ML APPLICATOR (WOUND CARE) ×1 IMPLANT
ELECT BLADE TIP CTD 4 INCH (ELECTRODE) ×1 IMPLANT
ELECT PENCIL ROCKER SW 15FT (MISCELLANEOUS) ×1 IMPLANT
ELECT REM PT RETURN 15FT ADLT (MISCELLANEOUS) ×1 IMPLANT
FACESHIELD WRAPAROUND (MASK) ×5
FACESHIELD WRAPAROUND OR TEAM (MASK) ×5 IMPLANT
GLENOID UNI REV MOD 24 +2 LAT (Joint) IMPLANT
GLENOSPHERE 39+4 LAT/24 UNI RV (Joint) IMPLANT
GLOVE BIO SURGEON STRL SZ7.5 (GLOVE) ×1 IMPLANT
GLOVE BIO SURGEON STRL SZ8 (GLOVE) ×1 IMPLANT
GLOVE SS BIOGEL STRL SZ 7 (GLOVE) ×1 IMPLANT
GLOVE SS BIOGEL STRL SZ 7.5 (GLOVE) ×1 IMPLANT
GOWN STRL SURGICAL XL XLNG (GOWN DISPOSABLE) ×2 IMPLANT
INSERT HUMERAL MED 39/ +3 (Shoulder) IMPLANT
KIT BASIN OR (CUSTOM PROCEDURE TRAY) ×1 IMPLANT
KIT TURNOVER KIT A (KITS) IMPLANT
MANIFOLD NEPTUNE II (INSTRUMENTS) ×1 IMPLANT
NDL TAPERED W/ NITINOL LOOP (MISCELLANEOUS) ×1 IMPLANT
NEEDLE TAPERED W/ NITINOL LOOP (MISCELLANEOUS) ×1
NS IRRIG 1000ML POUR BTL (IV SOLUTION) ×1 IMPLANT
PACK SHOULDER (CUSTOM PROCEDURE TRAY) ×1 IMPLANT
PAD ARMBOARD 7.5X6 YLW CONV (MISCELLANEOUS) ×1 IMPLANT
PAD COLD SHLDR WRAP-ON (PAD) ×1 IMPLANT
PIN NITINOL TARGETER 2.8 (PIN) IMPLANT
PIN SET MODULAR GLENOID SYSTEM (PIN) IMPLANT
RESTRAINT HEAD UNIVERSAL NS (MISCELLANEOUS) ×1 IMPLANT
SCREW CENTRAL MOD 30MM (Screw) IMPLANT
SCREW PERI LOCK 5.5X24 (Screw) IMPLANT
SCREW PERIPHERAL 5.5X20 LOCK (Screw) IMPLANT
SCREW PERIPHERAL 5.5X28 LOCK (Screw) IMPLANT
SLING ARM FOAM STRAP LRG (SOFTGOODS) IMPLANT
SLING ARM FOAM STRAP MED (SOFTGOODS) IMPLANT
SPACER SHLD UNI REV 39 +6 (Shoulder) IMPLANT
STEM HUMERAL UNI REV SIZE 12 (Stem) IMPLANT
STRIP CLOSURE SKIN 1/2X4 (GAUZE/BANDAGES/DRESSINGS) ×1 IMPLANT
SUT MNCRL AB 3-0 PS2 18 (SUTURE) ×1 IMPLANT
SUT MON AB 2-0 CT1 36 (SUTURE) ×1 IMPLANT
SUT VIC AB 1 CT1 36 (SUTURE) ×1 IMPLANT
SUTURE TAPE 1.3 40 TPR END (SUTURE) ×2 IMPLANT
SUTURETAPE 1.3 40 TPR END (SUTURE) ×2
TOWEL GREEN STERILE FF (TOWEL DISPOSABLE) ×1 IMPLANT
TOWEL OR 17X26 10 PK STRL BLUE (TOWEL DISPOSABLE) ×1 IMPLANT
TUBE SUCTION HIGH CAP CLEAR NV (SUCTIONS) ×1 IMPLANT
TUBING CONNECTING 10 (TUBING) ×1 IMPLANT
WATER STERILE IRR 1000ML POUR (IV SOLUTION) ×2 IMPLANT

## 2023-05-19 NOTE — Op Note (Signed)
 05/19/2023  11:52 AM  PATIENT:   Jonathan Woodward  89 y.o. male  PRE-OPERATIVE DIAGNOSIS:  Right shoulder rotator cuff tear arthropathy  POST-OPERATIVE DIAGNOSIS: Same  PROCEDURE: Right shoulder reverse arthroplasty utilizing a press-fit size 12 Arthrex stem with a neutral metaphysis, +6 spacer, +3 polyethylene insert, 39/+4 glenosphere and a small/+2 baseplate  SURGEON:  Zailynn Brandel, Franky BATTLE M.D.  ASSISTANTS: Randine Ricks, PA-C  Randine Ricks, PA-C was utilized as an geophysicist/field seismologist throughout this case, essential for help with positioning the patient, positioning extremity, tissue manipulation, implantation of the prosthesis, suture management, wound closure, and intraoperative decision-making.  ANESTHESIA:   General Endotracheal and interscalene block with Exparel   EBL: 300 cc  SPECIMEN: None  Drains: None   PATIENT DISPOSITION:  PACU - hemodynamically stable.    PLAN OF CARE: Discharge to home after PACU  Brief history:  Patient is an 89 year old gentleman well-known to our practice with chronic and progressively increasing right shoulder pain related to severe rotator cuff tear arthropathy.  Due to his increasing functional limitations and failure to respond to prolonged attempts at conservative management, he is brought to the operating this time for planned right shoulder reverse arthroplasty.  Preoperatively, I counseled the patient regarding treatment options and risks versus benefits thereof.  Possible surgical complications were all reviewed including potential for bleeding, infection, neurovascular injury, persistent pain, loss of motion, anesthetic complication, failure of the implant, and possible need for additional surgery. They understand and accept and agrees with our planned procedure.   Procedure in detail:  After undergoing routine preop evaluation the patient received prophylactic antibiotics and interscalene block with Exparel  was established in the holding  area by the anesthesia department.  Subsequently placed spine on the operating table and underwent the smooth induction of a general endotracheal anesthesia.  Placed into the beachchair position and appropriately padded and protected.  The right shoulder girdle region was sterilely prepped and draped in standard fashion.  Timeout was called.  Deltopectoral approach the right shoulder was made through an approximately 10 cm incision.  Dissection carried deeply and electrocautery was used for hemostasis.  The deltopectoral interval was then developed from proximal to distal with the vein taken laterally we did identify some enlarged and unusual varicosities and confluence of veins which did require ligation to allow exposure and hemostasis.  The conjoined tendon was mobilized and retracted medially.  Adhesions were divided beneath the deltoid.  The long head biceps tendon was tenodesed at the upper border the pectoralis major tendon with the proximal segment unroofed and excised.  The subscapularis was then separated from the lesser tuberosity using electrocautery and superior subscap split to the base of the coracoid and the subscap was then tagged with a pair of grasping suture tape sutures and reflected medially.  Capsular attachments were then divided in a subperiosteal fashion from the anterior and inferior margins of the humeral neck allowing delivery of the humeral head through the wound.  An extra medullary guide was then used outline the proposed humeral head resection which we performed with an oscillating saw at approximately 20 degrees retroversion.  Marginal osteophytes were removed and a metal cap placed of the cut proximal humeral surface.  We then exposed the glenoid and identified difficulties with appropriate exposure and so we went ahead and resected an additional 2 mm of bone from the metaphysis and once again placed a metal cap over the cut proximal humeral surface and with this we then had  appropriate exposure of the glenoid.  A circumferential labral resection was then performed.  Guidepin then directed into the center of the glenoid and the glenoid was then prepared with central followed by the peripheral reamer to a stable subchondral bony bed in preparation completed with a drill and tapped for a 30 mm lag screw.  All debris was then carefully removed and irrigated free.  A baseplate was assembled and inserted with vancomycin  powder applied to the threads of the lag screw and excellent fixation was achieved.  The peripheral locking screws were all then placed using standard technique with excellent fixation.  A 39/+4 glenosphere was then impacted onto the baseplate and the central locking screw was placed.  Our attention was then returned to the proximal humerus where the canal was opened and we broached up to a size 12 at 20 degrees retroversion with excellent fixation.  A neutral metaphyseal reaming guide was then used.  The metaphysis.  A trial implant was placed and this showed good motion stability and soft tissue balance.  Trial was then removed.  A final implant was assembled.  The canal was irrigated cleaned and dried.  The final implant was then seated with excellent fixation.  We then performed a series of trial reductions and ultimately felt that a +9 off the implant gave is the best motion stability and soft tissue balance.  This point the trial was removed.  A +6 spacer was then applied to the implant and a +3 poly was then impacted.  Our final reduction was then performed which showed excellent motor stability and soft tissue balance all much to our satisfaction.  The joint was then copiously irrigated.  Final hemostasis was obtained.  We confirmed that there was good elasticity of the subscapularis and was then repaired back to the eyelets on the collar of the implant using the previously placed suture tape sutures.  The balance of vancomycin  powder was then sprayed liberally  throughout the deep soft tissue planes.  The deltopectoral interval was reapproximated with a series of figure-of-eight number Vicryl sutures.  2-0 Monocryl used to close the subcu layer and intracuticular 3-0 Monocryl used to close the skin followed by Steri-Strips and Aquacel dressing.  The right arm was placed into a sling.  The patient was awakened, extubated, and taken to the recovery in stable condition.  Franky CHRISTELLA Pointer MD   Contact # [phone]

## 2023-05-19 NOTE — Evaluation (Addendum)
 Occupational Therapy Evaluation Patient Details Name: Jonathan Woodward MRN: [id number] DOB: [birth year] Today's Date: 05/19/2023   History of Present Illness 27 M s/p TSA.  PHM includes: Afib, prior CVA.   Clinical Impression   Precautions per MD: Shoulder FF 0-60; ER 0-20; Abd 0-45. ROM for ADL purposes only; AROM elbow/wrist/hand; OK for lap slides and pendulums; Loosen sling from neck when arm is supported in sitting.  If sitting in controlled environment, ok to come out of sling to give neck a break. Please sleep in it to protect until follow up in office. OK to use operative arm for feeding, hygiene and ADLs.  Patient verbalized understanding, and all handouts issued.  Patient will need ADL support over the short term, but shoulder progress as block wears off.  ADL completed.  All questions answered and no further OT in the acute setting.  Recommend follow up as prescribed by MD.       If plan is discharge home, recommend the following: Assist for transportation;Assistance with cooking/housework;A little help with bathing/dressing/bathroom    Functional Status Assessment  Patient has had a recent decline in their functional status and demonstrates the ability to make significant improvements in function in a reasonable and predictable amount of time.  Equipment Recommendations  None recommended by OT    Recommendations for Other Services       Precautions / Restrictions Precautions Precautions: Shoulder Type of Shoulder Precautions: see documentation Shoulder Interventions: Shoulder sling/immobilizer Precaution Booklet Issued: Yes (comment) Precaution Comments: verbalized understanding Restrictions Weight Bearing Restrictions Per Provider Order: Yes RUE Weight Bearing Per Provider Order: Non weight bearing      Mobility Bed Mobility                    Transfers                          Balance Overall balance assessment: Mild deficits observed, not  formally tested                                         ADL either performed or assessed with clinical judgement   ADL                   Upper Body Dressing : Moderate assistance   Lower Body Dressing: Moderate assistance;With adaptive equipment   Toilet Transfer: Supervision/safety                   Vision Baseline Vision/History: 0 No visual deficits       Perception Perception: Not tested       Praxis Praxis: Not tested       Pertinent Vitals/Pain Pain Assessment Pain Assessment: No/denies pain     Extremity/Trunk Assessment Upper Extremity Assessment Upper Extremity Assessment: RUE deficits/detail RUE Deficits / Details: s/p TSA 2/6 RUE Sensation: WNL RUE Coordination: WNL   Lower Extremity Assessment Lower Extremity Assessment: Overall WFL for tasks assessed   Cervical / Trunk Assessment Cervical / Trunk Assessment: Normal   Communication Communication Communication: No apparent difficulties   Cognition Arousal: Alert Behavior During Therapy: WFL for tasks assessed/performed Overall Cognitive Status: Within Functional Limits for tasks assessed  Home Living Family/patient expects to be discharged to:: Private residence Living Arrangements: Alone Available Help at Discharge: Family;Available 24 hours/day Type of Home: House Home Access: Stairs to enter [employer] of Steps: 2 Entrance Stairs-Rails: Left Home Layout: One level     Bathroom Shower/Tub: Producer, Television/film/video: Standard     Home Equipment: Shower seat;Toilet riser;Rolling Walker (2 wheels)          Prior Functioning/Environment Prior Level of Function : Independent/Modified Independent;History of Falls (last six months);Driving                        OT Problem List: Impaired UE functional use      OT Treatment/Interventions:       OT Goals(Current goals can be found in the care plan section) Acute Rehab OT Goals Patient Stated Goal: Return home OT Goal Formulation: With patient Time For Goal Achievement: 05/27/23 Potential to Achieve Goals: Good  OT Frequency:      Co-evaluation              AM-PAC OT 6 Clicks Daily Activity     Outcome Measure Help from another person eating meals?: A Little Help from another person taking care of personal grooming?: A Little Help from another person toileting, which includes using toliet, bedpan, or urinal?: A Little Help from another person bathing (including washing, rinsing, drying)?: A Lot Help from another person to put on and taking off regular upper body clothing?: A Lot Help from another person to put on and taking off regular lower body clothing?: A Lot 6 Click Score: 15   End of Session Nurse Communication: Mobility status  Activity Tolerance: Patient tolerated treatment well Patient left: in chair;with call bell/phone within reach  OT Visit Diagnosis: Muscle weakness (generalized) (M62.81)                Time: 8579-8544 OT Time Calculation (min): 35 min Charges:  OT General Charges $OT Visit: 1 Visit OT Evaluation $OT Eval Moderate Complexity: 1 Mod OT Treatments $Self Care/Home Management : 8-22 mins  05/19/2023  RP, OTR/L  Acute Rehabilitation Services  Office:  [phone]   Charlie JONETTA Halsted 05/19/2023, 3:00 PM

## 2023-05-19 NOTE — Anesthesia Procedure Notes (Signed)
 Anesthesia Regional Block: Interscalene brachial plexus block   Pre-Anesthetic Checklist: , timeout performed,  Correct Patient, Correct Site, Correct Laterality,  Correct Procedure, Correct Position, site marked,  Risks and benefits discussed,  Surgical consent,  Pre-op evaluation,  At surgeon's request and post-op pain management  Laterality: Right  Prep: chloraprep       Needles:  Injection technique: Single-shot  Needle Type: Echogenic Stimulator Needle     Needle Length: 5cm  Needle Gauge: 22     Additional Needles:   Procedures:, nerve stimulator,,, ultrasound used (permanent image in chart),,     Nerve Stimulator or Paresthesia:  Response: hand, 0.45 mA  Additional Responses:   Narrative:  Start time: 05/19/2023 8:50 AM End time: 05/19/2023 8:59 AM Injection made incrementally with aspirations every 5 mL.  Performed by: Personally  Anesthesiologist: Steffanie Agent, MD  Additional Notes: Functioning IV was confirmed and monitors were applied.  A 50mm 22ga Arrow echogenic stimulator needle was used. Sterile prep and drape,hand hygiene and sterile gloves were used. Ultrasound guidance: relevant anatomy identified, needle position confirmed, local anesthetic spread visualized around nerve(s)., vascular puncture avoided.  Image printed for medical record. Negative aspiration and negative test dose prior to incremental administration of local anesthetic. The patient tolerated the procedure well.

## 2023-05-19 NOTE — Discharge Instructions (Addendum)

## 2023-05-19 NOTE — Anesthesia Postprocedure Evaluation (Signed)
 Anesthesia Post Note  Patient: Jonathan Woodward  Procedure(s) Performed: REVERSE SHOULDER ARTHROPLASTY (Right: Shoulder)     Patient location during evaluation: PACU Anesthesia Type: Regional and General Level of consciousness: awake and alert Pain management: pain level controlled Vital Signs Assessment: post-procedure vital signs reviewed and stable Respiratory status: spontaneous breathing, nonlabored ventilation, respiratory function stable and patient connected to nasal cannula oxygen Cardiovascular status: blood pressure returned to baseline and stable Postop Assessment: no apparent nausea or vomiting Anesthetic complications: no   No notable events documented.  Last Vitals:  Vitals:   05/19/23 1300 05/19/23 1315  BP: (!) 174/74 (!) 170/64  Pulse: (!) 49 (!) 49  Resp: 18 19  Temp:    SpO2: 92% 93%    Last Pain:  Vitals:   05/19/23 1315  TempSrc:   PainSc: 0-No pain                 Aurelie Dicenzo

## 2023-05-19 NOTE — Anesthesia Procedure Notes (Signed)
 Procedure Name: Intubation Date/Time: 05/19/2023 9:47 AM  Performed by: Harl Armida PARAS, CRNAPre-anesthesia Checklist: Patient identified, Emergency Drugs available, Suction available and Patient being monitored Patient Re-evaluated:Patient Re-evaluated prior to induction Oxygen Delivery Method: Circle system utilized Preoxygenation: Pre-oxygenation with 100% oxygen Induction Type: IV induction Ventilation: Mask ventilation without difficulty Laryngoscope Size: Mac, 3 and Glidescope Grade View: Grade I Tube type: Oral Tube size: 7.5 mm Airway Equipment and Method: Stylet and Video-laryngoscopy Placement Confirmation: ETT inserted through vocal cords under direct vision, positive ETCO2 and breath sounds checked- equal and bilateral Secured at: 22 cm Tube secured with: Tape Dental Injury: Teeth and Oropharynx as per pre-operative assessment  Comments: Intubated by Paramedic in rotation for glidescope intubations. Atraumatic, smooth intubation.

## 2023-05-19 NOTE — Anesthesia Preprocedure Evaluation (Addendum)
 Anesthesia Evaluation  Patient identified by MRN, date of birth, ID band Patient awake    Reviewed: Allergy & Precautions, H&P , NPO status , Patient's Chart, lab work & pertinent test results  Airway Mallampati: I   Neck ROM: full    Dental  (+) Edentulous Upper,    Pulmonary former smoker   breath sounds clear to auscultation       Cardiovascular hypertension, Pt. on medications and Pt. on home beta blockers + dysrhythmias Atrial Fibrillation  Rhythm:irregular Rate:Normal     Neuro/Psych CVA, No Residual Symptoms    GI/Hepatic   Endo/Other    Renal/GU      Musculoskeletal  (+) Arthritis ,    Abdominal   Peds  Hematology   Anesthesia Other Findings   Reproductive/Obstetrics                             Anesthesia Physical Anesthesia Plan  ASA: 4  Anesthesia Plan: General and Regional   Post-op Pain Management: Regional block*, Minimal or no pain anticipated, Tylenol  PO (pre-op)* and Celebrex  PO (pre-op)*   Induction: Intravenous  PONV Risk Score and Plan: 1 and Ondansetron , Treatment may vary due to age or medical condition and TIVA  Airway Management Planned: Oral ETT  Additional Equipment: None  Intra-op Plan:   Post-operative Plan: Extubation in OR  Informed Consent: I have reviewed the patients History and Physical, chart, labs and discussed the procedure including the risks, benefits and alternatives for the proposed anesthesia with the patient or authorized representative who has indicated his/her understanding and acceptance.     Dental advisory given  Plan Discussed with: CRNA, Anesthesiologist and Surgeon  Anesthesia Plan Comments: (See PAT note 06/22/22   DISCUSSION: Jonathan Woodward is an 89 yo male who presents to PAT prior to surgery above. PMH of former smoking, HTN, PAF on eliquis , diastolic dysfunction, hx of CVA (01/2023), RA.   Patient follows with  Cardiology for hx of A.fib and HFpEF.  He underwent DCCV in 12/2021. He takes Flecainide  and Metoprolol . Takes Eliquis  as well. Last seen on 04/29/23 and noted to be doing well. Cleared for surgery:   Preop CV eval: Low risk for major cardiovascular complications with planned shoulder surgery.  Okay to temporarily stop his anticoagulant.  Avoid interruptions in his metoprolol .   Patient had a CVA on 01/25/23. He presented to confusion and difficulty with fine motor skills. MRI showed acute right MCA territory infarct. CTA Head/Neck showed concern for acute occlusion of right distal MCA. Echocardiogram obtained to assess for possible origin of clot; this showed grade 1 diastolic dysfunction with LVEF 65 to 70% but no intracardiac source of embolism. Neurology recommended add ASA to Eliquis . He followed up with Neurology as an outpatient. They noted no residual deficits. They advised wait 3-6 months post CVA prior to surgery.   Patient follows with Rheumatology for RA. He is on methotrexate  and low dose prednisone  chronically.   EKG 04/29/23   Sinus bradycardia with 1st degree A-V block Right bundle branch block   CV:   Echo 01/27/23:   IMPRESSIONS      1. Left ventricular ejection fraction, by estimation, is 65 to 70%. The left ventricle has normal function. The left ventricle has no regional wall motion abnormalities. There is mild concentric left ventricular hypertrophy. Left ventricular diastolic parameters are consistent with Grade I diastolic dysfunction (impaired relaxation).  2. Right ventricular systolic function is normal. The right  ventricular size is mildly enlarged.  3. Left atrial size was mildly dilated.  4. The mitral valve is degenerative. Mild mitral valve regurgitation. No evidence of mitral stenosis.  5. The aortic valve is tricuspid. There is mild calcification of the aortic valve. Aortic valve regurgitation is mild. Aortic valve sclerosis is present, with no  evidence of aortic valve stenosis.   Comparison(s): No significant change from prior study.   Conclusion(s)/Recommendation(s): No intracardiac source of embolism detected on this transthoracic study. Consider a transesophageal echocardiogram to exclude cardiac source of embolism if clinically indicated.   Stress test 11/04/21      Findings are consistent with no prior ischemia. The study is low risk.   No ST deviation was noted.   Left ventricular function is abnormal. Global function is mildly reduced. Nuclear stress EF: 49 %. The left ventricular ejection fraction is mildly decreased (45-54%). End diastolic cavity size is normal.   Prior study not available for comparison.   Low risk stress nuclear study with normal perfusion. There is mild reduction in left ventricular systolic function, but this may be related to poor gating in the setting of atrial fibrillation.  )       Anesthesia Quick Evaluation

## 2023-05-19 NOTE — H&P (Signed)
 Jonathan Woodward    Chief Complaint: Right shoulder rotator cuff tear arthropathy HPI: The patient is a 89 y.o. male with chronic and progressive increasing right shoulder pain related to severe rotator cuff tear arthropathy.  Due to his increasing functional limitations and failure to respond to prolonged attempts of conservative management, he is brought to the operating room today for planned right shoulder reverse arthroplasty  Past Medical History:  Diagnosis Date   Arthritis    Rheumatoid Arthritis   Atrial fibrillation (HCC)    Dysrhythmia    Glaucoma    HTN (hypertension)    Stroke (HCC) 01/25/2023   no residule problems      Past Surgical History:  Procedure Laterality Date   CARDIOVERSION N/A 10/16/2021   Procedure: CARDIOVERSION;  Surgeon: Mona Vinie BROCKS, MD;  Location: [facility] ENDOSCOPY;  Service: Cardiovascular;  Laterality: N/A;   CARDIOVERSION N/A 12/29/2021   Procedure: CARDIOVERSION;  Surgeon: Loni Soyla LABOR, MD;  Location: [facility] ENDOSCOPY;  Service: Cardiovascular;  Laterality: N/A;   CATARACT EXTRACTION     TOTAL KNEE ARTHROPLASTY Left 03/03/2022   Procedure: TOTAL KNEE ARTHROPLASTY;  Surgeon: Edna Toribio LABOR, MD;  Location: [facility] ORS;  Service: Orthopedics;  Laterality: Left;   TOTAL KNEE ARTHROPLASTY Right 07/02/2022   Procedure: TOTAL KNEE ARTHROPLASTY;  Surgeon: Edna Toribio LABOR, MD;  Location: [facility] ORS;  Service: Orthopedics;  Laterality: Right;    No family history on file.  Social History:  reports that he quit smoking about 45 years ago. His smoking use included cigarettes, pipe, and cigars. He quit smokeless tobacco use about 45 years ago.  His smokeless tobacco use included chew and snuff. He reports that he does not drink alcohol  and does not use drugs.  BMI: Estimated body mass index is 30.12 kg/m as calculated from the following:   Height as of 05/11/23: 5' 6 (1.676 m).   Weight as of 05/11/23: 84.6 kg.  Lab Results  Component Value Date    ALBUMIN  3.8 05/11/2023   Diabetes: Patient does not have a diagnosis of diabetes. Lab Results  Component Value Date   HGBA1C 5.1 01/26/2023     Smoking Status:       No medications prior to admission.     Physical Exam: Right shoulder demonstrates painful and guarded motion as noted at his recent office visit.  He has globally decreased strength to manual muscle testing.  Examination otherwise noted at recent office visits.  Imaging studies confirm changes consistent with chronic rotator cuff tear arthropathy of the right shoulder.  Vitals     Assessment/Plan  Impression: Right shoulder rotator cuff tear arthropathy  Plan of Action: Procedure(s): REVERSE SHOULDER ARTHROPLASTY  Leinaala Catanese M Jolleen Seman 05/19/2023, 6:45 AM Contact # [phone]

## 2023-05-19 NOTE — Transfer of Care (Signed)
 Immediate Anesthesia Transfer of Care Note  Patient: Jonathan Woodward  Procedure(s) Performed: REVERSE SHOULDER ARTHROPLASTY (Right: Shoulder)  Patient Location: PACU  Anesthesia Type:General and Regional  Level of Consciousness: awake, alert , and oriented  Airway & Oxygen Therapy: Patient Spontanous Breathing and Patient connected to face mask oxygen  Post-op Assessment: Report given to RN and Post -op Vital signs reviewed and stable  Post vital signs: Reviewed and stable  Last Vitals:  Vitals Value Taken Time  BP 134/58 05/19/23 1210  Temp    Pulse 50 05/19/23 1213  Resp 21 05/19/23 1213  SpO2 96 % 05/19/23 1213  Vitals shown include unfiled device data.  Last Pain:  Vitals:   05/19/23 0945  TempSrc:   PainSc: 0-No pain      Patients Stated Pain Goal: 2 (05/19/23 0725)  Complications: No notable events documented.

## 2023-05-20 ENCOUNTER — Encounter (HOSPITAL_COMMUNITY): Payer: Self-pay | Admitting: Orthopedic Surgery

## 2023-06-01 DIAGNOSIS — Z4731 Aftercare following explantation of shoulder joint prosthesis: Secondary | ICD-10-CM | POA: Diagnosis not present

## 2023-06-01 DIAGNOSIS — Z96611 Presence of right artificial shoulder joint: Secondary | ICD-10-CM | POA: Diagnosis not present

## 2023-06-16 DIAGNOSIS — E669 Obesity, unspecified: Secondary | ICD-10-CM | POA: Diagnosis not present

## 2023-06-16 DIAGNOSIS — Z6830 Body mass index (BMI) 30.0-30.9, adult: Secondary | ICD-10-CM | POA: Diagnosis not present

## 2023-06-16 DIAGNOSIS — M353 Polymyalgia rheumatica: Secondary | ICD-10-CM | POA: Diagnosis not present

## 2023-06-16 DIAGNOSIS — M0579 Rheumatoid arthritis with rheumatoid factor of multiple sites without organ or systems involvement: Secondary | ICD-10-CM | POA: Diagnosis not present

## 2023-06-16 DIAGNOSIS — M170 Bilateral primary osteoarthritis of knee: Secondary | ICD-10-CM | POA: Diagnosis not present

## 2023-06-28 ENCOUNTER — Other Ambulatory Visit: Payer: Self-pay | Admitting: Cardiovascular Disease

## 2023-06-30 DIAGNOSIS — M25511 Pain in right shoulder: Secondary | ICD-10-CM | POA: Diagnosis not present

## 2023-07-02 ENCOUNTER — Emergency Department (HOSPITAL_COMMUNITY)
Admission: EM | Admit: 2023-07-02 | Discharge: 2023-07-03 | Disposition: A | Discharge status: Home / Self Care | Attending: Emergency Medicine | Admitting: Emergency Medicine

## 2023-07-02 DIAGNOSIS — Z8673 Personal history of transient ischemic attack (TIA), and cerebral infarction without residual deficits: Secondary | ICD-10-CM | POA: Insufficient documentation

## 2023-07-02 DIAGNOSIS — I10 Essential (primary) hypertension: Secondary | ICD-10-CM | POA: Diagnosis not present

## 2023-07-02 DIAGNOSIS — Z7982 Long term (current) use of aspirin: Secondary | ICD-10-CM | POA: Insufficient documentation

## 2023-07-02 DIAGNOSIS — M79642 Pain in left hand: Secondary | ICD-10-CM | POA: Insufficient documentation

## 2023-07-02 DIAGNOSIS — Z79899 Other long term (current) drug therapy: Secondary | ICD-10-CM | POA: Insufficient documentation

## 2023-07-02 DIAGNOSIS — R2232 Localized swelling, mass and lump, left upper limb: Secondary | ICD-10-CM | POA: Insufficient documentation

## 2023-07-02 DIAGNOSIS — M1812 Unilateral primary osteoarthritis of first carpometacarpal joint, left hand: Secondary | ICD-10-CM | POA: Diagnosis not present

## 2023-07-02 DIAGNOSIS — Z7901 Long term (current) use of anticoagulants: Secondary | ICD-10-CM | POA: Diagnosis not present

## 2023-07-03 ENCOUNTER — Emergency Department (HOSPITAL_COMMUNITY)

## 2023-07-03 ENCOUNTER — Encounter (HOSPITAL_COMMUNITY): Payer: Self-pay | Admitting: Emergency Medicine

## 2023-07-03 ENCOUNTER — Other Ambulatory Visit: Payer: Self-pay

## 2023-07-03 DIAGNOSIS — M1812 Unilateral primary osteoarthritis of first carpometacarpal joint, left hand: Secondary | ICD-10-CM | POA: Diagnosis not present

## 2023-07-03 DIAGNOSIS — M79642 Pain in left hand: Secondary | ICD-10-CM | POA: Diagnosis not present

## 2023-07-03 LAB — BASIC METABOLIC PANEL
Anion gap: 10 (ref 5–15)
BUN: 29 mg/dL — ABNORMAL HIGH (ref 8–23)
CO2: 23 mmol/L (ref 22–32)
Calcium: 9.6 mg/dL (ref 8.9–10.3)
Chloride: 100 mmol/L (ref 98–111)
Creatinine, Ser: 1.45 mg/dL — ABNORMAL HIGH (ref 0.61–1.24)
GFR, Estimated: 46 mL/min — ABNORMAL LOW (ref >60)
Glucose, Bld: 128 mg/dL — ABNORMAL HIGH (ref 70–99)
Potassium: 4.1 mmol/L (ref 3.5–5.1)
Sodium: 133 mmol/L — ABNORMAL LOW (ref 135–145)

## 2023-07-03 LAB — CBC
HCT: 32.3 % — ABNORMAL LOW (ref 39.0–52.0)
Hemoglobin: 10.6 g/dL — ABNORMAL LOW (ref 13.0–17.0)
MCH: 32.1 pg (ref 26.0–34.0)
MCHC: 32.8 g/dL (ref 30.0–36.0)
MCV: 97.9 fL (ref 80.0–100.0)
Platelets: 185 10*3/uL (ref 150–400)
RBC: 3.3 MIL/uL — ABNORMAL LOW (ref 4.22–5.81)
RDW: 13.7 % (ref 11.5–15.5)
WBC: 9.1 10*3/uL (ref 4.0–10.5)
nRBC: 0 % (ref 0.0–0.2)

## 2023-07-03 MED ORDER — HYDROCODONE-ACETAMINOPHEN 5-325 MG PO TABS
1.0000 | ORAL_TABLET | Freq: Once | ORAL | Status: AC
  Administered 2023-07-03: 1 via ORAL
  Filled 2023-07-03: qty 1

## 2023-07-03 MED ORDER — AMOXICILLIN-POT CLAVULANATE 875-125 MG PO TABS
1.0000 | ORAL_TABLET | Freq: Once | ORAL | Status: AC
  Administered 2023-07-03: 1 via ORAL
  Filled 2023-07-03: qty 1

## 2023-07-03 MED ORDER — HYDROCODONE-ACETAMINOPHEN 5-325 MG PO TABS: 1.0000 | ORAL_TABLET | Freq: Four times a day (QID) | ORAL | 0 refills | Status: AC | PRN

## 2023-07-03 MED ORDER — DOXYCYCLINE HYCLATE 100 MG PO TABS
100.0000 mg | ORAL_TABLET | Freq: Once | ORAL | Status: AC
  Administered 2023-07-03: 100 mg via ORAL
  Filled 2023-07-03: qty 1

## 2023-07-03 MED ORDER — OXYCODONE-ACETAMINOPHEN 5-325 MG PO TABS
1.0000 | ORAL_TABLET | ORAL | Status: DC | PRN
  Administered 2023-07-03: 1 via ORAL
  Filled 2023-07-03: qty 1

## 2023-07-03 MED ORDER — DOXYCYCLINE HYCLATE 100 MG PO CAPS: 100.0000 mg | ORAL_CAPSULE | Freq: Two times a day (BID) | ORAL | 0 refills | Status: AC

## 2023-07-03 MED ORDER — AMOXICILLIN-POT CLAVULANATE 875-125 MG PO TABS: 1.0000 | ORAL_TABLET | Freq: Two times a day (BID) | ORAL | 0 refills | Status: DC

## 2023-07-03 NOTE — Discharge Instructions (Signed)
 Keep your hand elevated is much as possible.  You can place an ice pack on it frequently. If there is any rapid worsening of pain, swelling or redness of the next 48 hours please return to the ER.  If it does not get worse, you can have a recheck with your orthopedic doctor in the next week Be sure to take each antibiotic twice a day for a week

## 2023-07-03 NOTE — ED Provider Notes (Signed)
 [place] EMERGENCY DEPARTMENT AT [facility] Provider Note   CSN: [id number] Arrival date & time: 07/02/23  2336     History  Chief Complaint  Patient presents with   Hand Pain   Hand Swelling    Jonathan Woodward is a 89 y.o. male.  The history is provided by the patient and a relative.   Patient w/history of arthritis, atrial fibrillation presents with left hand pain and swelling Denies any known injury.  This has been ongoing for the past day. Reports most of the pain is around the left index finger with swelling and difficulty with movement.  No previous history of gout.  Patient is very active and does use his hands a lot so he may have injured it unknowingly  No fevers or chills.  No vomiting Past Medical History:  Diagnosis Date   Arthritis    Rheumatoid Arthritis   Atrial fibrillation (HCC)    Dysrhythmia    Glaucoma    HTN (hypertension)    Stroke (HCC) 01/25/2023   no residule problems    Home Medications Prior to Admission medications   Medication Sig Start Date End Date Taking? Authorizing Provider  amoxicillin-clavulanate (AUGMENTIN) 875-125 MG tablet Take 1 tablet by mouth every 12 (twelve) hours. 07/03/23  Yes Zadie Rhine, MD  doxycycline (VIBRAMYCIN) 100 MG capsule Take 1 capsule (100 mg total) by mouth 2 (two) times daily. One po bid x 7 days 07/03/23  Yes Zadie Rhine, MD  HYDROcodone-acetaminophen (NORCO/VICODIN) 5-325 MG tablet Take 1 tablet by mouth every 6 (six) hours as needed. 07/03/23  Yes Zadie Rhine, MD  acetaminophen (TYLENOL) 325 MG tablet Take 2 tablets (650 mg total) by mouth every 6 (six) hours as needed for mild pain (pain score 1-3) (or Fever >/= 101). 01/27/23   Dameron, Nolberto Hanlon, DO  amLODipine (NORVASC) 10 MG tablet Take 10 mg by mouth in the morning. 02/07/23   [provider]  apixaban (ELIQUIS) 5 MG TABS tablet Take 1 tablet (5 mg total) by mouth 2 (two) times daily. 07/05/22   Cecil Cobbs,  PA-C  aspirin EC 81 MG tablet Take 1 tablet (81 mg total) by mouth daily. Swallow whole. 01/28/23   Dameron, Nolberto Hanlon, DO  cyanocobalamin (VITAMIN B12) 1000 MCG tablet Take 1,000 mcg by mouth in the morning.    [provider]  docusate sodium (COLACE) 50 MG capsule Take 50 mg by mouth daily as needed for mild constipation.    [provider]  Flaxseed, Linseed, (FLAXSEED OIL) 1200 MG CAPS Take 1,200 mg by mouth in the morning.    [provider]  flecainide (TAMBOCOR) 50 MG tablet Take 1 tablet (50 mg total) by mouth 2 (two) times daily. 06/30/23   Croitoru, Mihai, MD  folic acid (FOLVITE) 1 MG tablet Take 1 mg by mouth in the morning.    [provider]  methotrexate (RHEUMATREX) 2.5 MG tablet Take 12.5 mg by mouth every Thursday. 07/05/22   [provider]  metoprolol succinate (TOPROL-XL) 50 MG 24 hr tablet Take 1 tablet (50 mg total) by mouth daily. Take with or immediately following a meal. 01/28/23   Dameron, Nolberto Hanlon, DO  Omega-3 Fatty Acids (FISH OIL) 1000 MG CAPS Take 1,000 mg by mouth in the morning.    [provider]  ondansetron (ZOFRAN) 4 MG tablet Take 1 tablet (4 mg total) by mouth every 8 (eight) hours as needed for nausea or vomiting. 05/19/23   Shuford, French Ana, PA-C  oxyCODONE-acetaminophen (PERCOCET) 5-325 MG tablet Take 1 tablet by mouth every 4 (four) hours as needed (max 6 q). 05/19/23   Shuford, French Ana, PA-C  predniSONE (DELTASONE) 5 MG tablet Take 5 mg by mouth daily with breakfast.    [provider]  rosuvastatin (CRESTOR) 20 MG tablet Take 1 tablet (20 mg total) by mouth daily. 01/28/23   Dameron, Nolberto Hanlon, DO  sennosides-docusate sodium (SENOKOT-S) 8.6-50 MG tablet Take 1 tablet by mouth daily as needed for constipation.    [provider]  tiZANidine (ZANAFLEX) 4 MG tablet Take 1 tablet (4 mg total) by mouth every 6 (six) hours as needed for muscle spasms. 05/19/23 05/18/24  Shuford, French Ana, PA-C   valsartan-hydrochlorothiazide (DIOVAN-HCT) 320-25 MG tablet Take 1 tablet by mouth in the morning.    [provider]      Allergies    Patient has no known allergies.    Review of Systems   Review of Systems  Constitutional:  Negative for chills and fever.  Gastrointestinal:  Negative for vomiting.  Musculoskeletal:  Positive for arthralgias and joint swelling.    Physical Exam Updated Vital Signs BP (!) 185/73 (BP Location: Right Arm)   Pulse 73   Temp 97.9 F (36.6 C) (Oral)   Resp 20   Wt 84.6 kg   SpO2 99%   BMI 30.10 kg/m  Physical Exam CONSTITUTIONAL: Elderly, no acute distress HEAD: Normocephalic/atraumatic NEURO: Pt is awake/alert/appropriate, moves all extremitiesx4.  No facial droop.   EXTREMITIES: pulses normal/equal, full ROM Distal pulses intact of the left upper extremity.  Left hand is warm to touch.  There is a small abrasion on the left index finger.  There is diffuse swelling but his finger and hand are soft to touch.  No crepitus or fluctuance. No significant tenderness noted with flexing or extending of the finger He does have tenderness with palpation of the left hand and finger There is no streaking up the arm SKIN: warm, see photo PSYCH: no abnormalities of mood noted, alert and oriented to situation       ED Results / Procedures / Treatments   Labs (all labs ordered are listed, but only abnormal results are displayed) Labs Reviewed  BASIC METABOLIC PANEL - Abnormal; Notable for the following components:      Result Value   Sodium 133 (*)    Glucose, Bld 128 (*)    BUN 29 (*)    Creatinine, Ser 1.45 (*)    GFR, Estimated 46 (*)    All other components within normal limits  CBC - Abnormal; Notable for the following components:   RBC 3.30 (*)    Hemoglobin 10.6 (*)    HCT 32.3 (*)    All other components within normal limits    EKG None  Radiology DG Hand Complete Left Result Date: 07/03/2023 CLINICAL DATA:  Left hand  pain, no known injury, initial encounter EXAM: LEFT HAND - COMPLETE 3+ VIEW COMPARISON:  None Available. FINDINGS: Degenerative changes of the first [facility] joint are noted. Interphalangeal degenerative changes are noted as well. No acute fracture or dislocation is noted. No soft tissue changes are seen. Mild degenerative changes in the triscaphe joint are noted as well. IMPRESSION: Degenerative change without acute abnormality. Electronically Signed   By: Alcide Clever M.D.   On: 07/03/2023 00:46    Procedures Procedures    Medications Ordered in ED Medications  oxyCODONE-acetaminophen (PERCOCET/ROXICET) 5-325 MG per tablet 1 tablet (1 tablet Oral Given 07/03/23 0016)  doxycycline (  VIBRA-TABS) tablet 100 mg (has no administration in time range)  amoxicillin-clavulanate (AUGMENTIN) 875-125 MG per tablet 1 tablet (has no administration in time range)  HYDROcodone-acetaminophen (NORCO/VICODIN) 5-325 MG per tablet 1 tablet (has no administration in time range)    ED Course/ Medical Decision Making/ A&P                                 Medical Decision Making Amount and/or Complexity of Data Reviewed Labs: ordered. Radiology: ordered.  Risk Prescription drug management.   Patient presents with left hand pain and swelling and most of that is around his left index finger.  No known trauma but he does have a small wound to that finger Favor infectious etiology rather than gout.  Patient is not septic appearing, overall well-appearing.  Low suspicion for flexor tenosynovitis or other condition that would require operative management  Plan will be to have him elevate, ice it, dual antibiotic therapy and pain medicine. If there is any worsening including pain redness or swelling in the next 48 hours he is to return to the ER.  He can follow-up with his own orthopedist next week for recheck if there is no significant worsening         Final Clinical Impression(s) / ED Diagnoses Final  diagnoses:  Left hand pain    Rx / DC Orders ED Discharge Orders          Ordered    amoxicillin-clavulanate (AUGMENTIN) 875-125 MG tablet  Every 12 hours        07/03/23 0358    doxycycline (VIBRAMYCIN) 100 MG capsule  2 times daily        07/03/23 0358    HYDROcodone-acetaminophen (NORCO/VICODIN) 5-325 MG tablet  Every 6 hours PRN        07/03/23 0405              Zadie Rhine, MD 07/03/23 [phone]

## 2023-07-03 NOTE — ED Triage Notes (Addendum)
 Pt in with L hand pain and swelling that began today. L index knuckle is red, swollen and bruised. Pt denies any known injuries, take Eliquis, does have a scab to distal index finger. Limited ROM of hand, swelling and pain up to wrist. Pt also states he had L shoulder replacement on 2/6

## 2023-07-07 DIAGNOSIS — M25511 Pain in right shoulder: Secondary | ICD-10-CM | POA: Diagnosis not present

## 2023-07-12 ENCOUNTER — Other Ambulatory Visit: Payer: Self-pay | Admitting: Cardiovascular Disease

## 2023-07-13 DIAGNOSIS — M25511 Pain in right shoulder: Secondary | ICD-10-CM | POA: Diagnosis not present

## 2023-07-18 DIAGNOSIS — M353 Polymyalgia rheumatica: Secondary | ICD-10-CM | POA: Diagnosis not present

## 2023-07-18 DIAGNOSIS — M170 Bilateral primary osteoarthritis of knee: Secondary | ICD-10-CM | POA: Diagnosis not present

## 2023-07-18 DIAGNOSIS — M79642 Pain in left hand: Secondary | ICD-10-CM | POA: Diagnosis not present

## 2023-07-18 DIAGNOSIS — E669 Obesity, unspecified: Secondary | ICD-10-CM | POA: Diagnosis not present

## 2023-07-18 DIAGNOSIS — M0579 Rheumatoid arthritis with rheumatoid factor of multiple sites without organ or systems involvement: Secondary | ICD-10-CM | POA: Diagnosis not present

## 2023-07-18 DIAGNOSIS — Z6830 Body mass index (BMI) 30.0-30.9, adult: Secondary | ICD-10-CM | POA: Diagnosis not present

## 2023-07-18 DIAGNOSIS — Z79899 Other long term (current) drug therapy: Secondary | ICD-10-CM | POA: Diagnosis not present

## 2023-07-20 DIAGNOSIS — M25511 Pain in right shoulder: Secondary | ICD-10-CM | POA: Diagnosis not present

## 2023-08-03 DIAGNOSIS — M25511 Pain in right shoulder: Secondary | ICD-10-CM | POA: Diagnosis not present

## 2023-08-10 DIAGNOSIS — M25511 Pain in right shoulder: Secondary | ICD-10-CM | POA: Diagnosis not present

## 2023-08-10 DIAGNOSIS — R5381 Other malaise: Secondary | ICD-10-CM | POA: Diagnosis not present

## 2023-08-10 DIAGNOSIS — J101 Influenza due to other identified influenza virus with other respiratory manifestations: Secondary | ICD-10-CM | POA: Diagnosis not present

## 2023-08-10 DIAGNOSIS — R5383 Other fatigue: Secondary | ICD-10-CM | POA: Diagnosis not present

## 2023-08-16 DIAGNOSIS — M25511 Pain in right shoulder: Secondary | ICD-10-CM | POA: Diagnosis not present

## 2023-08-19 DIAGNOSIS — J101 Influenza due to other identified influenza virus with other respiratory manifestations: Secondary | ICD-10-CM | POA: Diagnosis not present

## 2023-08-19 DIAGNOSIS — R5383 Other fatigue: Secondary | ICD-10-CM | POA: Diagnosis not present

## 2023-08-19 DIAGNOSIS — Z139 Encounter for screening, unspecified: Secondary | ICD-10-CM | POA: Diagnosis not present

## 2023-08-19 DIAGNOSIS — Z1331 Encounter for screening for depression: Secondary | ICD-10-CM | POA: Diagnosis not present

## 2023-08-19 DIAGNOSIS — M069 Rheumatoid arthritis, unspecified: Secondary | ICD-10-CM | POA: Diagnosis not present

## 2023-08-19 DIAGNOSIS — D649 Anemia, unspecified: Secondary | ICD-10-CM | POA: Diagnosis not present

## 2023-08-23 DIAGNOSIS — M25511 Pain in right shoulder: Secondary | ICD-10-CM | POA: Diagnosis not present

## 2023-08-24 DIAGNOSIS — Z5189 Encounter for other specified aftercare: Secondary | ICD-10-CM | POA: Diagnosis not present

## 2023-08-24 DIAGNOSIS — Z96611 Presence of right artificial shoulder joint: Secondary | ICD-10-CM | POA: Diagnosis not present

## 2023-08-25 DIAGNOSIS — M25511 Pain in right shoulder: Secondary | ICD-10-CM | POA: Diagnosis not present

## 2023-08-30 DIAGNOSIS — M25511 Pain in right shoulder: Secondary | ICD-10-CM | POA: Diagnosis not present

## 2023-09-06 DIAGNOSIS — M25511 Pain in right shoulder: Secondary | ICD-10-CM | POA: Diagnosis not present

## 2023-09-14 DIAGNOSIS — E669 Obesity, unspecified: Secondary | ICD-10-CM | POA: Diagnosis not present

## 2023-09-14 DIAGNOSIS — M353 Polymyalgia rheumatica: Secondary | ICD-10-CM | POA: Diagnosis not present

## 2023-09-14 DIAGNOSIS — Z6830 Body mass index (BMI) 30.0-30.9, adult: Secondary | ICD-10-CM | POA: Diagnosis not present

## 2023-09-14 DIAGNOSIS — W57XXXA Bitten or stung by nonvenomous insect and other nonvenomous arthropods, initial encounter: Secondary | ICD-10-CM | POA: Diagnosis not present

## 2023-09-14 DIAGNOSIS — M79642 Pain in left hand: Secondary | ICD-10-CM | POA: Diagnosis not present

## 2023-09-14 DIAGNOSIS — M170 Bilateral primary osteoarthritis of knee: Secondary | ICD-10-CM | POA: Diagnosis not present

## 2023-09-14 DIAGNOSIS — M0579 Rheumatoid arthritis with rheumatoid factor of multiple sites without organ or systems involvement: Secondary | ICD-10-CM | POA: Diagnosis not present

## 2023-09-14 DIAGNOSIS — Z79899 Other long term (current) drug therapy: Secondary | ICD-10-CM | POA: Diagnosis not present

## 2023-09-15 DIAGNOSIS — M25511 Pain in right shoulder: Secondary | ICD-10-CM | POA: Diagnosis not present

## 2023-09-16 DIAGNOSIS — K219 Gastro-esophageal reflux disease without esophagitis: Secondary | ICD-10-CM | POA: Diagnosis not present

## 2023-09-23 ENCOUNTER — Encounter: Payer: Self-pay | Admitting: Cardiovascular Disease

## 2023-09-27 DIAGNOSIS — E871 Hypo-osmolality and hyponatremia: Secondary | ICD-10-CM | POA: Diagnosis not present

## 2023-11-23 DIAGNOSIS — I10 Essential (primary) hypertension: Secondary | ICD-10-CM | POA: Diagnosis not present

## 2023-11-23 DIAGNOSIS — H9193 Unspecified hearing loss, bilateral: Secondary | ICD-10-CM | POA: Diagnosis not present

## 2023-11-23 DIAGNOSIS — I4891 Unspecified atrial fibrillation: Secondary | ICD-10-CM | POA: Diagnosis not present

## 2023-11-30 DIAGNOSIS — Z79899 Other long term (current) drug therapy: Secondary | ICD-10-CM | POA: Diagnosis not present

## 2023-11-30 DIAGNOSIS — E7800 Pure hypercholesterolemia, unspecified: Secondary | ICD-10-CM | POA: Diagnosis not present

## 2023-11-30 DIAGNOSIS — M1120 Other chondrocalcinosis, unspecified site: Secondary | ICD-10-CM | POA: Diagnosis not present

## 2023-11-30 DIAGNOSIS — I4891 Unspecified atrial fibrillation: Secondary | ICD-10-CM | POA: Diagnosis not present

## 2023-11-30 DIAGNOSIS — M069 Rheumatoid arthritis, unspecified: Secondary | ICD-10-CM | POA: Diagnosis not present

## 2023-11-30 DIAGNOSIS — Z8673 Personal history of transient ischemic attack (TIA), and cerebral infarction without residual deficits: Secondary | ICD-10-CM | POA: Diagnosis not present

## 2023-11-30 DIAGNOSIS — I10 Essential (primary) hypertension: Secondary | ICD-10-CM | POA: Diagnosis not present

## 2024-01-04 ENCOUNTER — Encounter: Payer: Self-pay | Admitting: Cardiovascular Disease

## 2024-01-04 DIAGNOSIS — M79642 Pain in left hand: Secondary | ICD-10-CM | POA: Diagnosis not present

## 2024-01-04 DIAGNOSIS — M069 Rheumatoid arthritis, unspecified: Secondary | ICD-10-CM | POA: Diagnosis not present

## 2024-01-04 DIAGNOSIS — D649 Anemia, unspecified: Secondary | ICD-10-CM | POA: Diagnosis not present

## 2024-01-04 DIAGNOSIS — M79641 Pain in right hand: Secondary | ICD-10-CM | POA: Diagnosis not present

## 2024-01-04 DIAGNOSIS — E871 Hypo-osmolality and hyponatremia: Secondary | ICD-10-CM | POA: Diagnosis not present

## 2024-01-10 ENCOUNTER — Ambulatory Visit: Attending: Cardiovascular Disease | Admitting: Cardiovascular Disease

## 2024-01-10 ENCOUNTER — Encounter: Payer: Self-pay | Admitting: Cardiovascular Disease

## 2024-01-10 VITALS — BP 124/50 | HR 62 | Ht 66.0 in | Wt 191.0 lb

## 2024-01-10 DIAGNOSIS — Z79899 Other long term (current) drug therapy: Secondary | ICD-10-CM

## 2024-01-10 DIAGNOSIS — D6869 Other thrombophilia: Secondary | ICD-10-CM | POA: Diagnosis not present

## 2024-01-10 DIAGNOSIS — I4819 Other persistent atrial fibrillation: Secondary | ICD-10-CM | POA: Diagnosis not present

## 2024-01-10 DIAGNOSIS — Z5181 Encounter for therapeutic drug level monitoring: Secondary | ICD-10-CM | POA: Diagnosis not present

## 2024-01-10 DIAGNOSIS — I10 Essential (primary) hypertension: Secondary | ICD-10-CM | POA: Diagnosis not present

## 2024-01-10 DIAGNOSIS — I519 Heart disease, unspecified: Secondary | ICD-10-CM

## 2024-01-10 DIAGNOSIS — M0579 Rheumatoid arthritis with rheumatoid factor of multiple sites without organ or systems involvement: Secondary | ICD-10-CM | POA: Diagnosis not present

## 2024-01-10 MED ORDER — AMLODIPINE BESYLATE 5 MG PO TABS: 5.0000 mg | ORAL_TABLET | Freq: Every morning | ORAL | 3 refills | Status: AC

## 2024-01-10 NOTE — Progress Notes (Signed)
 Cardiology Office Note:    Date:  01/10/2024   ID:  Jonathan Woodward, DOB January 06, 1933, MRN [id number]  PCP:  Montey Lot, PA-C   [facility] Providers Cardiologist:  Jerel Balding, MD     Referring MD: Montey Lot, PA-C   No chief complaint on file.   History of Present Illness:    Jonathan Woodward is a 89 y.o. male with a hx of persistent atrial fibrillation status post successful cardioversion, rheumatoid arthritis of multiple joints, mild hypertension, knee osteoarthritis, history of carpal tunnel syndrome, who is planning left shoulder surgery with Dr. Melita.  From a cardiac point of view he continues to do very well.  He has not had any recurrence of atrial fibrillation since the second cardioversion performed in September 2023 (first attempt at cardioversion July was followed by early recurrence, subsequently has maintained normal rhythm on flecainide ).  He has undergone sequential bilateral knee surgery in the last couple of years, without any cardiac complications.  He continues to be very physically active for his age and has not had issues with palpitations, dizziness, syncope, chest pain or shortness of breath at rest or with activity, lower extremity edema, orthopnea, PND or intermittent claudication.  He has not had any falls or bleeding while on Eliquis .  His blood pressure has been a little low (especially his diastolic) and his dose of metoprolol  was recently cut in half.  Today in the clinic his diastolic blood pressure remains low (124/50).  He does wonder whether this is the reason why he has a little less energy than usual, but again denies dizziness.  He has had a flare of inflammation in multiple joints including the MCPs and PIPs of both hands but also elbows and knees, seem to improve after a shot of steroids.  He has seropositive rheumatoid arthritis and was previously doing well on methotrexate ; he has an appointment with Dr. Mai next  month.  He does have mild anemia and was told that he is iron deficient.  He has not had any obvious hematemesis or melena.  He also takes a folic acid  supplement on treatment with methotrexate .  His echocardiogram has shown mild RV enlargement and mildly reduced right ventricular function, with normal left ventricular systolic function.  The left atrium is severely dilated and the right atrium is moderately dilated.  When he was in atrial fibrillation we estimated his systolic PA pressure to be around 47 mmHg, but we were unable to estimate his PA pressure on a follow-up echo in sinus rhythm.  He had a normal nuclear stress test in July 2023.  He is unaware of the arrhythmia.  He is remarkably physically active for his age.  He does a lot of yard work, cutting down trees and clearing limbs.  He lives independently with his 88 year old significant other on a large property.  He had COVID-19 infection about 2 years ago in 2022 and has noticed that he has mild exertional dyspnea (NYHA functional class II) since then.   Past Medical History:  Diagnosis Date   Arthritis    Rheumatoid Arthritis   Atrial fibrillation (HCC)    Dysrhythmia    Glaucoma    HTN (hypertension)    Stroke (HCC) 01/25/2023   no residule problems    Past Surgical History:  Procedure Laterality Date   CARDIOVERSION N/A 10/16/2021   Procedure: CARDIOVERSION;  Surgeon: Mona Vinie BROCKS, MD;  Location[facility] ENDOSCOPY;  Service: Cardiovascular;  Laterality: N/A;   CARDIOVERSION N/A 12/29/2021  Procedure: CARDIOVERSION;  Surgeon: Loni Soyla LABOR, MD;  Location: [facility] ENDOSCOPY;  Service: Cardiovascular;  Laterality: N/A;   CATARACT EXTRACTION     REVERSE SHOULDER ARTHROPLASTY Right 05/19/2023   Procedure: REVERSE SHOULDER ARTHROPLASTY;  Surgeon: Melita Drivers, MD;  Location: [facility] ORS;  Service: Orthopedics;  Laterality: Right;    TOTAL KNEE ARTHROPLASTY Left 03/03/2022   Procedure: TOTAL KNEE ARTHROPLASTY;  Surgeon:  Edna Toribio LABOR, MD;  Location: [facility] ORS;  Service: Orthopedics;  Laterality: Left;   TOTAL KNEE ARTHROPLASTY Right 07/02/2022   Procedure: TOTAL KNEE ARTHROPLASTY;  Surgeon: Edna Toribio LABOR, MD;  Location: [facility] ORS;  Service: Orthopedics;  Laterality: Right;    Current Medications: Current Meds  Medication Sig   acetaminophen  (TYLENOL ) 325 MG tablet Take 2 tablets (650 mg total) by mouth every 6 (six) hours as needed for mild pain (pain score 1-3) (or Fever >/= 101).   apixaban  (ELIQUIS ) 5 MG TABS tablet Take 1 tablet (5 mg total) by mouth 2 (two) times daily.   aspirin  EC 81 MG tablet Take 1 tablet (81 mg total) by mouth daily. Swallow whole.   colchicine 0.6 MG tablet TAKE ONE TABLET BY MOUTH TWICE DAILY AS NEEDED FOR GOUT FLARES 30   cyanocobalamin  (VITAMIN B12) 1000 MCG tablet Take 1,000 mcg by mouth in the morning.   docusate sodium  (COLACE) 50 MG capsule Take 50 mg by mouth daily as needed for mild constipation.   Ferrous Sulfate (IRON PO) Take by mouth.   Flaxseed, Linseed, (FLAXSEED OIL) 1200 MG CAPS Take 1,200 mg by mouth in the morning.   flecainide  (TAMBOCOR ) 50 MG tablet Take 1 tablet (50 mg total) by mouth 2 (two) times daily.   folic acid  (FOLVITE ) 1 MG tablet Take 1 mg by mouth in the morning.   methotrexate  (RHEUMATREX) 2.5 MG tablet Take 12.5 mg by mouth every Thursday.   metoprolol  succinate (TOPROL -XL) 50 MG 24 hr tablet Take 1 tablet (50 mg total) by mouth daily. Take with or immediately following a meal.   Omega-3 Fatty Acids (FISH OIL ) 1000 MG CAPS Take 1,000 mg by mouth in the morning.   predniSONE  (DELTASONE ) 5 MG tablet Take 5 mg by mouth daily with breakfast.   rosuvastatin  (CRESTOR ) 20 MG tablet Take 1 tablet (20 mg total) by mouth daily.   sennosides-docusate sodium  (SENOKOT-S) 8.6-50 MG tablet Take 1 tablet by mouth daily as needed for constipation.   valsartan-hydrochlorothiazide (DIOVAN-HCT) 320-25 MG tablet Take 1 tablet by mouth in the morning.    [DISCONTINUED] amLODipine  (NORVASC ) 10 MG tablet Take 10 mg by mouth in the morning.     Allergies:   Patient has no known allergies.   Social History   Socioeconomic History   Marital status: Single    Spouse name: Not on file   Number of children: Not on file   Years of education: Not on file   Highest education level: Not on file  Occupational History   Not on file  Tobacco Use   Smoking status: Former    Current packs/day: 0.00    Types: Cigarettes, Pipe, Cigars    Quit date: 77    Years since quitting: 45.7   Smokeless tobacco: Former    Types: Chew, Snuff    Quit date: 1980  Vaping Use   Vaping status: Never Used  Substance and Sexual Activity   Alcohol  use: Never   Drug use: Never   Sexual activity: Not Currently  Other Topics Concern   Not on  file  Social History Narrative   Not on file   Social Drivers of Health   Financial Resource Strain: Not on file  Food Insecurity: No Food Insecurity (01/26/2023)   Hunger Vital Sign    Worried About Running Out of Food in the Last Year: Never true    Ran Out of Food in the Last Year: Never true  Transportation Needs: No Transportation Needs (01/26/2023)   PRAPARE - Administrator, Civil Service (Medical): No    Lack of Transportation (Non-Medical): No  Physical Activity: Not on file  Stress: Not on file  Social Connections: Not on file     Family History: The patient's family history is significantly negative for cardiac illness or stroke  ROS:   Please see the history of present illness.     All other systems reviewed and are negative.  EKGs/Labs/Other Studies Reviewed:    The following studies were reviewed today:   Echocardiogram 10/09/2021    1. Left ventricular ejection fraction, by estimation, is 65 to 70%. The  left ventricle has normal function. The left ventricle has no regional  wall motion abnormalities. There is mild left ventricular hypertrophy.  Left ventricular diastolic  parameters  are indeterminate. Elevated left ventricular end-diastolic pressure. The  E/e' is 19.   2. Right ventricular systolic function is mildly reduced. The right  ventricular size is mildly enlarged. There is moderately elevated  pulmonary artery systolic pressure. The estimated right ventricular  systolic pressure is 46.7 mmHg.   3. Left atrial size was severely dilated.   4. Right atrial size was moderately dilated.   5. The mitral valve is grossly normal. Trivial mitral valve  regurgitation. No evidence of mitral stenosis.   6. Tricuspid valve regurgitation is mild to moderate.   7. The aortic valve is grossly normal. There is mild calcification of the  aortic valve. Aortic valve regurgitation is trivial. No aortic stenosis is  present.   8. The inferior vena cava is normal in size with <50% respiratory  variability, suggesting right atrial pressure of 8 mmHg.   EKG:    EKG Interpretation Date/Time:  Tuesday January 10 2024 10:09:46 EDT Ventricular Rate:  62 PR Interval:  226 QRS Duration:  150 QT Interval:  424 QTC Calculation: 430 R Axis:   53  Text Interpretation: Sinus rhythm with 1st degree A-V block Right bundle branch block When compared with ECG of 29-Apr-2023 10:19, No significant change was found Confirmed by Rashia Mckesson ([id number]) on 01/10/2024 10:17:26 AM         Recent Labs: 05/11/2023: ALT 20 07/03/2023: BUN 29; Creatinine, Ser 1.45; Hemoglobin 10.6; Platelets 185; Potassium 4.1; Sodium 133  Recent Lipid Panel    Component Value Date/Time   CHOL 196 01/26/2023 0507   TRIG 91 01/26/2023 0507   HDL 63 01/26/2023 0507   CHOLHDL 3.1 01/26/2023 0507   VLDL 18 01/26/2023 0507   LDLCALC 115 (H) 01/26/2023 0507   08/31/2021 Cholesterol 163, HDL 69, LDL 82, triglycerides 62  91/79/7974 Cholesterol 142, HDL 81, LDL 47, triglycerides 69  90/75/7974 Hemoglobin 10.2, creatinine 1.02, potassium 4.2, ALT 8  Risk Assessment/Calculations:     CHA2DS2-VASc Score = 5   This indicates a 7.2% annual risk of stroke. The patient's score is based upon: CHF History: 0 HTN History: 1 Diabetes History: 0 Stroke History: 2 Vascular Disease History: 0 Age Score: 2 Gender Score: 0            Physical Exam:  VS:  BP (!) 124/50   Pulse 62   Ht 5' 6 (1.676 m)   Wt 191 lb (86.6 kg)   BMI 30.83 kg/m     Wt Readings from Last 3 Encounters:  01/10/24 191 lb (86.6 kg)  07/03/23 186 lb 8.2 oz (84.6 kg)  05/19/23 186 lb 9.6 oz (84.6 kg)      General: Alert, oriented x3, no distress, appears much younger than stated age.  Borderline obese, but also looks quite muscular and fit Head: no evidence of trauma, PERRL, EOMI, no exophtalmos or lid lag, no myxedema, no xanthelasma; normal ears, nose and oropharynx Neck: normal jugular venous pulsations and no hepatojugular reflux; brisk carotid pulses without delay and no carotid bruits Chest: clear to auscultation, no signs of consolidation by percussion or palpation, normal fremitus, symmetrical and full respiratory excursions Cardiovascular: normal position and quality of the apical impulse, regular rhythm, normal first and widely split second heart sounds, no murmurs, rubs or gallops Abdomen: no tenderness or distention, no masses by palpation, no abnormal pulsatility or arterial bruits, normal bowel sounds, no hepatosplenomegaly Extremities: no clubbing, cyanosis or edema; normal distal pulses.  The MCP and PIP joints bilaterally appear rather thick, but are not red or tender at this time.  Limited flexion in the fingers of both hands. Neurological: grossly nonfocal Psych: Normal mood and affect     ASSESSMENT:    1. Persistent atrial fibrillation (HCC)   2. Essential hypertension   3. Right ventricular dysfunction   4. Acquired thrombophilia   5. Encounter for monitoring flecainide  therapy   6. Rheumatoid arthritis involving multiple sites with positive rheumatoid factor  (HCC)        PLAN:    In order of problems listed above:  AFib: He has not been arrhythmia aware, but there has been no clinically evident recurrence since cardioversion in September 2023.  Successfully maintaining sinus rhythm on flecainide  and metoprolol .  CHA2DS2-VASc 3 on appropriate anticoagulation. HTN: Excellent systolic blood pressure.  Diastolic blood pressure is rather low.  On 4 different antihypertensive medications.  Reduce amlodipine  to 5 mg daily. RV dysfunction: Will reevaluate this with a follow-up echocardiogram.  He does not have dyspnea but has noticed some reduction in his energy level, wonders whether this is because of his low blood pressure.  His echocardiogram in July 2023 when he had atrial fibrillation did show evidence of mildly reduced right ventricular systolic function and mild right ventricular dilation and PA pressure was elevated at roughly 47 mmHg.  He does not have symptoms of sleep apnea or any signs of right heart failure.  He smoked for a while, but quit 40 years ago and does not have typical findings of COPD.  Has RBBB on ECG.  I am not sure that there is a lot of benefit from aggressive workup of pulmonary hypertension, but I think we should follow-up on RV systolic function to see if this has recovered or worsens. Anticoagulation: Denies any overt bleeding problems.  Mild anemia, reportedly has labs showed evidence of iron deficiency although I do not have access to those.  Could also be due to methotrexate .  He is taking folic acid  supplementation. Flecainide : Has not had any evidence of proarrhythmia or toxicity from this drug, so far.  No change in ECG today. History of carpal tunnel release: No other findings to raise suspicion for amyloidosis on his history.  No evidence of infiltrative cardiomyopathy on echocardiogram.  Normal voltage on ECG.  Does not  have symptoms of heart failure. Rheumatoid arthritis: He did a lot better after starting treatment  with methotrexate , but recently has had flareup of his arthritis, with good response to steroid pulse.  Has an upcoming appointment with Dr. Mai.     Medication Adjustments/Labs and Tests Ordered: Current medicines are reviewed at length with the patient today.  Concerns regarding medicines are outlined above.  Orders Placed This Encounter  Procedures   EKG 12-Lead   ECHOCARDIOGRAM COMPLETE   Meds ordered this encounter  Medications   amLODipine  (NORVASC ) 5 MG tablet    Sig: Take 1 tablet (5 mg total) by mouth in the morning.    Dispense:  90 tablet    Refill:  3    Patient Instructions  Medication Instructions:  Decrease Amlodipine  to 5 mg daily *If you need a refill on your cardiac medications before your next appointment, please call your pharmacy*  Lab Work: None ordered If you have labs (blood work) drawn today and your tests are completely normal, you will receive your results only by: MyChart Message (if you have MyChart) OR A paper copy in the mail If you have any lab test that is abnormal or we need to change your treatment, we will call you to review the results.  Testing/Procedures: Your physician has requested that you have an echocardiogram after 01/25/24 (1 year from previous scan). Echocardiography is a painless test that uses sound waves to create images of your heart. It provides your doctor with information about the size and shape of your heart and how well your heart's chambers and valves are working. This procedure takes approximately one hour. There are no restrictions for this procedure. Please do NOT wear cologne, perfume, aftershave, or lotions (deodorant is allowed). Please arrive 15 minutes prior to your appointment time.  Please note: We ask at that you not bring children with you during ultrasound (echo/ vascular) testing. Due to room size and safety concerns, children are not allowed in the ultrasound rooms during exams. Our front office staff  cannot provide observation of children in our lobby area while testing is being conducted. An adult accompanying a patient to their appointment will only be allowed in the ultrasound room at the discretion of the ultrasound technician under special circumstances. We apologize for any inconvenience.   Follow-Up: At [facility], you and your health needs are our priority.  As part of our continuing mission to provide you with exceptional heart care, our providers are all part of one team.  This team includes your primary Cardiologist (physician) and Advanced Practice Providers or APPs (Physician Assistants and Nurse Practitioners) who all work together to provide you with the care you need, when you need it.  Your next appointment:   1 year(s)  Provider:   Jerel Balding, MD    We recommend signing up for the patient portal called MyChart.  Sign up information is provided on this After Visit Summary.  MyChart is used to connect with patients for Virtual Visits (Telemedicine).  Patients are able to view lab/test results, encounter notes, upcoming appointments, etc.  Non-urgent messages can be sent to your provider as well.   To learn more about what you can do with MyChart, go to [email or web address].     Signed, Jerel Balding, MD  01/10/2024 10:45 AM    [facility]

## 2024-01-10 NOTE — Patient Instructions (Addendum)
 Medication Instructions:  Decrease Amlodipine  to 5 mg daily *If you need a refill on your cardiac medications before your next appointment, please call your pharmacy*  Lab Work: None ordered If you have labs (blood work) drawn today and your tests are completely normal, you will receive your results only by: MyChart Message (if you have MyChart) OR A paper copy in the mail If you have any lab test that is abnormal or we need to change your treatment, we will call you to review the results.  Testing/Procedures: Your physician has requested that you have an echocardiogram after 01/25/24 (1 year from previous scan). Echocardiography is a painless test that uses sound waves to create images of your heart. It provides your doctor with information about the size and shape of your heart and how well your heart's chambers and valves are working. This procedure takes approximately one hour. There are no restrictions for this procedure. Please do NOT wear cologne, perfume, aftershave, or lotions (deodorant is allowed). Please arrive 15 minutes prior to your appointment time.  Please note: We ask at that you not bring children with you during ultrasound (echo/ vascular) testing. Due to room size and safety concerns, children are not allowed in the ultrasound rooms during exams. Our front office staff cannot provide observation of children in our lobby area while testing is being conducted. An adult accompanying a patient to their appointment will only be allowed in the ultrasound room at the discretion of the ultrasound technician under special circumstances. We apologize for any inconvenience.   Follow-Up: At [facility], you and your health needs are our priority.  As part of our continuing mission to provide you with exceptional heart care, our providers are all part of one team.  This team includes your primary Cardiologist (physician) and Advanced Practice Providers or APPs (Physician  Assistants and Nurse Practitioners) who all work together to provide you with the care you need, when you need it.  Your next appointment:   1 year(s)  Provider:   Jerel Balding, MD    We recommend signing up for the patient portal called MyChart.  Sign up information is provided on this After Visit Summary.  MyChart is used to connect with patients for Virtual Visits (Telemedicine).  Patients are able to view lab/test results, encounter notes, upcoming appointments, etc.  Non-urgent messages can be sent to your provider as well.   To learn more about what you can do with MyChart, go to [email or web address].

## 2024-02-03 DIAGNOSIS — D509 Iron deficiency anemia, unspecified: Secondary | ICD-10-CM | POA: Diagnosis not present

## 2024-02-22 ENCOUNTER — Ambulatory Visit (HOSPITAL_COMMUNITY)
Admission: RE | Admit: 2024-02-22 | Discharge: 2024-02-22 | Disposition: A | Discharge status: Home / Self Care | Source: Ambulatory Visit | Attending: Internal Medicine | Admitting: Internal Medicine

## 2024-02-22 DIAGNOSIS — I119 Hypertensive heart disease without heart failure: Secondary | ICD-10-CM | POA: Insufficient documentation

## 2024-02-22 DIAGNOSIS — I080 Rheumatic disorders of both mitral and aortic valves: Secondary | ICD-10-CM | POA: Insufficient documentation

## 2024-02-22 DIAGNOSIS — E785 Hyperlipidemia, unspecified: Secondary | ICD-10-CM | POA: Diagnosis not present

## 2024-02-22 DIAGNOSIS — R9431 Abnormal electrocardiogram [ECG] [EKG]: Secondary | ICD-10-CM | POA: Insufficient documentation

## 2024-02-22 DIAGNOSIS — I519 Heart disease, unspecified: Secondary | ICD-10-CM

## 2024-02-22 DIAGNOSIS — I4891 Unspecified atrial fibrillation: Secondary | ICD-10-CM | POA: Insufficient documentation

## 2024-02-22 DIAGNOSIS — I340 Nonrheumatic mitral (valve) insufficiency: Secondary | ICD-10-CM

## 2024-02-22 DIAGNOSIS — Z8673 Personal history of transient ischemic attack (TIA), and cerebral infarction without residual deficits: Secondary | ICD-10-CM | POA: Insufficient documentation

## 2024-02-22 DIAGNOSIS — Z87891 Personal history of nicotine dependence: Secondary | ICD-10-CM | POA: Insufficient documentation

## 2024-02-22 DIAGNOSIS — I4510 Unspecified right bundle-branch block: Secondary | ICD-10-CM | POA: Diagnosis not present

## 2024-02-22 LAB — ECHOCARDIOGRAM COMPLETE
Area-P 1/2: 2.69 cm2
MV M vel: 5.4 m/s
MV Peak grad: 116.6 mmHg
MV VTI: 1.86 cm2
S' Lateral: 2.2 cm

## 2024-02-23 ENCOUNTER — Ambulatory Visit: Payer: Self-pay | Admitting: Cardiovascular Disease

## 2024-02-23 DIAGNOSIS — I272 Pulmonary hypertension, unspecified: Secondary | ICD-10-CM

## 2024-02-23 DIAGNOSIS — I5030 Unspecified diastolic (congestive) heart failure: Secondary | ICD-10-CM

## 2024-02-23 DIAGNOSIS — R931 Abnormal findings on diagnostic imaging of heart and coronary circulation: Secondary | ICD-10-CM

## 2024-02-24 ENCOUNTER — Encounter: Payer: Self-pay | Admitting: Cardiovascular Disease

## 2024-02-24 DIAGNOSIS — Z6830 Body mass index (BMI) 30.0-30.9, adult: Secondary | ICD-10-CM | POA: Diagnosis not present

## 2024-02-24 DIAGNOSIS — E669 Obesity, unspecified: Secondary | ICD-10-CM | POA: Diagnosis not present

## 2024-02-24 DIAGNOSIS — M353 Polymyalgia rheumatica: Secondary | ICD-10-CM | POA: Diagnosis not present

## 2024-02-24 DIAGNOSIS — Z79899 Other long term (current) drug therapy: Secondary | ICD-10-CM | POA: Diagnosis not present

## 2024-02-24 DIAGNOSIS — M0579 Rheumatoid arthritis with rheumatoid factor of multiple sites without organ or systems involvement: Secondary | ICD-10-CM | POA: Diagnosis not present

## 2024-02-24 DIAGNOSIS — M79642 Pain in left hand: Secondary | ICD-10-CM | POA: Diagnosis not present

## 2024-02-24 DIAGNOSIS — W57XXXA Bitten or stung by nonvenomous insect and other nonvenomous arthropods, initial encounter: Secondary | ICD-10-CM | POA: Diagnosis not present

## 2024-02-24 DIAGNOSIS — M170 Bilateral primary osteoarthritis of knee: Secondary | ICD-10-CM | POA: Diagnosis not present

## 2024-02-24 DIAGNOSIS — Z111 Encounter for screening for respiratory tuberculosis: Secondary | ICD-10-CM | POA: Diagnosis not present

## 2024-02-24 NOTE — Telephone Encounter (Signed)
 Order placed for PYP scan. Mycart message sent.

## 2024-02-27 ENCOUNTER — Telehealth (HOSPITAL_COMMUNITY): Payer: Self-pay

## 2024-02-27 ENCOUNTER — Other Ambulatory Visit: Payer: Self-pay | Admitting: Cardiovascular Disease

## 2024-02-27 DIAGNOSIS — I272 Pulmonary hypertension, unspecified: Secondary | ICD-10-CM

## 2024-02-27 DIAGNOSIS — R931 Abnormal findings on diagnostic imaging of heart and coronary circulation: Secondary | ICD-10-CM

## 2024-02-27 DIAGNOSIS — I5030 Unspecified diastolic (congestive) heart failure: Secondary | ICD-10-CM

## 2024-02-27 NOTE — Telephone Encounter (Signed)
 Detailed instructions left on the patient's answering machine. Jonathan Woodward CCT

## 2024-02-28 ENCOUNTER — Ambulatory Visit (HOSPITAL_COMMUNITY)
Admission: RE | Admit: 2024-02-28 | Discharge: 2024-02-28 | Disposition: A | Discharge status: Home / Self Care | Source: Ambulatory Visit | Attending: Cardiovascular Disease | Admitting: Cardiovascular Disease

## 2024-02-28 DIAGNOSIS — I5030 Unspecified diastolic (congestive) heart failure: Secondary | ICD-10-CM | POA: Diagnosis not present

## 2024-02-28 DIAGNOSIS — R931 Abnormal findings on diagnostic imaging of heart and coronary circulation: Secondary | ICD-10-CM | POA: Insufficient documentation

## 2024-02-28 DIAGNOSIS — I272 Pulmonary hypertension, unspecified: Secondary | ICD-10-CM | POA: Diagnosis not present

## 2024-02-28 LAB — MYOCARDIAL AMYLOID PLANAR & SPECT: H/CL Ratio: 1.13

## 2024-02-28 MED ORDER — TECHNETIUM TC 99M PYROPHOSPHATE
21.8000 | Freq: Once | INTRAVENOUS | Status: AC
  Administered 2024-02-28: 21.8 via INTRAVENOUS

## 2024-02-29 ENCOUNTER — Ambulatory Visit: Payer: Self-pay | Admitting: Cardiovascular Disease

## 2024-04-26 ENCOUNTER — Encounter: Payer: Self-pay | Admitting: Cardiovascular Disease
